# Patient Record
Sex: Male | Born: 1935
Health system: Southern US, Community
[De-identification: ages and names within clinical notes are randomized; demographics above are authoritative.]

## PROBLEM LIST (undated history)

## (undated) ENCOUNTER — Emergency Department (HOSPITAL_COMMUNITY): Admission: EM | Payer: Medicare Other | Source: Home / Self Care

## (undated) DIAGNOSIS — G478 Other sleep disorders: Secondary | ICD-10-CM

## (undated) DIAGNOSIS — J309 Allergic rhinitis, unspecified: Secondary | ICD-10-CM

## (undated) DIAGNOSIS — T8859XA Other complications of anesthesia, initial encounter: Secondary | ICD-10-CM

## (undated) DIAGNOSIS — E7800 Pure hypercholesterolemia, unspecified: Secondary | ICD-10-CM

## (undated) DIAGNOSIS — F419 Anxiety disorder, unspecified: Secondary | ICD-10-CM

## (undated) DIAGNOSIS — H5010 Unspecified exotropia: Secondary | ICD-10-CM

## (undated) DIAGNOSIS — Z8679 Personal history of other diseases of the circulatory system: Secondary | ICD-10-CM

## (undated) DIAGNOSIS — G8929 Other chronic pain: Secondary | ICD-10-CM

## (undated) DIAGNOSIS — M5431 Sciatica, right side: Secondary | ICD-10-CM

## (undated) DIAGNOSIS — E119 Type 2 diabetes mellitus without complications: Principal | ICD-10-CM

## (undated) DIAGNOSIS — Z96659 Presence of unspecified artificial knee joint: Secondary | ICD-10-CM

## (undated) DIAGNOSIS — N35919 Unspecified urethral stricture, male, unspecified site: Secondary | ICD-10-CM

## (undated) DIAGNOSIS — I10 Essential (primary) hypertension: Secondary | ICD-10-CM

## (undated) DIAGNOSIS — Z8719 Personal history of other diseases of the digestive system: Secondary | ICD-10-CM

## (undated) DIAGNOSIS — N401 Enlarged prostate with lower urinary tract symptoms: Secondary | ICD-10-CM

## (undated) DIAGNOSIS — M1990 Unspecified osteoarthritis, unspecified site: Secondary | ICD-10-CM

## (undated) DIAGNOSIS — E785 Hyperlipidemia, unspecified: Secondary | ICD-10-CM

## (undated) DIAGNOSIS — T4145XA Adverse effect of unspecified anesthetic, initial encounter: Secondary | ICD-10-CM

## (undated) DIAGNOSIS — M1712 Unilateral primary osteoarthritis, left knee: Secondary | ICD-10-CM

## (undated) HISTORY — PX: TONSILLECTOMY: SUR1361

## (undated) HISTORY — DX: Pure hypercholesterolemia, unspecified: E78.00

## (undated) HISTORY — PX: LUMBAR SPINE SURGERY: SHX701

## (undated) HISTORY — PX: TONSILLECTOMY AND ADENOIDECTOMY: SUR1326

## (undated) HISTORY — DX: Type 2 diabetes mellitus without complications: E11.9

## (undated) HISTORY — DX: Unilateral primary osteoarthritis, left knee: M17.12

## (undated) HISTORY — PX: SHOULDER ARTHROSCOPY: SHX128

## (undated) HISTORY — PX: KNEE ARTHROSCOPY: SUR90

## (undated) HISTORY — PX: TOTAL KNEE ARTHROPLASTY: SHX125

## (undated) HISTORY — PX: NISSEN FUNDOPLICATION: SHX2091

## (undated) HISTORY — PX: APPENDECTOMY: SHX54

## (undated) HISTORY — DX: Essential (primary) hypertension: I10

## (undated) HISTORY — PX: LAPAROSCOPIC NISSEN FUNDOPLICATION: SHX1932

## (undated) HISTORY — PX: CATARACT EXTRACTION W/ INTRAOCULAR LENS  IMPLANT, BILATERAL: SHX1307

## (undated) HISTORY — PX: CLOSED REDUCTION GREATER HUMERAL TUBEROSITY FRACTURE: SUR219

## (undated) HISTORY — DX: Presence of unspecified artificial knee joint: Z96.659

## (undated) HISTORY — PX: JOINT REPLACEMENT: SHX530

## (undated) HISTORY — PX: NASAL SEPTOPLASTY W/ TURBINOPLASTY: SHX2070

---

## 2000-05-23 ENCOUNTER — Ambulatory Visit (HOSPITAL_COMMUNITY): Admission: RE | Admit: 2000-05-23 | Discharge: 2000-05-23 | Payer: Self-pay | Admitting: Gastroenterology

## 2000-07-17 ENCOUNTER — Encounter: Admission: RE | Admit: 2000-07-17 | Discharge: 2000-07-17 | Payer: Self-pay | Admitting: Gastroenterology

## 2000-07-17 ENCOUNTER — Encounter: Payer: Self-pay | Admitting: Gastroenterology

## 2000-08-07 ENCOUNTER — Ambulatory Visit (HOSPITAL_COMMUNITY): Admission: RE | Admit: 2000-08-07 | Discharge: 2000-08-07 | Payer: Self-pay | Admitting: Gastroenterology

## 2000-10-04 ENCOUNTER — Ambulatory Visit (HOSPITAL_COMMUNITY): Admission: RE | Admit: 2000-10-04 | Discharge: 2000-10-04 | Payer: Self-pay | Admitting: Surgery

## 2000-10-04 ENCOUNTER — Encounter: Payer: Self-pay | Admitting: Surgery

## 2000-10-31 ENCOUNTER — Encounter: Payer: Self-pay | Admitting: Surgery

## 2000-11-07 ENCOUNTER — Inpatient Hospital Stay (HOSPITAL_COMMUNITY): Admission: RE | Admit: 2000-11-07 | Discharge: 2000-11-09 | Payer: Self-pay | Admitting: Surgery

## 2006-03-12 ENCOUNTER — Encounter: Admission: RE | Admit: 2006-03-12 | Discharge: 2006-03-12 | Payer: Self-pay | Admitting: Orthopedic Surgery

## 2006-03-19 ENCOUNTER — Ambulatory Visit (HOSPITAL_BASED_OUTPATIENT_CLINIC_OR_DEPARTMENT_OTHER): Admission: RE | Admit: 2006-03-19 | Discharge: 2006-03-19 | Payer: Self-pay | Admitting: Orthopedic Surgery

## 2007-05-13 ENCOUNTER — Inpatient Hospital Stay (HOSPITAL_COMMUNITY): Admission: RE | Admit: 2007-05-13 | Discharge: 2007-05-15 | Payer: Self-pay | Admitting: Orthopedic Surgery

## 2007-05-13 HISTORY — PX: TOTAL KNEE ARTHROPLASTY: SHX125

## 2008-03-24 HISTORY — PX: BACK SURGERY: SHX140

## 2008-03-30 ENCOUNTER — Inpatient Hospital Stay (HOSPITAL_COMMUNITY): Admission: EM | Admit: 2008-03-30 | Discharge: 2008-04-13 | Payer: Self-pay | Admitting: Emergency Medicine

## 2008-03-31 ENCOUNTER — Encounter (INDEPENDENT_AMBULATORY_CARE_PROVIDER_SITE_OTHER): Payer: Self-pay | Admitting: Internal Medicine

## 2008-04-07 ENCOUNTER — Ambulatory Visit: Payer: Self-pay | Admitting: Physical Medicine & Rehabilitation

## 2008-05-19 ENCOUNTER — Encounter: Admission: RE | Admit: 2008-05-19 | Discharge: 2008-05-19 | Payer: Self-pay | Admitting: Neurological Surgery

## 2008-06-01 ENCOUNTER — Encounter: Admission: RE | Admit: 2008-06-01 | Discharge: 2008-06-01 | Payer: Self-pay | Admitting: Neurological Surgery

## 2008-11-17 ENCOUNTER — Encounter: Admission: RE | Admit: 2008-11-17 | Discharge: 2008-11-17 | Payer: Self-pay | Admitting: Neurological Surgery

## 2009-07-06 ENCOUNTER — Emergency Department (HOSPITAL_COMMUNITY): Admission: EM | Admit: 2009-07-06 | Discharge: 2009-07-06 | Payer: Self-pay | Admitting: Emergency Medicine

## 2009-07-20 ENCOUNTER — Ambulatory Visit (HOSPITAL_COMMUNITY): Admission: RE | Admit: 2009-07-20 | Discharge: 2009-07-21 | Payer: Self-pay | Admitting: Orthopedic Surgery

## 2009-07-20 HISTORY — PX: OTHER SURGICAL HISTORY: SHX169
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
## 2010-07-17 LAB — BASIC METABOLIC PANEL
BUN: 17 mg/dL (ref 6–23)
BUN: 9 mg/dL (ref 6–23)
CO2: 24 mEq/L (ref 19–32)
CO2: 24 mEq/L (ref 19–32)
Calcium: 8.1 mg/dL — ABNORMAL LOW (ref 8.4–10.5)
Chloride: 106 mEq/L (ref 96–112)
Creatinine, Ser: 0.89 mg/dL (ref 0.4–1.5)
GFR calc non Af Amer: >60 mL/min (ref >60)
Glucose, Bld: 172 mg/dL — ABNORMAL HIGH (ref 70–99)
Glucose, Bld: 191 mg/dL — ABNORMAL HIGH (ref 70–99)
Potassium: 5 mEq/L (ref 3.5–5.1)

## 2010-07-17 LAB — CBC
HCT: 40.5 % (ref 39.0–52.0)
Hemoglobin: 11.9 g/dL — ABNORMAL LOW (ref 13.0–17.0)
MCHC: 33.8 g/dL (ref 30.0–36.0)
MCHC: 34.4 g/dL (ref 30.0–36.0)
MCV: 91.5 fL (ref 78.0–100.0)
Platelets: 181 10*3/uL (ref 150–400)
Platelets: 199 10*3/uL (ref 150–400)
RDW: 13.7 % (ref 11.5–15.5)
RDW: 13.9 % (ref 11.5–15.5)

## 2010-07-17 LAB — GLUCOSE, CAPILLARY: Glucose-Capillary: 164 mg/dL — ABNORMAL HIGH (ref 70–99)
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
## 2010-09-06 NOTE — Op Note (Signed)
Woodard, [name] NO.:  [id number]   MEDICAL RECORD NO.:  [id number]          PATIENT TYPE:  INP   LOCATION:  2899                         FACILITY:  [facility]   PHYSICIAN:  Elana Alm. Thurston Hole, M.D. DATE OF BIRTH:  March 14, 1932   DATE OF PROCEDURE:  05/13/2007  DATE OF DISCHARGE:                               OPERATIVE REPORT   PREOPERATIVE DIAGNOSIS:  Right knee degenerative joint disease.   POSTOPERATIVE DIAGNOSIS:  Right knee degenerative joint disease.   PROCEDURE:  Right total knee replacement using Dupuy cemented total knee  system with #5 cemented femur, #6 cemented tibia with 10 mm polyethylene  RP tibial spacer and 38 mm polyethylene cemented patella.   SURGEON:  Elana Alm. Thurston Hole, M.D.   ASSISTANT:  Julien Girt, P.A.   ANESTHESIA:  General anesthesia.   OPERATIVE TIME:  One hour and 20 minutes.   COMPLICATIONS:  None.   DESCRIPTION OF PROCEDURE:  [name] was brought to the operating room on  May 13, 2007, after a femoral nerve block was placed in the holding  room by anesthesia.  He was placed on the operating room table in the  supine position.  After being placed under general anesthesia, he had a  Foley catheter placed under sterile conditions.  He received Ancef 1  gram IV preoperatively for prophylaxis.  His right leg was then prepped  using sterile DuraPrep and draped using a sterile technique.  The leg  was exsanguinated and a thigh tourniquet elevated to 365 mm.  Initially  through a 15 cm longitudinal incision based over the patella, initial  exposure was made.  The underlying subcutaneous tissues were incised,  along with the skin incision.  A median arthrotomy was performed,  revealing an excessive amount of normal-appearing joint fluid.  The  articular surfaces were inspected.  He had grade 4 changes medially,  laterally and in the patellofemoral joint.  Osteophytes were removed  from the femoral condyles and tibial plateau.   The medial and lateral  meniscal remnants were removed, as well as the anterior cruciate  ligament.  An intra-medullary drill was then drilled up the femoral  canal, for placement of the distal femoral cutting jig, which was placed  in the appropriate amount of rotation and a distal 13 mm cut was made in  the appropriate amount of external rotation.  The distal femur was then  sized.  A #5 was found to be the appropriate size.  A #5 cutting jig was  placed in the appropriate amount of external rotation and then these  cuts were made.  The proximal tibia was then exposed.  The tibial spines  were removed with an oscillating saw.  An intra-medullary drill was  drilled down the tibial canal for placement of the proximal tibial  cutting jig which was placed in the appropriate amount of rotation and a  proximal 6 mm cut was made based off the medial or lower side.  Spacer  blocks were then placed in flexion and extension.  The 10 mm blocks gave  excellent balancing, excellent stability  and excellent correction of  selection in varus deformities.  Preoperative range of motion had been  from -5 to 125 degrees with mild varus deformity, normal patella  tracking.  His knee had been stable preoperatively.  The spacer blocks  showed excellent balancing and excellent correction of his flexion and  varus deformities.  The #6 trial tibial base plate was placed on the cut  tibial surface.  This gave an excellent fit and a keel cut was made.  A  #5 PCL box cutter was then placed on the distal femur and then these  cuts were made.  At this point the #5 femoral trial was placed and with  the #6 tibial base plate trial, and a 10 mm polyethylene tibial spacer,  the knee was reduced and taken through a range of motion from 0-125  degrees, with excellent stability and excellent correction of his  flexion and varus deformities and normal patella tracking.  A  resurfacing 10 mm cut was made for the patella and  then three locking  holes placed for a 38 mm patella.  The patella trial was placed and then  patellofemoral tracking was re-evaluated and found to be normal.  At  this point, it was felt that all the trial components were of excellent  size, fit and stability.  They were then removed and the knee was then  jet lavaged, irrigated with 3 liters of saline.  The proximal tibia was  then exposed and a #6 tibial base plate with cement backing was hammered  into position, with an excellent fit, with excess cement being removed  from around the edges.  A #5 femoral component with cement backing was  hammered into position, also with an excellent fit, with excess cement  being removed from around the edges.  The 10 mm polyethylene RP tibial  spacer was placed on the tibial base plate and the knee reduced and  taken through a range of motion from 0-125 degrees, with excellent  stability and excellent correction of his flexion and varus deformities.  The 38 mm polyethylene cement backed patella was then placed in its  position and held there with a clamp.  After the cement had hardened,  again the patellofemoral tracking was evaluated and found to be normal.  At this point it was felt that all the components were of excellent  size, fit and stability.  The patient had a large calcified tibial  tubercle and a portion of this was removed with the rongeurs and an  osteotome, preserving the patellar tendon attachment.  At this point,  then the tourniquet was released.  Hemostasis was obtained with the  cautery.  The wound was then re-irrigated with saline and then the  arthrotomy was closed with #1 Ethibond suture over two medium Hemovac  drains.  The area of the tibial tubercle calcific exostectomy was closed  with #0 Vicryl with a small amount of bone wax being placed over this  exostosis area.  The subcutaneous tissues were closed with #0 and #2-0  Vicryl.  The subcuticular layer was closed with #4-0  Monocryl.  Sterile  dressings and a long-leg splint applied.   The patient was the awakened, extubated and taken to the recovery room  in stable condition.  The needle and sponge counts were correct x2 at  the end of the case.  Neurovascular status normal postoperatively.      Robert A. Thurston Hole, M.D.  Electronically Signed     RAW/MEDQ  D:  05/13/2007  T:  05/13/2007  Job:  [id number]
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
## 2010-09-09 NOTE — Procedures (Signed)
[place]. [facility]  Patient:    [name], [name]                         MRN: [id number] Proc. Date: 08/07/00 Adm. Date:  [id number] Disc. Date: [id number] Attending:  Orland Mustard CC:         Lennon Alstrom. Felipa Eth, M.D.   Procedure Report  PROCEDURE PERFORMED:  Esophageal manometry.  ENDOSCOPIST:  Llana Aliment. Randa Evens, M.D.  INDICATIONS FOR PROCEDURE:  Persistent problems with esophageal reflux symptoms.  This is done to evaluate the esophageal motility and possible anticipation of an antireflux operation.  DESCRIPTION OF PROCEDURE:  The procedure was performed off of medications.  He has this performed at [facility]. [facility] manometry lab in the usual manner.  The [place] system was used. Results are as follows.  1 - Upper esophageal sphincter is poorly seen.  Pharyngeal spikes are present. 2 - Esophageal body.  Peristalsis is normal.  Mean pressure in 10 swallows is     138 mm.  Mean duration is 4.9 seconds.  The motility appeared normal on     the wet swallows presented for evaluation. 3 - Lower esophageal sphincter.  28 mm average pressure does appear to relax     with swallowing.  Was measured at 73% relaxation.  IMPRESSION:  Essentially normal manometry.  PLAN:   Will proceed with 24-hour pH probe at this time. DD:  08/23/00 TD:  08/23/00 Job: [id number]

## 2010-09-09 NOTE — Discharge Summary (Signed)
[facility]  Patient:    [name], [name]                         MRN: [id number] Adm. Date:  [id number] Disc. Date: [id number] Attending:  Katha Cabal CC:         Lennon Alstrom. Felipa Eth, M.D.  Celestine L. Randa Evens, M.D.   Discharge Summary  ADMISSION DIAGNOSIS:  Gastroesophageal reflux disease.  PROCEDURES:  July 17, laparoscopic Nissen fundoplication over #50 dilator with closure of his diaphragm.  COURSE IN HOSPITAL:  [name] is a 77 year old gentleman with significant nocturnal reflux.  He underwent the aforementioned laparoscopic Nissen and did well.  He was ready for discharge on July 19 at which time he was given ______ and Phenergan to take and was asked to return to the office in three weeks.  FINAL DIAGNOSIS:  Gastroesophageal reflux disease status post laparoscopic Nissen. DD:  11/23/00 TD:  11/23/00 Job: [id number]

## 2010-09-09 NOTE — Procedures (Signed)
[place]. [facility]  Patient:    [name], [name]                         MRN: [id number] Proc. Date: 08/07/00 Adm. Date:  [id number] Disc. Date: [id number] Attending:  Orland Mustard CC:         Lennon Alstrom. Felipa Eth, M.D.   Procedure Report  PROCEDURE PERFORMED:  24-hour pH monitoring.  ENDOSCOPIST:  Llana Aliment. Randa Evens, M.D.  INDICATIONS FOR PROCEDURE:  Patient with chest pain with some reflux symptoms. This procedure is done to determine the degree of reflux.  The patient had manometry done just before this and then had the [facility] 24-hour ambulatory pH probe placed.  Duration of the test was 23 hours and 34 minutes.  The results of the distal channel revealed reflux 4.9% of the time in the upright position and 5% of the time in the recumbent position.  There was one episode greater than five minutes. Total reflux time was 4.9%, only slightly above limits of normal.  Composite  score calculated was 33.9 with normal being less than 22.  IMPRESSION:  Mild esophageal reflux.  The patient had this done after being off the medication two to three days but this still did show reflux.  I think this ought to be the amount of reflux that could be adequately controlled with proton pump inhibitors.  PLAN:  Will double his proton pump inhibitor and see back in the office in the next several weeks. DD:  08/23/00 TD:  08/23/00 Job: [id number]

## 2010-09-09 NOTE — Op Note (Signed)
NAME:  [name], [name]                  ACCOUNT NO.:  [id number]   MEDICAL RECORD NO.:  [id number]          PATIENT TYPE:  AMB   LOCATION:  DSC                          FACILITY:  [facility]   PHYSICIAN:  Robert A. Thurston Hole, M.D. DATE OF BIRTH:  03-10-1932   DATE OF PROCEDURE:  03/20/2006  DATE OF DISCHARGE:                               OPERATIVE REPORT   PREOPERATIVE DIAGNOSIS:  Right knee medial and lateral meniscal tears  with chondromalacia and synovitis.   POSTOPERATIVE DIAGNOSIS:  Right knee medial and lateral meniscal tears  with chondromalacia and synovitis.   PROCEDURE:  1. Right knee EUA followed by arthroscopic partial medial lateral      meniscectomies.  2. Right knee chondroplasty with partial synovectomy.   SURGEON:  Gerrit Friends. Aldona Bar, M.D.   ASSISTANT:  None.   ANESTHESIA:  General.   OPERATIVE TIME:  30 minutes.   COMPLICATIONS:  None.   INDICATIONS FOR PROCEDURE:  [name] is a 77 year old gentleman who has  had 6 months of increasing right knee pain with exam and MRI documenting  meniscal tearing with chondromalacia and synovitis.  He has failed  conservative care and is now to undergo arthroscopy.   DESCRIPTION:  [name] was brought to operating room on March 19, 2006 and placed on the operative table in supine position.  After being  placed under general anesthesia, his right knee was examined.  He had  full range of motion with 1 to 2+ crepitation, knee stable to  ligamentous exam with normal patellar tracking.  The knee was sterilely  injected with 0.2% Marcaine with epinephrine.  The right leg was prepped  using sterile DuraPrep and draped using sterile technique.  Originally  through an anterolateral portal, the arthroscope with a pump attached  was placed and through an anteromedial portal an arthroscopic probe was  placed.  On initial inspection medial compartment he had 50-60% grade 3  chondromalacia which was debrided.  Medial meniscus tear 50-60%  posterior medial horn which was resected back to stable rim.  Intercondylar notch was inspected.  Anterior posterior cruciate  ligaments were normal.  Lateral compartment inspected, articular  cartilage showed 30-40% grade 3 chondromalacia which was debrided.  Lateral meniscus tear, posterior lateral and anterior horn of which 50%  was resected back to stable rim.  Patellofemoral joint showed 50% grade  3 chondromalacia which was debrided.  The patella tracked normally.  Moderate synovitis in medial lateral gutters were debrided otherwise  they are free of pathology.  After this was done, it is felt that all  pathology been satisfactorily addressed.  The instruments were removed.  Portals closed with 3-0 nylon suture and injected 0.25% Marcaine with  epinephrine and 4 mg morphine.  Sterile dressings applied.  The patient  awakened and taken to recovery room in stable condition.   FOLLOW-UP CARE:  [name] will be followed outpatient on Vicodin and  Naprosyn.  See him back in the office in a week for sutures out follow-  up.      Robert A. Thurston Hole, M.D.  Electronically Signed  RAW/MEDQ  D:  03/20/2006  T:  03/20/2006  Job:  [id number]

## 2010-09-09 NOTE — Procedures (Signed)
[facility]  Patient:    [name], [name]                         MRN: [id number] Proc. Date: 05/23/00 Adm. Date:  [id number] Attending:  Orland Mustard CC:         Lennon Alstrom. Felipa Eth, M.D.   Procedure Report  PROCEDURE:  Esophagogastroduodenoscopy.  MEDICATIONS:  Cetacaine spray, fentanyl 50 mcg, Versed 6 mg IV.  INDICATIONS:  Persistent esophageal reflux symptoms refractory to treatment with Nexium with nocturnal awakening and regurgitation.  INFORMED CONSENT:  The procedure had been explained to the patient and consent obtained.  DESCRIPTION OF PROCEDURE:  With the patient in the left lateral decubitus position, the Olympus adult video endoscope was inserted blindly into the esophagus and advanced under direct visualization.  The stomach was entered, pylorus identified and passed.  The duodenum, including the bulb and second portion, seen well and were unremarkable.  The scope was withdrawn back into the stomach, and the port channel, antrum were normal.  The fundus and cardia were seen well on retroflexed view and were normal.  There was a 2-3 cm hiatal hernia with a patulous GE junction.  There was no evidence of Barretts esophagus.  The Z-line was well defined.  The distal esophagus was free of inflammation or ulceration.  The proximal esophagus was seen well and was normal.  The scope was withdrawn, and the patient tolerated the procedure well.  ASSESSMENT:  Hiatal hernia with esophageal reflux.  PLAN:  We will continue Nexium.  We will add Reglan for his prokinetic effects. We will see back in the office.  We will also proceed with a colonoscopy at this time, as planned. DD:  05/23/00 TD:  05/23/00 Job: [id number]

## 2010-09-09 NOTE — Discharge Summary (Signed)
[name], [name]                  ACCOUNT NO.:  [id number]   MEDICAL RECORD NO.:  [id number]          PATIENT TYPE:  INP   LOCATION:  3018                         FACILITY:  [facility]   PHYSICIAN:  Larina Earthly, M.D.        DATE OF BIRTH:  Oct 02, 1931   DATE OF ADMISSION:  03/30/2008  DATE OF DISCHARGE:  04/13/2008                               DISCHARGE SUMMARY   ADDENDUM:  Please see the discharge summary dictated on April 08, 2008.   The patient's discharge originally scheduled for April 08, 2008 was  delayed or deferred secondary to increasing confusion and disorientation  on the day of planned discharge that was quite different from the day  prior to discharge.  Given the patient's recent surgery, head CT was  obtained which was negative for any acute changes.  Urinalysis was  obtained that was questionably positive and possibly contaminated by  epithelial cells.  Gram-stain did reveal Gram-positive cocci and  increased white blood cells.  The patient was empirically started on  Ancef by Neurosurgery and this was later changed over to Septra DS.  Diabetes and hypertension remained stable.  The patient continued to  work with physical and occupational therapy and his altered mental  status slowly improved to where he was orienting very easily but  confused at times.  When evaluated on April 13, 2008, the patient was  adamant that he go home and he was appropriate.  His notes with physical  therapy were reviewed and he was amendable to their discussion about  keeping safety with the use of a walker.  His vital signs were stable.  Diabetes was controlled, but somewhat labile on a sliding-scale insulin.  It is still a question whether his altered mental status was caused by  the longstanding effects and metabolites of pain medications and  anesthesia which he reported happening during previous surgeries versus  a questionable urinary tract infection.  His discharge medications  have  remained stable with the exception of adding Septra DS one p.o. b.i.d.  times a week with close followup on an outpatient basis as well as  discharged with Home Health physical and occupational therapy on  April 13, 2008.      Larina Earthly, M.D.  Electronically Signed     RA/MEDQ  D:  06/09/2008  T:  06/10/2008  Job:  [id number]

## 2010-09-09 NOTE — Discharge Summary (Signed)
NAME:  [name], [name] NO.:  [id number]   MEDICAL RECORD NO.:  [id number]          PATIENT TYPE:  INP   LOCATION:  5018                         FACILITY:  [facility]   PHYSICIAN:  Elana Alm. Thurston Hole, M.D. DATE OF BIRTH:  Dec 10, 1931   DATE OF ADMISSION:  05/13/2007  DATE OF DISCHARGE:  05/15/2007                               DISCHARGE SUMMARY   ADMITTING DIAGNOSES:  1. End-stage DJD right knee.  2. High cholesterol.   DISCHARGE DIAGNOSES:  1. End-stage DJD right knee.  2. High cholesterol.  3. Postop blood loss anemia.  4. Gastroesophageal reflux.   HISTORY OF PRESENT ILLNESS:  The patient is a 77 year old white male  with a history of end-stage DJD of his right knee.  He has failed  arthroscopic debridement and articular cortisone injections and anti-  inflammatories.  Risk, benefits and possible complications of a total  knee have been discussed with him and his wife.  They are without  question.   PROCEDURES IN HOUSE:  On May 13, 2007, the patient underwent a right  total knee replacement by Dr. Thurston Hole, a right femoral nerve block by  anesthesia and a postop auto vac transfusion in the recovery room.  He  tolerated all the procedures well and was placed on a CPM zero to 90  degrees.  Postop day #1, his hemoglobin was 12.4.  He was metabolically  stable with a slightly low sodium and slightly high glucose, but  completely asymptomatic with these he was neurovascularly intact and  progressed well with physical therapy.  Postop day #1, however he got up  to the sink with his wife's assistance, lost his balance, and gently  fell to the floor.  He was lifted back into bed.  Surgical wound was  well-approximated.  He had no new bruising, no new pain.  His blood  pressure was 94/61.  His pulse was 87.  His respirations were 18.  His  O2 sat was 85% on room air.  A stat H&H was done which showed his  hemoglobin to be stable with his hemoglobin being 11.8 and  hematocrit  34.9.  He continued to do well despite his dizziness postop day  #2.  The patient significantly improved complaining of no pain from his fall  on postop day #1, no dizziness.  He is alert but did have some confusion  overnight.  His wife stated that he usually got confused whenever he was  hospitalized in the evening time.  Postop day #2, patient ambulated 120  feet was supervision with his rolling walker with minimal complaints.  His IV was discontinued so he was given a Dulcolax suppository.  He was  discharged to home in stable condition, weightbearing as tolerated, on a  regular diet.   DISCHARGE MEDICATIONS:  1. Hydrocodone 10/325 one tablet q.4 h p.r.n. pain.  2. Robaxin 500 mg one q.6 h p.r.n. muscle spasm.  3. Xanax 0.25 mg one tablet q.8 h p.r.n. anxiety.  4. Coumadin 5 mg one and a half tablets every evening.  5. Lovenox 60 mg one injection  daily until his INR is 2.0 or higher.  6. Simvastatin 20 mg daily.  7. Aspirin 81 mg daily.   FOLLOWUP:  He will follow up with Dr. Thurston Hole on May 27, 2007.  He  will call with increased pain, increased redness, increased swelling or  increased drainage.      Kirstin Shepperson, P.A.      Robert A. Thurston Hole, M.D.  Electronically Signed    KS/MEDQ  D:  06/21/2007  T:  06/22/2007  Job:  [id number]

## 2010-09-09 NOTE — Procedures (Signed)
[facility]  Patient:    [name], [name]                         MRN: [id number] Proc. Date: 05/23/00 Adm. Date:  [id number] Attending:  Orland Mustard CC:         Lennon Alstrom. Felipa Eth, M.D.   Procedure Report  PROCEDURE:  Colonoscopy.  MEDICATIONS:  The patient received a total of fentanyl 50 mcg, Versed 6 mg for both procedures.  SCOPE:  Olympus adult video colonoscope.  INDICATIONS:  Colon cancer screening.  DESCRIPTION OF PROCEDURE:  Following the endoscopy, the patient was turned around, and a digital exam was performed of the rectum.  The Olympus adult video colonoscope was inserted and advanced under direct visualization.  The prep was marginal.  There were some areas of some very sticky stool.  We were able to advance to the hepatic flexure and after multiple position changes, were able to get around the hepatic flexure down into the ascending colon. The patient had a very large atonic colon, a very large atonic abdomen. Despite multiple maneuvers, we were never able to completely advance down into the cecum.  The ascending colon was seen in the distance.  No gross lesions were seen.  The scope was withdrawn.  The patient tolerated the procedure well and was maintained on low-flow oxygen for both procedures with no obvious problems.  ASSESSMENT:  Normal colonoscopy to the ascending colon.  PLAN:  We will see back in the office in two months and check his stools and see how his esophageal reflux is performing. DD:  05/23/00 TD:  05/23/00 Job: [id number]

## 2010-09-09 NOTE — Op Note (Signed)
[facility]  Patient:    [name], [name]                         MRN: [id number] Proc. Date: 11/07/00 Adm. Date:  [id number] Attending:  Katha Cabal CC:         Lennon Alstrom. Felipa Eth, M.D.  Kayden L. Randa Evens, M.D.   Operative Report  PREOPERATIVE DIAGNOSIS:  Longstanding gastroesophageal reflux.  POSTOPERATIVE DIAGNOSIS:  Longstanding gastroesophageal reflux.  OPERATION PERFORMED:  Laparoscopic Nissen fundoplication (three) and closure of the hiatus (two).  SURGEON:  Thornton Park. Daphine Deutscher, M.D.  ASSISTANT:  Earna Coder, M.D.  ANESTHESIA:  INDICATIONS FOR PROCEDURE:  The patient is a 77 year old man who has had problems with reflux for many years.  He had a preop work-up showing normal motility.  His heartburn is controlled with Nexium.  DESCRIPTION OF PROCEDURE:  He was taken to room 1 and given general anesthesia.  The abdomen was prepped with Betadine and draped sterilely.  I made a longitudinal incision above the umbilicus and went through a pursestring suture and inserted a Hasson cannula without difficulty. Following insufflation I went up to the xiphoid, placed a 5 mm trocar through which I placed a liver retractor.  I then placed a 10-11 in the left upper quadrant and two 10-11s in the right upper quadrant.  I first incised the gastrohepatic window and went up and identified the right crus and dissected the right crus completely and then carried this anteriorly and dissected the esophagus.  I mobilized the esophagus on the right side.  Next, I went over on the patients left side and mobilized the greater curvature and carried this up and dissected the left crus completely.  The stomach was stuck to the spleen tenaciously at the top part of the spleen. This was taken down with the harmonic scalpel.  No bleeding was seen.  I then dissected the retroesophageal space and created a nice window.  When I had completed this and mobilized  the esophagus, I did have a little hiatus below and I went ahead and closed that with two simple sutures approximating the crus.  A 50 dilator was then passed by the anesthesiologist and with that in place, I then created a wrap.  I grasped the cardiofundic region bringing it around and suturing it to the top part of the stomach anteriorly.  Three such sutures were placed.  The first one was then tied extracorporeal.  I then anchored it.  I then went above this and took a bite again using stomach, esophagus, stomach and tied that down intracorporally.  Then below this I took yet another bite with good purchases of stomach, esophagogastric junction and stomach.  These were all tied down and the top two had clips placed on them. The completed wrap had good color and is depicted in the chart.  The operation took about an hour and 45 minutes.  The patient seemed to tolerate the procedure well.  All port sites were injected with Marcaine.  The umbilical port was tied down under direct vision and repaired with a second suture of 0 Vicryl.  The abdomen was then deflated and the skin was approximated with 4-0 Vicryl, benzoin and Steri-Strips.  The patient tolerated the procedure well and was taken to the recovery room in satisfactory condition. DD:  11/07/00 TD:  11/07/00 Job: [id number]

## 2011-01-12 LAB — CBC
HCT: 36 — ABNORMAL LOW
Hemoglobin: 14.8
Hemoglobin: 11.6 — ABNORMAL LOW
Hemoglobin: 12.4 — ABNORMAL LOW
MCHC: 33.3
MCHC: 34.4
RBC: 5
RBC: 3.83 — ABNORMAL LOW
RBC: 4.06 — ABNORMAL LOW
RDW: 12.9

## 2011-01-12 LAB — COMPREHENSIVE METABOLIC PANEL
ALT: 24
AST: 22
Alkaline Phosphatase: 97
CO2: 27
Calcium: 9.2
GFR calc Af Amer: >60
Glucose, Bld: 127 — ABNORMAL HIGH
Potassium: 4.7
Sodium: 138
Total Protein: 6.7

## 2011-01-12 LAB — URINALYSIS, ROUTINE W REFLEX MICROSCOPIC
Glucose, UA: NEGATIVE
Ketones, ur: NEGATIVE
Nitrite: NEGATIVE
Specific Gravity, Urine: 1.012
pH: 6.5

## 2011-01-12 LAB — BASIC METABOLIC PANEL
CO2: 23
CO2: 29
Calcium: 7.8 — ABNORMAL LOW
Calcium: 8.1 — ABNORMAL LOW
GFR calc Af Amer: >60
GFR calc Af Amer: >60
GFR calc non Af Amer: >60
Potassium: 4.2
Potassium: 4.6
Sodium: 132 — ABNORMAL LOW
Sodium: 136

## 2011-01-12 LAB — HEMOGLOBIN AND HEMATOCRIT, BLOOD: Hemoglobin: 11.8 — ABNORMAL LOW

## 2011-01-12 LAB — PROTIME-INR
INR: 1.1
INR: 1.3

## 2011-01-12 LAB — DIFFERENTIAL
Basophils Relative: 1
Eosinophils Absolute: 0.2
Eosinophils Relative: 2
Lymphs Abs: 2.2
Monocytes Absolute: 0.5
Monocytes Relative: 7

## 2011-01-12 LAB — URINE CULTURE
Colony Count: NO GROWTH
Culture: NO GROWTH

## 2011-01-12 LAB — APTT: aPTT: 30

## 2011-01-12 LAB — TYPE AND SCREEN: Antibody Screen: NEGATIVE

## 2011-01-27 LAB — CULTURE, BLOOD (ROUTINE X 2)
Culture: NO GROWTH
Culture: NO GROWTH

## 2011-01-27 LAB — CBC
HCT: 34.8 % — ABNORMAL LOW (ref 39.0–52.0)
HCT: 35.7 % — ABNORMAL LOW (ref 39.0–52.0)
HCT: 38.6 % — ABNORMAL LOW (ref 39.0–52.0)
HCT: 40 % (ref 39.0–52.0)
Hemoglobin: 11.8 g/dL — ABNORMAL LOW (ref 13.0–17.0)
Hemoglobin: 12.4 g/dL — ABNORMAL LOW (ref 13.0–17.0)
MCHC: 32.7 g/dL (ref 30.0–36.0)
MCHC: 33.6 g/dL (ref 30.0–36.0)
MCHC: 34.7 g/dL (ref 30.0–36.0)
MCHC: 34.8 g/dL (ref 30.0–36.0)
MCV: 88.4 fL (ref 78.0–100.0)
MCV: 88.6 fL (ref 78.0–100.0)
MCV: 88.7 fL (ref 78.0–100.0)
MCV: 88.7 fL (ref 78.0–100.0)
MCV: 89.1 fL (ref 78.0–100.0)
MCV: 89.8 fL (ref 78.0–100.0)
Platelets: 139 10*3/uL — ABNORMAL LOW (ref 150–400)
Platelets: 158 10*3/uL (ref 150–400)
Platelets: 179 10*3/uL (ref 150–400)
Platelets: 215 10*3/uL (ref 150–400)
Platelets: 350 10*3/uL (ref 150–400)
RBC: 3.88 MIL/uL — ABNORMAL LOW (ref 4.22–5.81)
RBC: 3.91 MIL/uL — ABNORMAL LOW (ref 4.22–5.81)
RBC: 4.03 MIL/uL — ABNORMAL LOW (ref 4.22–5.81)
RBC: 4.22 MIL/uL (ref 4.22–5.81)
RDW: 13.1 % (ref 11.5–15.5)
RDW: 13.1 % (ref 11.5–15.5)
RDW: 13.2 % (ref 11.5–15.5)
WBC: 10.3 10*3/uL (ref 4.0–10.5)
WBC: 10.4 10*3/uL (ref 4.0–10.5)
WBC: 11 10*3/uL — ABNORMAL HIGH (ref 4.0–10.5)
WBC: 11.7 10*3/uL — ABNORMAL HIGH (ref 4.0–10.5)

## 2011-01-27 LAB — COMPREHENSIVE METABOLIC PANEL
ALT: 27 U/L (ref 0–53)
ALT: 27 U/L (ref 0–53)
AST: 18 U/L (ref 0–37)
AST: 20 U/L (ref 0–37)
AST: 20 U/L (ref 0–37)
AST: 25 U/L (ref 0–37)
Albumin: 2.9 g/dL — ABNORMAL LOW (ref 3.5–5.2)
Albumin: 3 g/dL — ABNORMAL LOW (ref 3.5–5.2)
Albumin: 3 g/dL — ABNORMAL LOW (ref 3.5–5.2)
Albumin: 3.2 g/dL — ABNORMAL LOW (ref 3.5–5.2)
Albumin: 3.9 g/dL (ref 3.5–5.2)
Alkaline Phosphatase: 58 U/L (ref 39–117)
Alkaline Phosphatase: 62 U/L (ref 39–117)
Alkaline Phosphatase: 88 U/L (ref 39–117)
BUN: 13 mg/dL (ref 6–23)
BUN: 14 mg/dL (ref 6–23)
BUN: 19 mg/dL (ref 6–23)
BUN: 20 mg/dL (ref 6–23)
CO2: 23 mEq/L (ref 19–32)
CO2: 26 mEq/L (ref 19–32)
Calcium: 8.5 mg/dL (ref 8.4–10.5)
Calcium: 8.5 mg/dL (ref 8.4–10.5)
Calcium: 8.6 mg/dL (ref 8.4–10.5)
Calcium: 8.7 mg/dL (ref 8.4–10.5)
Chloride: 101 mEq/L (ref 96–112)
Chloride: 103 mEq/L (ref 96–112)
Creatinine, Ser: 0.86 mg/dL (ref 0.4–1.5)
Creatinine, Ser: 0.89 mg/dL (ref 0.4–1.5)
Creatinine, Ser: 0.91 mg/dL (ref 0.4–1.5)
Creatinine, Ser: 1.02 mg/dL (ref 0.4–1.5)
GFR calc Af Amer: >60 mL/min (ref >60)
GFR calc Af Amer: >60 mL/min (ref >60)
GFR calc Af Amer: >60 mL/min (ref >60)
GFR calc non Af Amer: >60 mL/min (ref >60)
GFR calc non Af Amer: >60 mL/min (ref >60)
GFR calc non Af Amer: >60 mL/min (ref >60)
Glucose, Bld: 142 mg/dL — ABNORMAL HIGH (ref 70–99)
Potassium: 3.8 mEq/L (ref 3.5–5.1)
Potassium: 4.2 mEq/L (ref 3.5–5.1)
Potassium: 4.5 mEq/L (ref 3.5–5.1)
Sodium: 133 mEq/L — ABNORMAL LOW (ref 135–145)
Sodium: 138 mEq/L (ref 135–145)
Sodium: 141 mEq/L (ref 135–145)
Total Bilirubin: 0.4 mg/dL (ref 0.3–1.2)
Total Bilirubin: 0.7 mg/dL (ref 0.3–1.2)
Total Protein: 5.9 g/dL — ABNORMAL LOW (ref 6.0–8.3)
Total Protein: 6.4 g/dL (ref 6.0–8.3)
Total Protein: 6.6 g/dL (ref 6.0–8.3)

## 2011-01-27 LAB — GLUCOSE, CAPILLARY
Glucose-Capillary: 111 mg/dL — ABNORMAL HIGH (ref 70–99)
Glucose-Capillary: 125 mg/dL — ABNORMAL HIGH (ref 70–99)
Glucose-Capillary: 129 mg/dL — ABNORMAL HIGH (ref 70–99)
Glucose-Capillary: 133 mg/dL — ABNORMAL HIGH (ref 70–99)
Glucose-Capillary: 133 mg/dL — ABNORMAL HIGH (ref 70–99)
Glucose-Capillary: 137 mg/dL — ABNORMAL HIGH (ref 70–99)
Glucose-Capillary: 139 mg/dL — ABNORMAL HIGH (ref 70–99)
Glucose-Capillary: 140 mg/dL — ABNORMAL HIGH (ref 70–99)
Glucose-Capillary: 140 mg/dL — ABNORMAL HIGH (ref 70–99)
Glucose-Capillary: 142 mg/dL — ABNORMAL HIGH (ref 70–99)
Glucose-Capillary: 143 mg/dL — ABNORMAL HIGH (ref 70–99)
Glucose-Capillary: 147 mg/dL — ABNORMAL HIGH (ref 70–99)
Glucose-Capillary: 148 mg/dL — ABNORMAL HIGH (ref 70–99)
Glucose-Capillary: 148 mg/dL — ABNORMAL HIGH (ref 70–99)
Glucose-Capillary: 148 mg/dL — ABNORMAL HIGH (ref 70–99)
Glucose-Capillary: 149 mg/dL — ABNORMAL HIGH (ref 70–99)
Glucose-Capillary: 151 mg/dL — ABNORMAL HIGH (ref 70–99)
Glucose-Capillary: 151 mg/dL — ABNORMAL HIGH (ref 70–99)
Glucose-Capillary: 151 mg/dL — ABNORMAL HIGH (ref 70–99)
Glucose-Capillary: 152 mg/dL — ABNORMAL HIGH (ref 70–99)
Glucose-Capillary: 153 mg/dL — ABNORMAL HIGH (ref 70–99)
Glucose-Capillary: 155 mg/dL — ABNORMAL HIGH (ref 70–99)
Glucose-Capillary: 160 mg/dL — ABNORMAL HIGH (ref 70–99)
Glucose-Capillary: 162 mg/dL — ABNORMAL HIGH (ref 70–99)
Glucose-Capillary: 162 mg/dL — ABNORMAL HIGH (ref 70–99)
Glucose-Capillary: 172 mg/dL — ABNORMAL HIGH (ref 70–99)
Glucose-Capillary: 176 mg/dL — ABNORMAL HIGH (ref 70–99)
Glucose-Capillary: 179 mg/dL — ABNORMAL HIGH (ref 70–99)
Glucose-Capillary: 181 mg/dL — ABNORMAL HIGH (ref 70–99)
Glucose-Capillary: 181 mg/dL — ABNORMAL HIGH (ref 70–99)
Glucose-Capillary: 181 mg/dL — ABNORMAL HIGH (ref 70–99)
Glucose-Capillary: 201 mg/dL — ABNORMAL HIGH (ref 70–99)
Glucose-Capillary: 208 mg/dL — ABNORMAL HIGH (ref 70–99)
Glucose-Capillary: 224 mg/dL — ABNORMAL HIGH (ref 70–99)
Glucose-Capillary: 315 mg/dL — ABNORMAL HIGH (ref 70–99)
Glucose-Capillary: 82 mg/dL (ref 70–99)

## 2011-01-27 LAB — URINALYSIS, ROUTINE W REFLEX MICROSCOPIC
Bilirubin Urine: NEGATIVE
Glucose, UA: 100 mg/dL — AB
Glucose, UA: NEGATIVE mg/dL
Hgb urine dipstick: NEGATIVE
Hgb urine dipstick: NEGATIVE
Ketones, ur: 15 mg/dL — AB
Ketones, ur: >80 mg/dL — AB
Nitrite: NEGATIVE
Nitrite: NEGATIVE
Nitrite: NEGATIVE
Protein, ur: NEGATIVE mg/dL
Protein, ur: NEGATIVE mg/dL
Specific Gravity, Urine: 1.009 (ref 1.005–1.030)
Specific Gravity, Urine: 1.027 (ref 1.005–1.030)
Urobilinogen, UA: 0.2 mg/dL (ref 0.0–1.0)
Urobilinogen, UA: 0.2 mg/dL (ref 0.0–1.0)
Urobilinogen, UA: 0.2 mg/dL (ref 0.0–1.0)
Urobilinogen, UA: 1 mg/dL (ref 0.0–1.0)
pH: 6 (ref 5.0–8.0)

## 2011-01-27 LAB — BASIC METABOLIC PANEL
BUN: 13 mg/dL (ref 6–23)
BUN: 13 mg/dL (ref 6–23)
CO2: 24 mEq/L (ref 19–32)
Calcium: 8.3 mg/dL — ABNORMAL LOW (ref 8.4–10.5)
Calcium: 8.8 mg/dL (ref 8.4–10.5)
Chloride: 103 mEq/L (ref 96–112)
Chloride: 104 mEq/L (ref 96–112)
Chloride: 95 mEq/L — ABNORMAL LOW (ref 96–112)
Creatinine, Ser: 0.86 mg/dL (ref 0.4–1.5)
Creatinine, Ser: 0.88 mg/dL (ref 0.4–1.5)
Creatinine, Ser: 0.88 mg/dL (ref 0.4–1.5)
Creatinine, Ser: 0.98 mg/dL (ref 0.4–1.5)
GFR calc Af Amer: >60 mL/min (ref >60)
GFR calc Af Amer: >60 mL/min (ref >60)
GFR calc Af Amer: >60 mL/min (ref >60)
GFR calc non Af Amer: >60 mL/min (ref >60)
GFR calc non Af Amer: >60 mL/min (ref >60)
Glucose, Bld: 148 mg/dL — ABNORMAL HIGH (ref 70–99)
Potassium: 3.8 mEq/L (ref 3.5–5.1)
Potassium: 3.8 mEq/L (ref 3.5–5.1)
Potassium: 3.9 mEq/L (ref 3.5–5.1)
Sodium: 136 mEq/L (ref 135–145)

## 2011-01-27 LAB — URINE MICROSCOPIC-ADD ON

## 2011-01-27 LAB — DIFFERENTIAL
Basophils Relative: 1 % (ref 0–1)
Lymphs Abs: 1.4 10*3/uL (ref 0.7–4.0)
Monocytes Absolute: 0.5 10*3/uL (ref 0.1–1.0)
Monocytes Relative: 6 % (ref 3–12)
Neutro Abs: 5.9 10*3/uL (ref 1.7–7.7)

## 2011-01-27 LAB — GRAM STAIN

## 2011-01-27 LAB — ETHANOL: Alcohol, Ethyl (B): <5 mg/dL (ref 0–10)

## 2011-01-27 LAB — CK TOTAL AND CKMB (NOT AT ARMC): Relative Index: 2.1 (ref 0.0–2.5)

## 2011-01-27 LAB — URINE CULTURE

## 2011-01-27 LAB — RPR: RPR Ser Ql: NONREACTIVE

## 2011-01-27 LAB — TSH: TSH: 2.019 u[IU]/mL (ref 0.350–4.500)

## 2011-05-02 DIAGNOSIS — H2510 Age-related nuclear cataract, unspecified eye: Secondary | ICD-10-CM | POA: Diagnosis not present

## 2011-05-02 DIAGNOSIS — H43399 Other vitreous opacities, unspecified eye: Secondary | ICD-10-CM | POA: Diagnosis not present

## 2011-05-02 DIAGNOSIS — H43819 Vitreous degeneration, unspecified eye: Secondary | ICD-10-CM | POA: Diagnosis not present

## 2011-05-11 DIAGNOSIS — Z85828 Personal history of other malignant neoplasm of skin: Secondary | ICD-10-CM | POA: Diagnosis not present

## 2011-05-11 DIAGNOSIS — L570 Actinic keratosis: Secondary | ICD-10-CM | POA: Diagnosis not present

## 2011-05-11 DIAGNOSIS — L821 Other seborrheic keratosis: Secondary | ICD-10-CM | POA: Diagnosis not present

## 2011-06-09 DIAGNOSIS — I10 Essential (primary) hypertension: Secondary | ICD-10-CM | POA: Diagnosis not present

## 2011-06-09 DIAGNOSIS — E7800 Pure hypercholesterolemia, unspecified: Secondary | ICD-10-CM | POA: Diagnosis not present

## 2011-06-09 DIAGNOSIS — E119 Type 2 diabetes mellitus without complications: Secondary | ICD-10-CM | POA: Diagnosis not present

## 2011-10-18 DIAGNOSIS — I10 Essential (primary) hypertension: Secondary | ICD-10-CM | POA: Diagnosis not present

## 2011-10-18 DIAGNOSIS — Z125 Encounter for screening for malignant neoplasm of prostate: Secondary | ICD-10-CM | POA: Diagnosis not present

## 2011-10-18 DIAGNOSIS — E119 Type 2 diabetes mellitus without complications: Secondary | ICD-10-CM | POA: Diagnosis not present

## 2011-10-18 DIAGNOSIS — E7800 Pure hypercholesterolemia, unspecified: Secondary | ICD-10-CM | POA: Diagnosis not present

## 2011-10-24 DIAGNOSIS — Z1212 Encounter for screening for malignant neoplasm of rectum: Secondary | ICD-10-CM | POA: Diagnosis not present

## 2011-10-25 DIAGNOSIS — I10 Essential (primary) hypertension: Secondary | ICD-10-CM | POA: Diagnosis not present

## 2011-10-25 DIAGNOSIS — Z125 Encounter for screening for malignant neoplasm of prostate: Secondary | ICD-10-CM | POA: Diagnosis not present

## 2011-10-25 DIAGNOSIS — E119 Type 2 diabetes mellitus without complications: Secondary | ICD-10-CM | POA: Diagnosis not present

## 2011-10-25 DIAGNOSIS — E7800 Pure hypercholesterolemia, unspecified: Secondary | ICD-10-CM | POA: Diagnosis not present

## 2011-10-25 DIAGNOSIS — Z0000 Encounter for general adult medical examination without abnormal findings: Secondary | ICD-10-CM | POA: Diagnosis not present

## 2011-11-14 DIAGNOSIS — L821 Other seborrheic keratosis: Secondary | ICD-10-CM | POA: Diagnosis not present

## 2011-11-14 DIAGNOSIS — Z85828 Personal history of other malignant neoplasm of skin: Secondary | ICD-10-CM | POA: Diagnosis not present

## 2011-11-14 DIAGNOSIS — L570 Actinic keratosis: Secondary | ICD-10-CM | POA: Diagnosis not present

## 2012-01-17 DIAGNOSIS — IMO0002 Reserved for concepts with insufficient information to code with codable children: Secondary | ICD-10-CM | POA: Diagnosis not present

## 2012-01-17 DIAGNOSIS — M1710 Unilateral primary osteoarthritis, unspecified knee: Secondary | ICD-10-CM | POA: Diagnosis not present

## 2012-02-28 DIAGNOSIS — Z23 Encounter for immunization: Secondary | ICD-10-CM | POA: Diagnosis not present

## 2012-02-28 DIAGNOSIS — M1990 Unspecified osteoarthritis, unspecified site: Secondary | ICD-10-CM | POA: Diagnosis not present

## 2012-02-28 DIAGNOSIS — E119 Type 2 diabetes mellitus without complications: Secondary | ICD-10-CM | POA: Diagnosis not present

## 2012-02-28 DIAGNOSIS — J309 Allergic rhinitis, unspecified: Secondary | ICD-10-CM | POA: Diagnosis not present

## 2012-03-28 DIAGNOSIS — Z23 Encounter for immunization: Secondary | ICD-10-CM | POA: Diagnosis not present

## 2012-05-15 DIAGNOSIS — L570 Actinic keratosis: Secondary | ICD-10-CM | POA: Diagnosis not present

## 2012-05-15 DIAGNOSIS — L821 Other seborrheic keratosis: Secondary | ICD-10-CM | POA: Diagnosis not present

## 2012-07-11 DIAGNOSIS — E119 Type 2 diabetes mellitus without complications: Secondary | ICD-10-CM | POA: Diagnosis not present

## 2012-07-11 DIAGNOSIS — J309 Allergic rhinitis, unspecified: Secondary | ICD-10-CM | POA: Diagnosis not present

## 2012-07-11 DIAGNOSIS — I10 Essential (primary) hypertension: Secondary | ICD-10-CM | POA: Diagnosis not present

## 2012-07-11 DIAGNOSIS — M25569 Pain in unspecified knee: Secondary | ICD-10-CM | POA: Diagnosis not present

## 2012-07-15 DIAGNOSIS — M1710 Unilateral primary osteoarthritis, unspecified knee: Secondary | ICD-10-CM | POA: Diagnosis not present

## 2012-07-22 ENCOUNTER — Encounter (HOSPITAL_COMMUNITY): Payer: Self-pay | Admitting: Pharmacy Technician

## 2012-07-24 ENCOUNTER — Other Ambulatory Visit: Payer: Self-pay | Admitting: Physician Assistant

## 2012-07-24 ENCOUNTER — Encounter: Payer: Self-pay | Admitting: Physician Assistant

## 2012-07-24 DIAGNOSIS — E7800 Pure hypercholesterolemia, unspecified: Secondary | ICD-10-CM | POA: Insufficient documentation

## 2012-07-24 DIAGNOSIS — M1712 Unilateral primary osteoarthritis, left knee: Secondary | ICD-10-CM

## 2012-07-24 DIAGNOSIS — I10 Essential (primary) hypertension: Secondary | ICD-10-CM | POA: Insufficient documentation

## 2012-07-24 DIAGNOSIS — M1710 Unilateral primary osteoarthritis, unspecified knee: Secondary | ICD-10-CM | POA: Diagnosis not present

## 2012-07-24 DIAGNOSIS — Z96651 Presence of right artificial knee joint: Secondary | ICD-10-CM

## 2012-07-24 DIAGNOSIS — E119 Type 2 diabetes mellitus without complications: Secondary | ICD-10-CM | POA: Insufficient documentation

## 2012-07-24 NOTE — H&P (Signed)
TOTAL KNEE ADMISSION H&P  Patient is being admitted for left total knee arthroplasty.  Subjective:  Chief Complaint:left knee pain.  HPI: [name], 79 y.o. male, has a history of pain and functional disability in the left knee due to arthritis and has failed non-surgical conservative treatments for greater than 12 weeks to includeNSAID's and/or analgesics, corticosteriod injections, flexibility and strengthening excercises, supervised PT with diminished ADL's post treatment, use of assistive devices and activity modification.  Onset of symptoms was gradual, starting 5 years ago with gradually worsening course since that time. The patient noted no past surgery on the left knee(s).  Patient currently rates pain in the left knee(s) at 10 out of 10 with activity. Patient has night pain, worsening of pain with activity and weight bearing, pain that interferes with activities of daily living, crepitus and joint swelling.  Patient has evidence of subchondral sclerosis, periarticular osteophytes and joint space narrowing by imaging studies. There is no active infection.  Patient Active Problem List   Diagnosis Date Noted  . Diabetes mellitus   . Hypertension   . Hypercholesteremia   . Left knee DJD   . S/P total knee arthroplasty 05/13/2007   Past Medical History  Diagnosis Date  . Diabetes mellitus   . Hypertension   . Hypercholesteremia   . S/P total knee arthroplasty 05/13/2007  . Left knee DJD     Past Surgical History  Procedure Laterality Date  . Total knee arthroplasty Right 05/13/2007  . Back surgery  03/2008  . Left shoulder orif and rotator cuff repair  07/20/2009     (Not in a hospital admission) No Known Allergies   Current Outpatient Prescriptions on File Prior to Visit  Medication Sig Dispense Refill  . aspirin EC 81 MG tablet Take 81 mg by mouth daily.      . hydrochlorothiazide (MICROZIDE) 12.5 MG capsule Take 12.5 mg by mouth daily.      . metFORMIN (GLUCOPHAGE) 500  MG tablet Take 1,000 mg by mouth daily with breakfast.      . ramipril (ALTACE) 1.25 MG tablet Take 1.25 mg by mouth daily.      . simvastatin (ZOCOR) 20 MG tablet Take 20 mg by mouth every evening.      Marland Kitchen ibuprofen (ADVIL,MOTRIN) 200 MG tablet Take 200 mg by mouth every 6 (six) hours as needed for pain, fever or headache.       No current facility-administered medications on file prior to visit.    History  Substance Use Topics  . Smoking status: Not on file  . Smokeless tobacco: Not on file  . Alcohol Use: Not on file    Family History  Problem Relation Age of Onset  . Heart attack Father   . Stroke Maternal Grandfather   . Cancer    . Stroke Mother      Review of Systems  Constitutional: Negative.   HENT: Negative.   Eyes: Negative.   Respiratory: Negative.   Gastrointestinal: Negative.   Genitourinary: Negative.   Musculoskeletal: Positive for joint pain.       Left knee  Skin: Negative.   Neurological: Negative.   Endo/Heme/Allergies: Negative.     Objective:  Physical Exam  Constitutional: He is oriented to person, place, and time. He appears well-developed and well-nourished.  HENT:  Head: Normocephalic and atraumatic.  Eyes: Conjunctivae and EOM are normal. Pupils are equal, round, and reactive to light.  Neck: Neck supple.  Cardiovascular: Normal rate and regular rhythm.  Exam reveals no gallop and no friction rub.   No murmur heard. Respiratory: Effort normal and breath sounds normal.  GI: Soft.  Hyperactive bowel sounds  Genitourinary:  Not pertinent to current symptomatology therefore not examined.  Musculoskeletal:  Examination of his left knee reveals pain medially and laterally.  1+ crepitation.  1+ synovitis.  Range of motion from -5 to 125 degrees. Knee is stable with normal patella tracking. Examination of the right knee reveals well healed total knee incision without swelling or pain.  Full range of motion.  Knee is stable with normal patella  tracking.  Vascular exam: Pulses are 2+ and symmetric.    Neurological: He is alert and oriented to person, place, and time.  Skin: Skin is warm and dry.  Psychiatric: He has a normal mood and affect. His behavior is normal. Thought content normal.    Vital signs in last 24 hours: Last recorded: 04/02 0900   BP: 121/63 Pulse: 66  Temp: 97.6 F (36.4 C)    Height: 6' (1.829 m) SpO2: 92  Weight: 93.441 kg (206 lb)     Labs:   Estimated body mass index is 27.93 kg/(m^2) as calculated from the following:   Height as of this encounter: 6' (1.829 m).   Weight as of this encounter: 93.441 kg (206 lb).   Imaging Review Plain radiographs demonstrate severe degenerative joint disease of the left knee(s). The overall alignment ismild varus. The bone quality appears to be good for age and reported activity level.  Assessment/Plan:  End stage arthritis, left knee  Patient Active Problem List  Diagnosis  . Diabetes mellitus  . Hypertension  . Hypercholesteremia  . S/P total knee arthroplasty  . Left knee DJD    The patient history, physical examination, clinical judgment of the provider and imaging studies are consistent with end stage degenerative joint disease of the left knee(s) and total knee arthroplasty is deemed medically necessary. The treatment options including medical management, injection therapy arthroscopy and arthroplasty were discussed at length. The risks and benefits of total knee arthroplasty were presented and reviewed. The risks due to aseptic loosening, infection, stiffness, patella tracking problems, thromboembolic complications and other imponderables were discussed. The patient acknowledged the explanation, agreed to proceed with the plan and consent was signed. Patient is being admitted for inpatient treatment for surgery, pain control, PT, OT, prophylactic antibiotics, VTE prophylaxis, progressive ambulation and ADL's and discharge planning. The patient is  planning to be discharged home with home health services

## 2012-07-29 ENCOUNTER — Encounter (HOSPITAL_COMMUNITY)
Admission: RE | Admit: 2012-07-29 | Discharge: 2012-07-29 | Disposition: A | Discharge status: Home / Self Care | Payer: Medicare Other | Source: Ambulatory Visit | Attending: Physician Assistant | Admitting: Physician Assistant

## 2012-07-29 ENCOUNTER — Encounter (HOSPITAL_COMMUNITY)
Admission: RE | Admit: 2012-07-29 | Discharge: 2012-07-29 | Disposition: A | Discharge status: Home / Self Care | Payer: Medicare Other | Source: Ambulatory Visit | Attending: Orthopedic Surgery | Admitting: Orthopedic Surgery

## 2012-07-29 ENCOUNTER — Encounter (HOSPITAL_COMMUNITY): Payer: Self-pay

## 2012-07-29 DIAGNOSIS — M1710 Unilateral primary osteoarthritis, unspecified knee: Secondary | ICD-10-CM | POA: Diagnosis not present

## 2012-07-29 DIAGNOSIS — Z823 Family history of stroke: Secondary | ICD-10-CM | POA: Diagnosis not present

## 2012-07-29 DIAGNOSIS — E119 Type 2 diabetes mellitus without complications: Secondary | ICD-10-CM | POA: Diagnosis not present

## 2012-07-29 DIAGNOSIS — Z01812 Encounter for preprocedural laboratory examination: Secondary | ICD-10-CM | POA: Diagnosis not present

## 2012-07-29 DIAGNOSIS — E7800 Pure hypercholesterolemia, unspecified: Secondary | ICD-10-CM | POA: Diagnosis not present

## 2012-07-29 DIAGNOSIS — Z96659 Presence of unspecified artificial knee joint: Secondary | ICD-10-CM | POA: Diagnosis not present

## 2012-07-29 DIAGNOSIS — F411 Generalized anxiety disorder: Secondary | ICD-10-CM | POA: Diagnosis present

## 2012-07-29 DIAGNOSIS — I10 Essential (primary) hypertension: Secondary | ICD-10-CM | POA: Diagnosis not present

## 2012-07-29 DIAGNOSIS — J438 Other emphysema: Secondary | ICD-10-CM | POA: Diagnosis not present

## 2012-07-29 DIAGNOSIS — Z79899 Other long term (current) drug therapy: Secondary | ICD-10-CM | POA: Diagnosis not present

## 2012-07-29 DIAGNOSIS — M25569 Pain in unspecified knee: Secondary | ICD-10-CM | POA: Diagnosis not present

## 2012-07-29 DIAGNOSIS — Z8249 Family history of ischemic heart disease and other diseases of the circulatory system: Secondary | ICD-10-CM | POA: Diagnosis not present

## 2012-07-29 DIAGNOSIS — Z7982 Long term (current) use of aspirin: Secondary | ICD-10-CM | POA: Diagnosis not present

## 2012-07-29 DIAGNOSIS — G8918 Other acute postprocedural pain: Secondary | ICD-10-CM | POA: Diagnosis not present

## 2012-07-29 HISTORY — DX: Adverse effect of unspecified anesthetic, initial encounter: T41.45XA

## 2012-07-29 HISTORY — DX: Personal history of other diseases of the digestive system: Z87.19

## 2012-07-29 HISTORY — DX: Anxiety disorder, unspecified: F41.9

## 2012-07-29 HISTORY — DX: Other complications of anesthesia, initial encounter: T88.59XA

## 2012-07-29 LAB — URINALYSIS, ROUTINE W REFLEX MICROSCOPIC
Glucose, UA: NEGATIVE mg/dL
Hgb urine dipstick: NEGATIVE
Ketones, ur: NEGATIVE mg/dL
Leukocytes, UA: NEGATIVE
pH: 5.5 (ref 5.0–8.0)

## 2012-07-29 LAB — APTT: aPTT: 30 seconds (ref 24–37)

## 2012-07-29 LAB — CBC WITH DIFFERENTIAL/PLATELET
Basophils Absolute: 0.1 10*3/uL (ref 0.0–0.1)
HCT: 42.3 % (ref 39.0–52.0)
Hemoglobin: 14.7 g/dL (ref 13.0–17.0)
Lymphocytes Relative: 22 % (ref 12–46)
Lymphs Abs: 1.5 10*3/uL (ref 0.7–4.0)
MCV: 86.7 fL (ref 78.0–100.0)
Monocytes Absolute: 0.4 10*3/uL (ref 0.1–1.0)
Monocytes Relative: 6 % (ref 3–12)
Neutro Abs: 4.6 10*3/uL (ref 1.7–7.7)
RBC: 4.88 MIL/uL (ref 4.22–5.81)
WBC: 6.8 10*3/uL (ref 4.0–10.5)

## 2012-07-29 LAB — COMPREHENSIVE METABOLIC PANEL
AST: 21 U/L (ref 0–37)
CO2: 25 mEq/L (ref 19–32)
Chloride: 104 mEq/L (ref 96–112)
Creatinine, Ser: 0.95 mg/dL (ref 0.50–1.35)
GFR calc Af Amer: >90 mL/min (ref >90)
GFR calc non Af Amer: 78 mL/min — ABNORMAL LOW (ref >90)
Glucose, Bld: 127 mg/dL — ABNORMAL HIGH (ref 70–99)
Total Bilirubin: 0.2 mg/dL — ABNORMAL LOW (ref 0.3–1.2)

## 2012-07-29 LAB — PROTIME-INR: INR: 0.89 (ref 0.00–1.49)

## 2012-07-29 MED ORDER — POVIDONE-IODINE 7.5 % EX SOLN: Freq: Once | CUTANEOUS | Status: DC

## 2012-07-29 MED ORDER — TRANEXAMIC ACID 100 MG/ML IV SOLN: 1000.0000 mg | INTRAVENOUS | Status: DC

## 2012-07-29 MED ORDER — CHLORHEXIDINE GLUCONATE 4 % EX LIQD: 60.0000 mL | Freq: Once | CUTANEOUS | Status: DC

## 2012-07-29 NOTE — Progress Notes (Signed)
Dr Felipa Eth called for ekg

## 2012-07-29 NOTE — Pre-Procedure Instructions (Signed)
[name]  07/29/2012   Your procedure is scheduled on:  08/05/12  Report to [facility] at 830 AM.  Call this number if you have problems the morning of surgery: [phone]   Remember:   Do not eat food or drink liquids after midnight.   Take these medicines the morning of surgery with A SIP OF WATER: none   Do not wear jewelry, make-up or nail polish.  Do not wear lotions, powders, or perfumes. You may wear deodorant.  Do not shave 48 hours prior to surgery. Men may shave face and neck.  Do not bring valuables to the hospital.  Contacts, dentures or bridgework may not be worn into surgery.  Leave suitcase in the car. After surgery it may be brought to your room.  For patients admitted to the hospital, checkout time is 11:00 AM the day of  discharge.   Patients discharged the day of surgery will not be allowed to drive  home.  Name and phone number of your driver: family  Special Instructions: Shower using CHG 2 nights before surgery and the night before surgery.  If you shower the day of surgery use CHG.  Use special wash - you have one bottle of CHG for all showers.  You should use approximately 1/3 of the bottle for each shower.   Please read over the following fact sheets that you were given: Pain Booklet, Coughing and Deep Breathing, Blood Transfusion Information, MRSA Information and Surgical Site Infection Prevention

## 2012-08-04 MED ORDER — LACTATED RINGERS IV SOLN
INTRAVENOUS | Status: DC
  Administered 2012-08-05: 09:00:00 via INTRAVENOUS

## 2012-08-04 MED ORDER — CEFAZOLIN SODIUM-DEXTROSE 2-3 GM-% IV SOLR
2.0000 g | INTRAVENOUS | Status: AC
  Administered 2012-08-05: 2 g via INTRAVENOUS
  Filled 2012-08-04: qty 50

## 2012-08-05 ENCOUNTER — Inpatient Hospital Stay (HOSPITAL_COMMUNITY): Payer: Medicare Other | Admitting: Certified Registered Nurse Anesthetist

## 2012-08-05 ENCOUNTER — Encounter (HOSPITAL_COMMUNITY): Payer: Self-pay | Admitting: Certified Registered Nurse Anesthetist

## 2012-08-05 ENCOUNTER — Encounter (HOSPITAL_COMMUNITY)
Admission: RE | Disposition: A | Discharge status: Home Health | Payer: Self-pay | Source: Ambulatory Visit | Attending: Orthopedic Surgery

## 2012-08-05 ENCOUNTER — Inpatient Hospital Stay (HOSPITAL_COMMUNITY)
Admission: RE | Admit: 2012-08-05 | Discharge: 2012-08-06 | DRG: 470 | Disposition: A | Discharge status: Home Health | Payer: Medicare Other | Source: Ambulatory Visit | Attending: Orthopedic Surgery | Admitting: Orthopedic Surgery

## 2012-08-05 DIAGNOSIS — Z01812 Encounter for preprocedural laboratory examination: Secondary | ICD-10-CM

## 2012-08-05 DIAGNOSIS — I10 Essential (primary) hypertension: Secondary | ICD-10-CM | POA: Diagnosis not present

## 2012-08-05 DIAGNOSIS — E119 Type 2 diabetes mellitus without complications: Secondary | ICD-10-CM | POA: Diagnosis present

## 2012-08-05 DIAGNOSIS — Z8249 Family history of ischemic heart disease and other diseases of the circulatory system: Secondary | ICD-10-CM

## 2012-08-05 DIAGNOSIS — Z96659 Presence of unspecified artificial knee joint: Secondary | ICD-10-CM

## 2012-08-05 DIAGNOSIS — M1712 Unilateral primary osteoarthritis, left knee: Secondary | ICD-10-CM | POA: Diagnosis present

## 2012-08-05 DIAGNOSIS — E7800 Pure hypercholesterolemia, unspecified: Secondary | ICD-10-CM | POA: Diagnosis not present

## 2012-08-05 DIAGNOSIS — Z79899 Other long term (current) drug therapy: Secondary | ICD-10-CM

## 2012-08-05 DIAGNOSIS — G8918 Other acute postprocedural pain: Secondary | ICD-10-CM | POA: Diagnosis not present

## 2012-08-05 DIAGNOSIS — M25569 Pain in unspecified knee: Secondary | ICD-10-CM | POA: Diagnosis not present

## 2012-08-05 DIAGNOSIS — Z7982 Long term (current) use of aspirin: Secondary | ICD-10-CM

## 2012-08-05 DIAGNOSIS — M1710 Unilateral primary osteoarthritis, unspecified knee: Principal | ICD-10-CM | POA: Diagnosis present

## 2012-08-05 DIAGNOSIS — Z823 Family history of stroke: Secondary | ICD-10-CM

## 2012-08-05 DIAGNOSIS — F411 Generalized anxiety disorder: Secondary | ICD-10-CM | POA: Diagnosis present

## 2012-08-05 HISTORY — PX: TOTAL KNEE ARTHROPLASTY: SHX125

## 2012-08-05 LAB — CBC
HCT: 41.9 % (ref 39.0–52.0)
Hemoglobin: 14.4 g/dL (ref 13.0–17.0)
RBC: 4.8 MIL/uL (ref 4.22–5.81)
WBC: 9.5 10*3/uL (ref 4.0–10.5)

## 2012-08-05 LAB — GLUCOSE, CAPILLARY
Glucose-Capillary: 140 mg/dL — ABNORMAL HIGH (ref 70–99)
Glucose-Capillary: 182 mg/dL — ABNORMAL HIGH (ref 70–99)
Glucose-Capillary: 190 mg/dL — ABNORMAL HIGH (ref 70–99)

## 2012-08-05 LAB — CREATININE, SERUM
Creatinine, Ser: 0.91 mg/dL (ref 0.50–1.35)
GFR calc Af Amer: >90 mL/min (ref >90)
GFR calc non Af Amer: 79 mL/min — ABNORMAL LOW (ref >90)

## 2012-08-05 SURGERY — ARTHROPLASTY, KNEE, TOTAL
Anesthesia: General | Site: Knee | Laterality: Left | Wound class: Clean

## 2012-08-05 MED ORDER — ONDANSETRON HCL 4 MG/2ML IJ SOLN: 4.0000 mg | Freq: Four times a day (QID) | INTRAMUSCULAR | Status: DC | PRN

## 2012-08-05 MED ORDER — OXYCODONE HCL 5 MG PO TABS: 5.0000 mg | ORAL_TABLET | Freq: Once | ORAL | Status: DC | PRN

## 2012-08-05 MED ORDER — RAMIPRIL 1.25 MG PO TABS
1.2500 mg | ORAL_TABLET | Freq: Every day | ORAL | Status: DC
Start: 2012-08-05 — End: 2012-08-05

## 2012-08-05 MED ORDER — BUPIVACAINE-EPINEPHRINE PF 0.5-1:200000 % IJ SOLN
INTRAMUSCULAR | Status: DC | PRN
  Administered 2012-08-05: 150 mg

## 2012-08-05 MED ORDER — FENTANYL CITRATE 0.05 MG/ML IJ SOLN
INTRAMUSCULAR | Status: DC | PRN
  Administered 2012-08-05 (×4): 50 ug via INTRAVENOUS

## 2012-08-05 MED ORDER — METOCLOPRAMIDE HCL 5 MG/ML IJ SOLN: 5.0000 mg | Freq: Three times a day (TID) | INTRAMUSCULAR | Status: DC | PRN

## 2012-08-05 MED ORDER — PROPOFOL 10 MG/ML IV BOLUS
INTRAVENOUS | Status: DC | PRN
  Administered 2012-08-05: 200 mg via INTRAVENOUS

## 2012-08-05 MED ORDER — ENOXAPARIN SODIUM 30 MG/0.3ML ~~LOC~~ SOLN
30.0000 mg | Freq: Two times a day (BID) | SUBCUTANEOUS | Status: DC
  Administered 2012-08-06: 30 mg via SUBCUTANEOUS
  Filled 2012-08-05 (×3): qty 0.3

## 2012-08-05 MED ORDER — BISACODYL 5 MG PO TBEC
10.0000 mg | DELAYED_RELEASE_TABLET | Freq: Every day | ORAL | Status: DC
  Administered 2012-08-05: 10 mg via ORAL
  Filled 2012-08-05: qty 2

## 2012-08-05 MED ORDER — DOCUSATE SODIUM 100 MG PO CAPS
100.0000 mg | ORAL_CAPSULE | Freq: Two times a day (BID) | ORAL | Status: DC
  Administered 2012-08-05 – 2012-08-06 (×2): 100 mg via ORAL
  Filled 2012-08-05 (×3): qty 1

## 2012-08-05 MED ORDER — MIDAZOLAM HCL 2 MG/2ML IJ SOLN
INTRAMUSCULAR | Status: AC
  Filled 2012-08-05: qty 2

## 2012-08-05 MED ORDER — ACETAMINOPHEN 325 MG PO TABS: 650.0000 mg | ORAL_TABLET | Freq: Four times a day (QID) | ORAL | Status: DC | PRN

## 2012-08-05 MED ORDER — MENTHOL 3 MG MT LOZG: 1.0000 | LOZENGE | OROMUCOSAL | Status: DC | PRN

## 2012-08-05 MED ORDER — INSULIN ASPART 100 UNIT/ML ~~LOC~~ SOLN
0.0000 [IU] | Freq: Every day | SUBCUTANEOUS | Status: DC
  Administered 2012-08-05: 2 [IU] via SUBCUTANEOUS

## 2012-08-05 MED ORDER — HYDROMORPHONE HCL PF 1 MG/ML IJ SOLN: 0.5000 mg | INTRAMUSCULAR | Status: DC | PRN

## 2012-08-05 MED ORDER — ALUM & MAG HYDROXIDE-SIMETH 200-200-20 MG/5ML PO SUSP: 30.0000 mL | ORAL | Status: DC | PRN

## 2012-08-05 MED ORDER — TRANEXAMIC ACID 100 MG/ML IV SOLN
1000.0000 mg | INTRAVENOUS | Status: AC
  Administered 2012-08-05: 1000 mg via INTRAVENOUS
  Filled 2012-08-05: qty 10

## 2012-08-05 MED ORDER — ONDANSETRON HCL 4 MG/2ML IJ SOLN
INTRAMUSCULAR | Status: DC | PRN
  Administered 2012-08-05: 4 mg via INTRAVENOUS

## 2012-08-05 MED ORDER — ACETAMINOPHEN 650 MG RE SUPP: 650.0000 mg | Freq: Four times a day (QID) | RECTAL | Status: DC | PRN

## 2012-08-05 MED ORDER — DEXAMETHASONE SODIUM PHOSPHATE 4 MG/ML IJ SOLN
INTRAMUSCULAR | Status: DC | PRN
  Administered 2012-08-05: 10 mg

## 2012-08-05 MED ORDER — ZOLPIDEM TARTRATE 5 MG PO TABS
5.0000 mg | ORAL_TABLET | Freq: Every evening | ORAL | Status: DC | PRN
  Administered 2012-08-06: 5 mg via ORAL
  Filled 2012-08-05: qty 1

## 2012-08-05 MED ORDER — CEFUROXIME SODIUM 1.5 G IJ SOLR
INTRAMUSCULAR | Status: AC
  Filled 2012-08-05: qty 1.5

## 2012-08-05 MED ORDER — LIDOCAINE HCL (CARDIAC) 20 MG/ML IV SOLN
INTRAVENOUS | Status: DC | PRN
  Administered 2012-08-05: 60 mg via INTRAVENOUS

## 2012-08-05 MED ORDER — RAMIPRIL 1.25 MG PO CAPS
1.2500 mg | ORAL_CAPSULE | Freq: Every day | ORAL | Status: DC
  Administered 2012-08-06: 1.25 mg via ORAL
  Filled 2012-08-05: qty 1

## 2012-08-05 MED ORDER — DEXAMETHASONE SODIUM PHOSPHATE 10 MG/ML IJ SOLN
INTRAMUSCULAR | Status: DC | PRN
  Administered 2012-08-05: 10 mg via INTRAVENOUS

## 2012-08-05 MED ORDER — FENTANYL CITRATE 0.05 MG/ML IJ SOLN
INTRAMUSCULAR | Status: AC
  Administered 2012-08-05: 100 ug via INTRAVENOUS
  Filled 2012-08-05: qty 2

## 2012-08-05 MED ORDER — ACETAMINOPHEN 10 MG/ML IV SOLN
1000.0000 mg | Freq: Four times a day (QID) | INTRAVENOUS | Status: DC
  Administered 2012-08-05 – 2012-08-06 (×3): 1000 mg via INTRAVENOUS
  Filled 2012-08-05 (×4): qty 100

## 2012-08-05 MED ORDER — SODIUM CHLORIDE 0.9 % IR SOLN
Status: DC | PRN
  Administered 2012-08-05: 1000 mL
  Administered 2012-08-05: 3000 mL

## 2012-08-05 MED ORDER — OXYCODONE HCL 5 MG/5ML PO SOLN: 5.0000 mg | Freq: Once | ORAL | Status: DC | PRN

## 2012-08-05 MED ORDER — BUPIVACAINE-EPINEPHRINE 0.25% -1:200000 IJ SOLN
INTRAMUSCULAR | Status: DC | PRN
  Administered 2012-08-05: 30 mL

## 2012-08-05 MED ORDER — METOCLOPRAMIDE HCL 5 MG/ML IJ SOLN
INTRAMUSCULAR | Status: DC | PRN
  Administered 2012-08-05: 10 mg via INTRAVENOUS

## 2012-08-05 MED ORDER — OXYCODONE HCL 5 MG PO TABS: 5.0000 mg | ORAL_TABLET | ORAL | Status: DC | PRN

## 2012-08-05 MED ORDER — NEOSTIGMINE METHYLSULFATE 1 MG/ML IJ SOLN
INTRAMUSCULAR | Status: DC | PRN
  Administered 2012-08-05: 4 mg via INTRAVENOUS

## 2012-08-05 MED ORDER — BUPIVACAINE-EPINEPHRINE PF 0.25-1:200000 % IJ SOLN
INTRAMUSCULAR | Status: AC
  Filled 2012-08-05: qty 30

## 2012-08-05 MED ORDER — CEFUROXIME SODIUM 1.5 G IJ SOLR
INTRAMUSCULAR | Status: DC | PRN
  Administered 2012-08-05: 1.5 g

## 2012-08-05 MED ORDER — ONDANSETRON HCL 4 MG PO TABS: 4.0000 mg | ORAL_TABLET | Freq: Four times a day (QID) | ORAL | Status: DC | PRN

## 2012-08-05 MED ORDER — DEXAMETHASONE SODIUM PHOSPHATE 10 MG/ML IJ SOLN
10.0000 mg | Freq: Three times a day (TID) | INTRAMUSCULAR | Status: AC
  Filled 2012-08-05 (×3): qty 1

## 2012-08-05 MED ORDER — SIMVASTATIN 20 MG PO TABS
20.0000 mg | ORAL_TABLET | Freq: Every day | ORAL | Status: DC
  Administered 2012-08-05: 20 mg via ORAL
  Filled 2012-08-05 (×2): qty 1

## 2012-08-05 MED ORDER — POTASSIUM CHLORIDE IN NACL 20-0.9 MEQ/L-% IV SOLN
INTRAVENOUS | Status: DC
  Administered 2012-08-05: 15:00:00 via INTRAVENOUS
  Filled 2012-08-05 (×4): qty 1000

## 2012-08-05 MED ORDER — GLYCOPYRROLATE 0.2 MG/ML IJ SOLN
INTRAMUSCULAR | Status: DC | PRN
  Administered 2012-08-05: .6 mg via INTRAVENOUS

## 2012-08-05 MED ORDER — DIPHENHYDRAMINE HCL 12.5 MG/5ML PO ELIX: 12.5000 mg | ORAL_SOLUTION | ORAL | Status: DC | PRN

## 2012-08-05 MED ORDER — LACTATED RINGERS IV SOLN
INTRAVENOUS | Status: DC | PRN
  Administered 2012-08-05 (×2): via INTRAVENOUS

## 2012-08-05 MED ORDER — DEXAMETHASONE 6 MG PO TABS
10.0000 mg | ORAL_TABLET | Freq: Three times a day (TID) | ORAL | Status: AC
  Administered 2012-08-05 – 2012-08-06 (×3): 10 mg via ORAL
  Filled 2012-08-05 (×3): qty 1

## 2012-08-05 MED ORDER — PROMETHAZINE HCL 25 MG/ML IJ SOLN: 6.2500 mg | INTRAMUSCULAR | Status: DC | PRN

## 2012-08-05 MED ORDER — CELECOXIB 200 MG PO CAPS
200.0000 mg | ORAL_CAPSULE | Freq: Two times a day (BID) | ORAL | Status: DC
  Administered 2012-08-05 – 2012-08-06 (×2): 200 mg via ORAL
  Filled 2012-08-05 (×3): qty 1

## 2012-08-05 MED ORDER — METOCLOPRAMIDE HCL 5 MG PO TABS
5.0000 mg | ORAL_TABLET | Freq: Three times a day (TID) | ORAL | Status: DC | PRN
  Filled 2012-08-05: qty 2

## 2012-08-05 MED ORDER — FENTANYL CITRATE 0.05 MG/ML IJ SOLN: 100.0000 ug | Freq: Once | INTRAMUSCULAR | Status: AC

## 2012-08-05 MED ORDER — ROCURONIUM BROMIDE 100 MG/10ML IV SOLN
INTRAVENOUS | Status: DC | PRN
  Administered 2012-08-05: 50 mg via INTRAVENOUS

## 2012-08-05 MED ORDER — PHENOL 1.4 % MT LIQD: 1.0000 | OROMUCOSAL | Status: DC | PRN

## 2012-08-05 MED ORDER — INSULIN ASPART 100 UNIT/ML ~~LOC~~ SOLN
0.0000 [IU] | Freq: Three times a day (TID) | SUBCUTANEOUS | Status: DC
  Administered 2012-08-05: 3 [IU] via SUBCUTANEOUS
  Administered 2012-08-06: 5 [IU] via SUBCUTANEOUS

## 2012-08-05 MED ORDER — CEFAZOLIN SODIUM-DEXTROSE 2-3 GM-% IV SOLR
2.0000 g | Freq: Four times a day (QID) | INTRAVENOUS | Status: AC
  Administered 2012-08-05 (×2): 2 g via INTRAVENOUS
  Filled 2012-08-05 (×2): qty 50

## 2012-08-05 SURGICAL SUPPLY — 73 items
BANDAGE ESMARK 6X9 LF (GAUZE/BANDAGES/DRESSINGS) ×1 IMPLANT
BLADE SAGITTAL 25.0X1.19X90 (BLADE) ×2 IMPLANT
BLADE SAW SGTL 11.0X1.19X90.0M (BLADE) IMPLANT
BLADE SAW SGTL 13.0X1.19X90.0M (BLADE) ×2 IMPLANT
BLADE SURG 10 STRL SS (BLADE) ×4 IMPLANT
BNDG CMPR 9X6 STRL LF SNTH (GAUZE/BANDAGES/DRESSINGS) ×1
BNDG CMPR MED 15X6 ELC VLCR LF (GAUZE/BANDAGES/DRESSINGS) ×1
BNDG ELASTIC 6X15 VLCR STRL LF (GAUZE/BANDAGES/DRESSINGS) ×2 IMPLANT
BNDG ESMARK 6X9 LF (GAUZE/BANDAGES/DRESSINGS) ×2
BOWL SMART MIX CTS (DISPOSABLE) ×2 IMPLANT
CEMENT HV SMART SET (Cement) ×4 IMPLANT
CLOTH BEACON ORANGE TIMEOUT ST (SAFETY) ×2 IMPLANT
COVER SURGICAL LIGHT HANDLE (MISCELLANEOUS) ×2 IMPLANT
CUFF TOURNIQUET SINGLE 34IN LL (TOURNIQUET CUFF) ×2 IMPLANT
CUFF TOURNIQUET SINGLE 44IN (TOURNIQUET CUFF) IMPLANT
DRAPE EXTREMITY T 121X128X90 (DRAPE) ×2 IMPLANT
DRAPE INCISE IOBAN 66X45 STRL (DRAPES) ×2 IMPLANT
DRAPE PROXIMA HALF (DRAPES) ×2 IMPLANT
DRAPE U-SHAPE 47X51 STRL (DRAPES) ×2 IMPLANT
DRSG ADAPTIC 3X8 NADH LF (GAUZE/BANDAGES/DRESSINGS) ×2 IMPLANT
DRSG PAD ABDOMINAL 8X10 ST (GAUZE/BANDAGES/DRESSINGS) ×4 IMPLANT
DURAPREP 26ML APPLICATOR (WOUND CARE) ×2 IMPLANT
ELECT CAUTERY BLADE 6.4 (BLADE) ×2 IMPLANT
ELECT REM PT RETURN 9FT ADLT (ELECTROSURGICAL) ×2
ELECTRODE REM PT RTRN 9FT ADLT (ELECTROSURGICAL) ×1 IMPLANT
EVACUATOR 1/8 PVC DRAIN (DRAIN) ×2 IMPLANT
FACESHIELD LNG OPTICON STERILE (SAFETY) ×2 IMPLANT
GLOVE BIO SURGEON STRL SZ7 (GLOVE) ×2 IMPLANT
GLOVE BIO SURGEON STRL SZ7.5 (GLOVE) ×2 IMPLANT
GLOVE BIOGEL PI IND STRL 7.0 (GLOVE) ×1 IMPLANT
GLOVE BIOGEL PI IND STRL 7.5 (GLOVE) ×1 IMPLANT
GLOVE BIOGEL PI INDICATOR 7.0 (GLOVE) ×1
GLOVE BIOGEL PI INDICATOR 7.5 (GLOVE) ×1
GLOVE BIOGEL PI ORTHO PRO SZ7 (GLOVE) ×1
GLOVE ECLIPSE 7.0 STRL STRAW (GLOVE) ×1 IMPLANT
GLOVE ECLIPSE 7.5 STRL STRAW (GLOVE) ×1 IMPLANT
GLOVE PI ORTHO PRO STRL SZ7 (GLOVE) ×1 IMPLANT
GLOVE SS BIOGEL STRL SZ 7.5 (GLOVE) ×1 IMPLANT
GLOVE SUPERSENSE BIOGEL SZ 7.5 (GLOVE) ×1
GLOVE SURG SS PI 7.0 STRL IVOR (GLOVE) ×2 IMPLANT
GOWN PREVENTION PLUS XLARGE (GOWN DISPOSABLE) ×4 IMPLANT
GOWN STRL NON-REIN LRG LVL3 (GOWN DISPOSABLE) IMPLANT
GOWN STRL REIN XL XLG (GOWN DISPOSABLE) ×4 IMPLANT
HANDPIECE INTERPULSE COAX TIP (DISPOSABLE) ×2
HOOD PEEL AWAY FACE SHEILD DIS (HOOD) ×4 IMPLANT
IMMOBILIZER KNEE 22 UNIV (SOFTGOODS) ×1 IMPLANT
INSERT CUSHION PRONEVIEW LG (MISCELLANEOUS) ×2 IMPLANT
KIT BASIN OR (CUSTOM PROCEDURE TRAY) ×2 IMPLANT
KIT ROOM TURNOVER OR (KITS) ×2 IMPLANT
MANIFOLD NEPTUNE II (INSTRUMENTS) ×2 IMPLANT
NS IRRIG 1000ML POUR BTL (IV SOLUTION) ×2 IMPLANT
PACK TOTAL JOINT (CUSTOM PROCEDURE TRAY) ×2 IMPLANT
PAD ARMBOARD 7.5X6 YLW CONV (MISCELLANEOUS) ×4 IMPLANT
PAD CAST 4YDX4 CTTN HI CHSV (CAST SUPPLIES) ×1 IMPLANT
PADDING CAST COTTON 4X4 STRL (CAST SUPPLIES) ×2
PADDING CAST COTTON 6X4 STRL (CAST SUPPLIES) ×2 IMPLANT
POSITIONER HEAD PRONE TRACH (MISCELLANEOUS) ×2 IMPLANT
RUBBERBAND STERILE (MISCELLANEOUS) ×2 IMPLANT
SET HNDPC FAN SPRY TIP SCT (DISPOSABLE) ×1 IMPLANT
SPONGE GAUZE 4X4 12PLY (GAUZE/BANDAGES/DRESSINGS) ×2 IMPLANT
STRIP CLOSURE SKIN 1/2X4 (GAUZE/BANDAGES/DRESSINGS) ×2 IMPLANT
SUCTION FRAZIER TIP 10 FR DISP (SUCTIONS) ×2 IMPLANT
SUT ETHIBOND NAB CT1 #1 30IN (SUTURE) ×4 IMPLANT
SUT MNCRL AB 3-0 PS2 18 (SUTURE) ×2 IMPLANT
SUT VIC AB 0 CT1 27 (SUTURE) ×4
SUT VIC AB 0 CT1 27XBRD ANBCTR (SUTURE) ×2 IMPLANT
SUT VIC AB 2-0 CT1 27 (SUTURE) ×4
SUT VIC AB 2-0 CT1 TAPERPNT 27 (SUTURE) ×2 IMPLANT
SYR 30ML SLIP (SYRINGE) ×2 IMPLANT
TOWEL OR 17X24 6PK STRL BLUE (TOWEL DISPOSABLE) ×2 IMPLANT
TOWEL OR 17X26 10 PK STRL BLUE (TOWEL DISPOSABLE) ×2 IMPLANT
TRAY FOLEY CATH 14FR (SET/KITS/TRAYS/PACK) ×2 IMPLANT
WATER STERILE IRR 1000ML POUR (IV SOLUTION) ×4 IMPLANT

## 2012-08-05 NOTE — Anesthesia Postprocedure Evaluation (Signed)
Anesthesia Post Note  Patient: [name]  Procedure(s) Performed: Procedure(s) (LRB): TOTAL KNEE ARTHROPLASTY (Left)  Anesthesia type: General  Patient location: PACU  Post pain: Pain level controlled  Post assessment: Patient's Cardiovascular Status Stable  Last Vitals:  Filed Vitals:   08/05/12 1343  BP: 174/87  Pulse: 78  Temp:   Resp: 18    Post vital signs: Reviewed and stable  Level of consciousness: alert  Complications: No apparent anesthesia complications

## 2012-08-05 NOTE — Op Note (Signed)
MRN:     [id number] DOB/AGE:    June 27, 1931 / 79 y.o.       OPERATIVE REPORT    DATE OF PROCEDURE:  08/05/2012       PREOPERATIVE DIAGNOSIS:   DJD LEFT KNEE      There is no weight on file to calculate BMI.                                                        POSTOPERATIVE DIAGNOSIS:   DJD LEFT KNEE                                                                      PROCEDURE:  Procedure(s): TOTAL KNEE ARTHROPLASTY Using Depuy Sigma RP implants #5 Femur, #6Tibia, 10mm sigma RP bearing, 38 Patella     SURGEON: Gretchen Weinfeld A    ASSISTANT:  Kirstin Shepperson PA-C   (Present and scrubbed throughout the case, critical for assistance with exposure, retraction, instrumentation, and closure.)         ANESTHESIA: GET with Femoral Nerve Block  DRAINS: foley, 2 medium hemovac in knee   TOURNIQUET TIME:   COMPLICATIONS:  None     SPECIMENS: None   INDICATIONS FOR PROCEDURE: The patient has  DJD LEFT KNEE, varus deformities, XR shows bone on bone arthritis. Patient has failed all conservative measures including anti-inflammatory medicines, narcotics, attempts at  exercise and weight loss, cortisone injections and viscosupplementation.  Risks and benefits of surgery have been discussed, questions answered.   DESCRIPTION OF PROCEDURE: The patient identified by armband, received  right femoral nerve block and IV antibiotics, in the holding area at [facility]. Patient taken to the operating room, appropriate anesthetic  monitors were attached General endotracheal anesthesia induced with  the patient in supine position, Foley catheter was inserted. Tourniquet  applied high to the operative thigh. Lateral post and foot positioner  applied to the table, the lower extremity was then prepped and draped  in usual sterile fashion from the ankle to the tourniquet. Time-out procedure was performed. The limb was wrapped with an Esmarch bandage and the tourniquet inflated to 365 mmHg. We  began the operation by making the anterior midline incision starting at handbreadth above the patella going over the patella 1 cm medial to and  4 cm distal to the tibial tubercle. Small bleeders in the skin and the  subcutaneous tissue identified and cauterized. Transverse retinaculum was incised and reflected medially and a medial parapatellar arthrotomy was accomplished. the patella was everted and theprepatellar fat pad resected. The superficial medial collateral  ligament was then elevated from anterior to posterior along the proximal  flare of the tibia and anterior half of the menisci resected. The knee was hyperflexed exposing bone on bone arthritis. Peripheral and notch osteophytes as well as the cruciate ligaments were then resected. We continued to  work our way around posteriorly along the proximal tibia, and externally  rotated the tibia subluxing it out from underneath the femur. A McHale  retractor was placed through the notch and a lateral Personal assistant  placed, and we then drilled through the proximal tibia in line with the  axis of the tibia followed by an intramedullary guide rod and 2-degree  posterior slope cutting guide. The tibial cutting guide was pinned into place  allowing resection of 4 mm of bone medially and about 6 mm of bone  laterally because of her varus deformity. Satisfied with the tibial resection, we then  entered the distal femur 2 mm anterior to the PCL origin with the  intramedullary guide rod and applied the distal femoral cutting guide  set at 11mm, with 5 degrees of valgus. This was pinned along the  epicondylar axis. At this point, the distal femoral cut was accomplished without difficulty. We then sized for a #5 femoral component and pinned the guide in 3 degrees of external rotation.The chamfer cutting guide was pinned into place. The anterior, posterior, and chamfer cuts were accomplished without difficulty followed by  the Sigma RP box cutting  guide and the box cut. We also removed posterior osteophytes from the posterior femoral condyles. At this  time, the knee was brought into full extension. We checked our  extension and flexion gaps and found them symmetric at 10mm.  The patella thickness measured at 27 mm. We set the cutting guide at 17 and removed the posterior 9.5-10 mm  of the patella sized for 38 button and drilled the lollipop. The knee  was then once again hyperflexed exposing the proximal tibia. We sized for a #6 tibial base plate, applied the smokestack and the conical reamer followed by the the Delta fin keel punch. We then hammered into place the Sigma RP trial femoral component, inserted a 10-mm trial bearing, trial patellar button, and took the knee through range of motion from 0-130 degrees. No thumb pressure was required for patellar  tracking. At this point, all trial components were removed, a double batch of DePuy HV cement with 1500 mg of Zinacef was mixed and applied to all bony metallic mating surfaces except for the posterior condyles of the femur itself. In order, we  hammered into place the tibial tray and removed excess cement, the femoral component and removed excess cement, a 10-mm Sigma RP bearing  was inserted, and the knee brought to full extension with compression.  The patellar button was clamped into place, and excess cement  removed. While the cement cured the wound was irrigated out with normal saline solution pulse lavage, and medium Hemovac drains were placed.. Ligament stability and patellar tracking were checked and found to be excellent. The tourniquet was then released and hemostasis was obtained with cautery. The parapatellar arthrotomy was closed with  #1 ethibond suture. The subcutaneous tissue with 0 and 2-0 undyed  Vicryl suture, and 4-0 Monocryl.. A dressing of Xeroform,  4 x 4, dressing sponges, Webril, and Ace wrap applied. Needle and sponge count were correct times 2.The patient awakened,  extubated, and taken to recovery room without difficulty. Vascular status was normal, pulses 2+ and symmetric.   Rithy Mandley A 08/05/2012, 12:11 PM

## 2012-08-05 NOTE — H&P (View-Only) (Signed)
TOTAL KNEE ADMISSION H&P  Patient is being admitted for left total knee arthroplasty.  Subjective:  Chief Complaint:left knee pain.  HPI: [name], 80 y.o. male, has a history of pain and functional disability in the left knee due to arthritis and has failed non-surgical conservative treatments for greater than 12 weeks to includeNSAID's and/or analgesics, corticosteriod injections, flexibility and strengthening excercises, supervised PT with diminished ADL's post treatment, use of assistive devices and activity modification.  Onset of symptoms was gradual, starting 5 years ago with gradually worsening course since that time. The patient noted no past surgery on the left knee(s).  Patient currently rates pain in the left knee(s) at 10 out of 10 with activity. Patient has night pain, worsening of pain with activity and weight bearing, pain that interferes with activities of daily living, crepitus and joint swelling.  Patient has evidence of subchondral sclerosis, periarticular osteophytes and joint space narrowing by imaging studies. There is no active infection.  Patient Active Problem List   Diagnosis Date Noted  . Diabetes mellitus   . Hypertension   . Hypercholesteremia   . Left knee DJD   . S/P total knee arthroplasty 05/13/2007   Past Medical History  Diagnosis Date  . Diabetes mellitus   . Hypertension   . Hypercholesteremia   . S/P total knee arthroplasty 05/13/2007  . Left knee DJD     Past Surgical History  Procedure Laterality Date  . Total knee arthroplasty Right 05/13/2007  . Back surgery  03/2008  . Left shoulder orif and rotator cuff repair  07/20/2009     (Not in a hospital admission) No Known Allergies   Current Outpatient Prescriptions on File Prior to Visit  Medication Sig Dispense Refill  . aspirin EC 81 MG tablet Take 81 mg by mouth daily.      . hydrochlorothiazide (MICROZIDE) 12.5 MG capsule Take 12.5 mg by mouth daily.      . metFORMIN (GLUCOPHAGE) 500  MG tablet Take 1,000 mg by mouth daily with breakfast.      . ramipril (ALTACE) 1.25 MG tablet Take 1.25 mg by mouth daily.      . simvastatin (ZOCOR) 20 MG tablet Take 20 mg by mouth every evening.      . ibuprofen (ADVIL,MOTRIN) 200 MG tablet Take 200 mg by mouth every 6 (six) hours as needed for pain, fever or headache.       No current facility-administered medications on file prior to visit.    History  Substance Use Topics  . Smoking status: Not on file  . Smokeless tobacco: Not on file  . Alcohol Use: Not on file    Family History  Problem Relation Age of Onset  . Heart attack Father   . Stroke Maternal Grandfather   . Cancer    . Stroke Mother      Review of Systems  Constitutional: Negative.   HENT: Negative.   Eyes: Negative.   Respiratory: Negative.   Gastrointestinal: Negative.   Genitourinary: Negative.   Musculoskeletal: Positive for joint pain.       Left knee  Skin: Negative.   Neurological: Negative.   Endo/Heme/Allergies: Negative.     Objective:  Physical Exam  Constitutional: He is oriented to person, place, and time. He appears well-developed and well-nourished.  HENT:  Head: Normocephalic and atraumatic.  Eyes: Conjunctivae and EOM are normal. Pupils are equal, round, and reactive to light.  Neck: Neck supple.  Cardiovascular: Normal rate and regular rhythm.    Exam reveals no gallop and no friction rub.   No murmur heard. Respiratory: Effort normal and breath sounds normal.  GI: Soft.  Hyperactive bowel sounds  Genitourinary:  Not pertinent to current symptomatology therefore not examined.  Musculoskeletal:  Examination of his left knee reveals pain medially and laterally.  1+ crepitation.  1+ synovitis.  Range of motion from -5 to 125 degrees. Knee is stable with normal patella tracking. Examination of the right knee reveals well healed total knee incision without swelling or pain.  Full range of motion.  Knee is stable with normal patella  tracking.  Vascular exam: Pulses are 2+ and symmetric.    Neurological: He is alert and oriented to person, place, and time.  Skin: Skin is warm and dry.  Psychiatric: He has a normal mood and affect. His behavior is normal. Thought content normal.    Vital signs in last 24 hours: Last recorded: 04/02 0900   BP: 121/63 Pulse: 66  Temp: 97.6 F (36.4 C)    Height: 6' (1.829 m) SpO2: 92  Weight: 93.441 kg (206 lb)     Labs:   Estimated body mass index is 27.93 kg/(m^2) as calculated from the following:   Height as of this encounter: 6' (1.829 m).   Weight as of this encounter: 93.441 kg (206 lb).   Imaging Review Plain radiographs demonstrate severe degenerative joint disease of the left knee(s). The overall alignment ismild varus. The bone quality appears to be good for age and reported activity level.  Assessment/Plan:  End stage arthritis, left knee  Patient Active Problem List  Diagnosis  . Diabetes mellitus  . Hypertension  . Hypercholesteremia  . S/P total knee arthroplasty  . Left knee DJD    The patient history, physical examination, clinical judgment of the provider and imaging studies are consistent with end stage degenerative joint disease of the left knee(s) and total knee arthroplasty is deemed medically necessary. The treatment options including medical management, injection therapy arthroscopy and arthroplasty were discussed at length. The risks and benefits of total knee arthroplasty were presented and reviewed. The risks due to aseptic loosening, infection, stiffness, patella tracking problems, thromboembolic complications and other imponderables were discussed. The patient acknowledged the explanation, agreed to proceed with the plan and consent was signed. Patient is being admitted for inpatient treatment for surgery, pain control, PT, OT, prophylactic antibiotics, VTE prophylaxis, progressive ambulation and ADL's and discharge planning. The patient is  planning to be discharged home with home health services   

## 2012-08-05 NOTE — Transfer of Care (Signed)
Immediate Anesthesia Transfer of Care Note  Patient: [name]  Procedure(s) Performed: Procedure(s): TOTAL KNEE ARTHROPLASTY (Left)  Patient Location: PACU  Anesthesia Type:GA combined with regional for post-op pain  Level of Consciousness: awake and patient cooperative  Airway & Oxygen Therapy: Patient Spontanous Breathing and Patient connected to face mask oxygen  Post-op Assessment: Report given to PACU RN, Post -op Vital signs reviewed and stable and Patient moving all extremities X 4  Post vital signs: Reviewed and stable  Complications: No apparent anesthesia complications

## 2012-08-05 NOTE — Progress Notes (Signed)
Orthopedic Tech Progress Note Patient Details:  [name] 12-14-1931 [id number]  CPM Left Knee CPM Left Knee: Off Left Knee Flexion (Degrees): 60 Left Knee Extension (Degrees): 0   Lesle Chris 08/05/2012, 5:02 PM

## 2012-08-05 NOTE — Progress Notes (Signed)
UR COMPLETED  

## 2012-08-05 NOTE — Interval H&P Note (Signed)
History and Physical Interval Note:  08/05/2012 10:25 AM  [name]  has presented today for surgery, with the diagnosis of DJD LEFT KNEE  The various methods of treatment have been discussed with the patient and family. After consideration of risks, benefits and other options for treatment, the patient has consented to  Procedure(s): TOTAL KNEE ARTHROPLASTY (Left) as a surgical intervention .  The patient's history has been reviewed, patient examined, no change in status, stable for surgery.  I have reviewed the patient's chart and labs.  Questions were answered to the patient's satisfaction.     Salvatore Marvel A

## 2012-08-05 NOTE — Anesthesia Preprocedure Evaluation (Addendum)
Anesthesia Evaluation  Patient identified by MRN, date of birth, ID band Patient awake    Reviewed: Allergy & Precautions, H&P , NPO status , Patient's Chart, lab work & pertinent test results, reviewed documented beta blocker date and time   Airway Mallampati: II TM Distance: >3 FB Neck ROM: Full    Dental  (+) Dental Advisory Given   Pulmonary former smoker,  breath sounds clear to auscultation        Cardiovascular hypertension, Pt. on medications Rhythm:regular     Neuro/Psych Anxiety negative neurological ROS  negative psych ROS   GI/Hepatic Neg liver ROS, hiatal hernia,   Endo/Other  diabetes, Oral Hypoglycemic Agents  Renal/GU negative Renal ROS  negative genitourinary   Musculoskeletal   Abdominal   Peds  Hematology negative hematology ROS (+)   Anesthesia Other Findings History of confusion after lumbar surgery  See surgeon's H&P  Reproductive/Obstetrics negative OB ROS                         Anesthesia Physical Anesthesia Plan  ASA: III  Anesthesia Plan: General   Post-op Pain Management:    Induction: Intravenous  Airway Management Planned: Oral ETT and LMA  Additional Equipment:   Intra-op Plan:   Post-operative Plan: Extubation in OR  Informed Consent: I have reviewed the patients History and Physical, chart, labs and discussed the procedure including the risks, benefits and alternatives for the proposed anesthesia with the patient or authorized representative who has indicated his/her understanding and acceptance.   Dental advisory given  Plan Discussed with: CRNA, Anesthesiologist and Surgeon  Anesthesia Plan Comments:       Anesthesia Quick Evaluation

## 2012-08-05 NOTE — Anesthesia Procedure Notes (Addendum)
Anesthesia Regional Block:  Femoral nerve block  Pre-Anesthetic Checklist: ,, timeout performed, Correct Patient, Correct Site, Correct Laterality, Correct Procedure, Correct Position, site marked, Risks and benefits discussed,  Surgical consent,  Pre-op evaluation,  At surgeon's request and post-op pain management  Laterality: Left  Prep: chloraprep       Needles:  Injection technique: Single-shot  Needle Type: Echogenic Stimulator Needle     Needle Length: 5cm 5 cm Needle Gauge: 22 and 22 G    Additional Needles:  Procedures: ultrasound guided (picture in chart) and nerve stimulator Femoral nerve block  Nerve Stimulator or Paresthesia:  Response: quadraceps contraction, 0.45 mA,   Additional Responses:   Narrative:  Start time: 08/05/2012 9:42 AM End time: 08/05/2012 9:52 AM Injection made incrementally with aspirations every 5 mL.  Performed by: Personally  Anesthesiologist: Halford Decamp, MD  Additional Notes: Functioning IV was confirmed and monitors were applied.  A 50mm 22ga Arrow echogenic stimulator needle was used. Sterile prep and drape,hand hygiene and sterile gloves were used. Ultrasound guidance: relevant anatomy identified, needle position confirmed, local anesthetic spread visualized around nerve(s)., vascular puncture avoided.  Image printed for medical record. Negative aspiration and negative test dose prior to incremental administration of local anesthetic. The patient tolerated the procedure well.    Femoral nerve block Procedure Name: Intubation Date/Time: 08/05/2012 10:47 AM Performed by: Rogelia Boga Pre-anesthesia Checklist: Emergency Drugs available, Patient identified, Patient being monitored, Suction available and Timeout performed Patient Re-evaluated:Patient Re-evaluated prior to inductionOxygen Delivery Method: Circle system utilized Preoxygenation: Pre-oxygenation with 100% oxygen Intubation Type: IV induction Ventilation: Mask  ventilation without difficulty and Oral airway inserted - appropriate to patient size Laryngoscope Size: Mac and 4 Grade View: Grade II Tube type: Oral Tube size: 7.5 mm Number of attempts: 1 Airway Equipment and Method: Stylet Placement Confirmation: ETT inserted through vocal cords under direct vision,  positive ETCO2 and breath sounds checked- equal and bilateral Secured at: 23 cm Tube secured with: Tape Dental Injury: Teeth and Oropharynx as per pre-operative assessment

## 2012-08-05 NOTE — Progress Notes (Signed)
Orthopedic Tech Progress Note Patient Details:  [name] January 26, 1932 [id number] Set up and put on cpm at 6:30 pm. Patient ID: [name], male   DOB: Mar 26, 1932, 79 y.o.   MRN: [id number]   [name] 08/05/2012, 6:31 PM

## 2012-08-05 NOTE — Preoperative (Signed)
Beta Blockers   Reason not to administer Beta Blockers:Not Applicable 

## 2012-08-05 NOTE — Evaluation (Signed)
Physical Therapy Evaluation Patient Details Name: [name] MRN: [id number] DOB: 1931/06/28 Today's Date: 08/05/2012 Time: 4098-1191 PT Time Calculation (min): 23 min  PT Assessment / Plan / Recommendation Clinical Impression  Pt is a highly motivated 79 y.o. male s/p L TKA POD#0. Pt is moving very well Today. Hoping to D/C tomorrow with wife and HHPT. Pt presents with decreased mobility and strength secondary to surgery and pain. Will benefit from skilled PT to maximize functional mobility and return to PLOF. Recommend HHPT upon acute D/C; antcipate D/C home tomorrow. No DME needs at this time.    PT Assessment  Patient needs continued PT services    Follow Up Recommendations  Home health PT;Supervision - Intermittent    Does the patient have the potential to tolerate intense rehabilitation      Barriers to Discharge        Equipment Recommendations  None recommended by PT    Recommendations for Other Services OT consult   Frequency 7X/week    Precautions / Restrictions Precautions Precautions: Knee;Fall Precaution Booklet Issued: Yes (comment) Required Braces or Orthoses: Knee Immobilizer - Left Knee Immobilizer - Left: Discontinue once straight leg raise with < 10 degree lag Restrictions Weight Bearing Restrictions: Yes LLE Weight Bearing: Weight bearing as tolerated   Pertinent Vitals/Pain 2/10 in L knee; pt given ice pack to reduce pain and repositioned with pillows under ankles.       Mobility  Bed Mobility Bed Mobility: Supine to Sit;Sitting - Scoot to Edge of Bed Supine to Sit: 5: Supervision;With rails Sitting - Scoot to Edge of Bed: 6: Modified independent (Device/Increase time) Details for Bed Mobility Assistance: required cues for hand placement; able to andvance L LE indpendently; pt is impulsive  Transfers Transfers: Sit to Stand;Stand to Sit Sit to Stand: 4: Min guard;From bed;From elevated surface;With upper extremity assist Stand to Sit: 4: Min  guard;To chair/3-in-1;With armrests;With upper extremity assist Details for Transfer Assistance: cues for hand placement and RW safety;  has tendency to pull up to standing from walker; correctable with cues Ambulation/Gait Ambulation/Gait Assistance: 4: Min guard Ambulation Distance (Feet): 20 Feet Assistive device: Rolling walker Ambulation/Gait Assistance Details: min guard to steady; required vc's for upright posture and to increase heel strike on L LE; pt demo good technique and safety with RW Gait Pattern: Step-to pattern;Decreased stance time - left;Decreased step length - right;Trunk flexed;Antalgic Gait velocity: decreased Stairs: No Wheelchair Mobility Wheelchair Mobility: No    Exercises Total Joint Exercises Ankle Circles/Pumps: AROM;10 reps;Seated;Both Quad Sets: AROM;Left;10 reps;Seated   PT Diagnosis: Difficulty walking  PT Problem List: Decreased strength;Decreased range of motion;Decreased mobility;Decreased knowledge of use of DME;Pain PT Treatment Interventions: DME instruction;Gait training;Stair training;Functional mobility training;Therapeutic activities;Therapeutic exercise;Balance training;Neuromuscular re-education;Patient/family education   PT Goals Acute Rehab PT Goals PT Goal Formulation: With patient Time For Goal Achievement: 08/09/12 Potential to Achieve Goals: Good Pt will go Supine/Side to Sit: with modified independence PT Goal: Supine/Side to Sit - Progress: Goal set today Pt will go Sit to Supine/Side: with modified independence PT Goal: Sit to Supine/Side - Progress: Goal set today Pt will go Sit to Stand: with modified independence PT Goal: Sit to Stand - Progress: Goal set today Pt will go Stand to Sit: with modified independence PT Goal: Stand to Sit - Progress: Goal set today Pt will Transfer Bed to Chair/Chair to Bed: with modified independence PT Transfer Goal: Bed to Chair/Chair to Bed - Progress: Goal set today Pt will Ambulate: >150  feet;with modified  independence;with rolling walker PT Goal: Ambulate - Progress: Goal set today Pt will Go Up / Down Stairs: 6-9 stairs;with rail(s);with supervision PT Goal: Up/Down Stairs - Progress: Goal set today Pt will Perform Home Exercise Program: Independently PT Goal: Perform Home Exercise Program - Progress: Goal set today  Visit Information  Last PT Received On: 08/05/12 Assistance Needed: +1    Subjective Data  Subjective: I am ready to get out of this bed. Hoping to go home tomrrow.  Patient Stated Goal: to go home tomorrow and be at church on sunday   Prior Functioning  Home Living Lives With: Spouse Available Help at Discharge: Family;Available 24 hours/day Type of Home: House Home Access: Stairs to enter [employer] of Steps: 2 Entrance Stairs-Rails: Right Home Layout: Two level;1/2 bath on main level;Bed/bath upstairs Alternate Level Stairs-Number of Steps: 12 Alternate Level Stairs-Rails: Right Bathroom Shower/Tub: Engineer, manufacturing systems: Handicapped height Bathroom Accessibility: Yes How Accessible: Accessible via walker Home Adaptive Equipment: Walker - rolling;Shower chair with back Additional Comments: has all equipment from having R TKA in 2008 Prior Function Level of Independence: Independent Able to Take Stairs?: Yes Driving: Yes Vocation: Retired Musician: No difficulties Dominant Hand: Right    Cognition  Cognition Overall Cognitive Status: Appears within functional limits for tasks assessed/performed Arousal/Alertness: Awake/alert Orientation Level: Appears intact for tasks assessed Behavior During Session: [facility] for tasks performed    Extremity/Trunk Assessment Right Lower Extremity Assessment RLE ROM/Strength/Tone: Within functional levels RLE Sensation: WFL - Light Touch Left Lower Extremity Assessment LLE ROM/Strength/Tone: Within functional levels (DF and PF WFL; knee limited by pain and  surgery) LLE Sensation: WFL - Light Touch Trunk Assessment Trunk Assessment: Normal   Balance Balance Balance Assessed: No  End of Session CPM Left Knee CPM Left Knee: [address]  GP     Donnamarie Poag [place], [place] [phone] 08/05/2012, 2:50 PM

## 2012-08-06 ENCOUNTER — Encounter (HOSPITAL_COMMUNITY): Payer: Self-pay | Admitting: Orthopedic Surgery

## 2012-08-06 LAB — BASIC METABOLIC PANEL
BUN: 25 mg/dL — ABNORMAL HIGH (ref 6–23)
Calcium: 8.9 mg/dL (ref 8.4–10.5)
Chloride: 105 mEq/L (ref 96–112)
Creatinine, Ser: 1.04 mg/dL (ref 0.50–1.35)
GFR calc Af Amer: 77 mL/min — ABNORMAL LOW (ref >90)

## 2012-08-06 LAB — CBC
HCT: 37.5 % — ABNORMAL LOW (ref 39.0–52.0)
MCH: 29.4 pg (ref 26.0–34.0)
MCHC: 34.9 g/dL (ref 30.0–36.0)
MCV: 84.3 fL (ref 78.0–100.0)
Platelets: 166 10*3/uL (ref 150–400)
RDW: 13.4 % (ref 11.5–15.5)
WBC: 15 10*3/uL — ABNORMAL HIGH (ref 4.0–10.5)

## 2012-08-06 MED ORDER — CELECOXIB 200 MG PO CAPS: 200.0000 mg | ORAL_CAPSULE | Freq: Every day | ORAL | Status: DC

## 2012-08-06 MED ORDER — OXYCODONE HCL 5 MG PO TABS: ORAL_TABLET | ORAL | Status: DC

## 2012-08-06 MED ORDER — BISACODYL 5 MG PO TBEC: DELAYED_RELEASE_TABLET | ORAL | Status: DC

## 2012-08-06 MED ORDER — ASPIRIN 325 MG PO TBEC: DELAYED_RELEASE_TABLET | ORAL | Status: DC

## 2012-08-06 MED ORDER — DSS 100 MG PO CAPS: ORAL_CAPSULE | ORAL | Status: DC

## 2012-08-06 NOTE — Progress Notes (Signed)
Physical Therapy Treatment Patient Details Name: [name] MRN: [id number] DOB: [birth year] Today's Date: 08/06/2012 Time: 9562-1308 PT Time Calculation (min): 32 min  PT Assessment / Plan / Recommendation Comments on Treatment Session  Pt has made great progress with therapy. Anticipate D/C home Today. AROM in L Knee 2 to  90 degrees in sitting. Pt is clear from PT stand point to D/C home with HHPT.     Follow Up Recommendations  Home health PT;Supervision - Intermittent     Does the patient have the potential to tolerate intense rehabilitation     Barriers to Discharge        Equipment Recommendations  None recommended by PT    Recommendations for Other Services    Frequency 7X/week   Plan Discharge plan remains appropriate;Frequency remains appropriate    Precautions / Restrictions Precautions Precautions: Knee;Fall Required Braces or Orthoses: Knee Immobilizer - Left Knee Immobilizer - Left:  (Could be discontinued per PA) Restrictions Weight Bearing Restrictions: Yes LLE Weight Bearing: Weight bearing as tolerated   Pertinent Vitals/Pain Denies pain today. No complaints.     Mobility  Bed Mobility Bed Mobility: Not assessed Transfers Transfers: Sit to Stand;Stand to Sit Sit to Stand: 6: Modified independent (Device/Increase time);From chair/3-in-1;With armrests Stand to Sit: 6: Modified independent (Device/Increase time);With armrests;To chair/3-in-1 Details for Transfer Assistance: demo good technique with transfers and with safety  Ambulation/Gait Ambulation/Gait Assistance: 5: Supervision Ambulation Distance (Feet): 200 Feet Assistive device: Rolling walker Ambulation/Gait Assistance Details: cues to increase step length on L LE and equal weightshift; correctable with cues; vc's for upright posture and proper use of RW; pt tends to pick up RW to amb Gait Pattern: Step-to pattern;Step-through pattern;Decreased step length - left;Trunk flexed (progressed to  step through ) Gait velocity: decreased Stairs: Yes Stairs Assistance: 4: Min guard (to steady and vc's for gt sequencing) Stair Management Technique: One rail Right;Step to pattern;Forwards Number of Stairs: 12 Wheelchair Mobility Wheelchair Mobility: No    Exercises Total Joint Exercises Ankle Circles/Pumps: AROM;10 reps;Seated;Both Quad Sets: AROM;Left;10 reps;Seated Heel Slides: AAROM;Left;10 reps;Seated Straight Leg Raises: AAROM;10 reps;Left Long Arc Quad: AROM;10 reps;Seated Knee Flexion: AROM;10 reps;Left Goniometric ROM: AROM L knee 2 to 90 degrees in sitting   PT Diagnosis:    PT Problem List:   PT Treatment Interventions:     PT Goals Acute Rehab PT Goals PT Goal Formulation: With patient Time For Goal Achievement: 08/09/12 Potential to Achieve Goals: Good Pt will go Sit to Stand: Independently PT Goal: Sit to Stand - Progress: Updated due to goal met Pt will go Stand to Sit: Independently PT Goal: Stand to Sit - Progress: Updated due to goals met PT Transfer Goal: Bed to Chair/Chair to Bed - Progress: Progressing toward goal PT Goal: Ambulate - Progress: Progressing toward goal PT Goal: Up/Down Stairs - Progress: Progressing toward goal PT Goal: Perform Home Exercise Program - Progress: Progressing toward goal  Visit Information  Last PT Received On: 08/06/12 Assistance Needed: +1    Subjective Data  Subjective: I am going home as soon as you release me Patient Stated Goal: home today   Cognition  Cognition Overall Cognitive Status: Appears within functional limits for tasks assessed/performed Arousal/Alertness: Awake/alert Orientation Level: Appears intact for tasks assessed Behavior During Session: [facility] for tasks performed    Balance  Balance Balance Assessed: No  End of Session PT - End of Session Equipment Utilized During Treatment: Gait belt Activity Tolerance: Patient tolerated treatment well Patient left: in chair;with call  bell/phone within  reach Nurse Communication: Mobility status   GP     Donell Sievert, [place] [phone] 08/06/2012, 9:49 AM

## 2012-08-06 NOTE — Care Management Note (Signed)
CARE MANAGEMENT NOTE 08/06/2012  Patient:  [name], [name]   Account Number:  [id number]  Date Initiated:  08/06/2012  Documentation initiated by:  Vance Peper  Subjective/Objective Assessment:   79 yr old male s/p left total knee arthroplasty.     Action/Plan:   Patient preoperatively setup with [facility], no changes. Patient has rolling walker, 3in1 and CPm at home. Has family support at discharge.   Anticipated DC Date:  08/06/2012   Anticipated DC Plan:  HOME W HOME HEALTH SERVICES      DC Planning Services  CM consult      [facility] Choice  HOME HEALTH   Choice offered to / List presented to:          [facility] arranged  HH-2 PT      [facility] agency  [facility]   Status of service:  Completed, signed off Medicare Important Message given?   (If response is "NO", the following Medicare IM given date fields will be blank) Date Medicare IM given:   Date Additional Medicare IM given:    Discharge Disposition:  HOME W HOME HEALTH SERVICES  Per UR Regulation:    If discussed at Long Length of Stay Meetings, dates discussed:    Comments:

## 2012-08-06 NOTE — Discharge Summary (Signed)
Patient ID: [name] MRN: [id number] DOB/AGE: [birth year] 79 y.o.  Admit date: 08/05/2012 Discharge date: 08/06/2012  Admission Diagnoses:  Principal Problem:   Left knee DJD Active Problems:   Diabetes mellitus   Hypertension   Hypercholesteremia   S/P total knee arthroplasty   Discharge Diagnoses:  Same  Past Medical History  Diagnosis Date  . Diabetes mellitus   . Hypercholesteremia   . S/P total knee arthroplasty 05/13/2007  . Left knee DJD   . Complication of anesthesia     confusion  . Anxiety   . H/O hiatal hernia   . Hypertension     dr Felipa Eth     pcp    Surgeries: Procedure(s): TOTAL KNEE ARTHROPLASTY on 08/05/2012   Consultants:    Discharged Condition: Improved  Hospital Course: [name] is an 79 y.o. male who was admitted 08/05/2012 for operative treatment ofLeft knee DJD. Patient has severe unremitting pain that affects sleep, daily activities, and work/hobbies. After pre-op clearance the patient was taken to the operating room on 08/05/2012 and underwent  Procedure(s): TOTAL KNEE ARTHROPLASTY.    Patient was given perioperative antibiotics: Anti-infectives   Start     Dose/Rate Route Frequency Ordered Stop   08/05/12 1700  ceFAZolin (ANCEF) IVPB 2 g/50 mL premix     2 g 100 mL/hr over 30 Minutes Intravenous Every 6 hours 08/05/12 1251 08/05/12 2256   08/05/12 1116  cefUROXime (ZINACEF) injection  Status:  Discontinued       As needed 08/05/12 1118 08/05/12 1235   08/05/12 0600  ceFAZolin (ANCEF) IVPB 2 g/50 mL premix     2 g 100 mL/hr over 30 Minutes Intravenous On call to O.R. 08/04/12 1307 08/05/12 1040       Patient was given sequential compression devices, early ambulation, and chemoprophylaxis to prevent DVT.  Patient benefited maximally from hospital stay and there were no complications.    Recent vital signs: Patient Vitals for the past 24 hrs:  BP Temp Temp src Pulse Resp SpO2  08/06/12 0919 135/55 mmHg - - - - -  08/06/12 0400 -  - - - 18 99 %  08/06/12 0000 - - - - 16 -  08/05/12 2223 144/74 mmHg 98.2 F (36.8 C) - 90 18 95 %  08/05/12 2000 - - - - 20 98 %  08/05/12 1718 148/72 mmHg 98.1 F (36.7 C) - 81 18 96 %  08/05/12 1534 - - - - 18 -  08/05/12 1405 152/62 mmHg 97.2 F (36.2 C) Temporal 82 18 99 %  08/05/12 1345 - 97.2 F (36.2 C) - - - -  08/05/12 1343 174/87 mmHg - - 78 18 98 %  08/05/12 1328 136/63 mmHg - - 72 18 99 %  08/05/12 1313 169/82 mmHg - - 73 10 100 %  08/05/12 1258 173/82 mmHg - - 74 10 99 %  08/05/12 1245 - 97.1 F (36.2 C) - - - -  08/05/12 1243 158/81 mmHg - - 81 17 94 %  08/05/12 0947 157/77 mmHg - - 56 11 99 %     Recent laboratory studies:  Recent Labs  08/05/12 1330 08/06/12 0620  WBC 9.5 15.0*  HGB 14.4 13.1  HCT 41.9 37.5*  PLT 134* 166  NA  --  138  K  --  4.4  CL  --  105  CO2  --  20  BUN  --  25*  CREATININE 0.91 1.04  GLUCOSE  --  237*  CALCIUM  --  8.9     Discharge Medications:     Medication List    STOP taking these medications       ibuprofen 200 MG tablet  Commonly known as:  ADVIL,MOTRIN      TAKE these medications       aspirin 325 MG EC tablet  1 tablet 2 times a day for a month to prevent blood clots     bisacodyl 5 MG EC tablet  Commonly known as:  DULCOLAX  Take 2 tablets every night with dinner until bowel movement.  LAXITIVE.  Restart if two days since last bowel movement     celecoxib 200 MG capsule  Commonly known as:  CELEBREX  Take 1 capsule (200 mg total) by mouth daily.     DSS 100 MG Caps  1 tab 2 times a day while on narcotics.  STOOL SOFTENER     hydrochlorothiazide 12.5 MG capsule  Commonly known as:  MICROZIDE  Take 12.5 mg by mouth daily.     metFORMIN 500 MG tablet  Commonly known as:  GLUCOPHAGE  Take 1,000 mg by mouth daily with breakfast.     oxyCODONE 5 MG immediate release tablet  Commonly known as:  Oxy IR/ROXICODONE  1-2 tablets every 4-6 hrs as needed for pain     ramipril 1.25 MG tablet   Commonly known as:  ALTACE  Take 1.25 mg by mouth daily.     simvastatin 20 MG tablet  Commonly known as:  ZOCOR  Take 20 mg by mouth every evening.        Diagnostic Studies: Dg Chest 2 View  07/29/2012  *RADIOLOGY REPORT*  Clinical Data: Total knee arthroplasty.  History of diabetes, hiatal hernia, hypertension.  History of smoking.  CHEST - 2 VIEW  Comparison: 07/20/2009  Findings: The heart is mildly enlarged.  There are emphysematous changes in the apices.  Crowded lung markings are identified at the bases.  There are no focal consolidations or pleural effusions.  No pulmonary edema.  Degenerative changes are seen in the spine.  IMPRESSION:  1.  Cardiomegaly without pulmonary edema. 2.  Emphysema.   Original Report Authenticated By: Norva Pavlov, M.D.     Disposition:       Discharge Orders   Future Orders Complete By Expires     CPM  As directed     Comments:      Continuous passive motion machine (CPM):      Use the CPM from 0 to 90 for 6 hours per day.       You may break it up into 2 or 3 sessions per day.      Use CPM for 2 weeks or until you are told to stop.    Call MD / Call 911  As directed     Comments:      If you experience chest pain or shortness of breath, CALL 911 and be transported to the hospital emergency room.  If you develope a fever above 101 F, pus (white drainage) or increased drainage or redness at the wound, or calf pain, call your surgeon's office.    Change dressing  As directed     Comments:      Change the dressing daily with sterile 4 x 4 inch gauze dressing and apply TED hose.  You may clean the incision with alcohol prior to redressing.    Constipation Prevention  As directed     Comments:  Drink plenty of fluids.  Prune juice may be helpful.  You may use a stool softener, such as Colace (over the counter) 100 mg twice a day.  Use MiraLax (over the counter) for constipation as needed.    Diet - low sodium heart healthy  As directed      Discharge instructions  As directed     Comments:      Total Knee Replacement Care After Refer to this sheet in the next few weeks. These discharge instructions provide you with general information on caring for yourself after you leave the hospital. Your caregiver may also give you specific instructions. Your treatment has been planned according to the most current medical practices available, but unavoidable complications sometimes occur. If you have any problems or questions after discharge, please call your caregiver. Regaining a near full range of motion of your knee within the first 3 to 6 weeks after surgery is critical. HOME CARE INSTRUCTIONS  You may resume a normal diet and activities as directed.  Perform exercises as directed.  Place yellow foam block, yellow side up under heel at all times except when in CPM or when walking.  DO NOT modify, tear, cut, or change in any way. You will receive physical therapy daily  Take showers instead of baths until informed otherwise.  Change bandages (dressings)daily Do not take over-the-counter or prescription medicines for pain, discomfort, or fever. Eat a well-balanced diet.  Avoid lifting or driving until you are instructed otherwise.  Make an appointment to see your caregiver for stitches (suture) or staple removal as directed.  If you have been sent home with a continuous passive motion machine (CPM machine), 0-90 degrees 6 hrs a day   2 hrs a shift SEEK MEDICAL CARE IF: You have swelling of your calf or leg.  You develop shortness of breath or chest pain.  You have redness, swelling, or increasing pain in the wound.  There is pus or any unusual drainage coming from the surgical site.  You notice a bad smell coming from the surgical site or dressing.  The surgical site breaks open after sutures or staples have been removed.  There is persistent bleeding from the suture or staple line.  You are getting worse or are not improving.  You  have any other questions or concerns.  SEEK IMMEDIATE MEDICAL CARE IF:  You have a fever.  You develop a rash.  You have difficulty breathing.  You develop any reaction or side effects to medicines given.  Your knee motion is decreasing rather than improving.  MAKE SURE YOU:  Understand these instructions.  Will watch your condition.  Will get help right away if you are not doing well or get worse.    Do not put a pillow under the knee. Place it under the heel.  As directed     Comments:      Place gray foam block, curved side up under heel at all times except when in CPM or when walking.  DO NOT modify, tear, cut, or change in any way the gray foam block.    Increase activity slowly as tolerated  As directed     TED hose  As directed     Comments:      Use stockings (TED hose) for 2 weeks on both leg(s).  You may remove them at night for sleeping.       Follow-up Information   Follow up with Nilda Simmer, MD On 08/19/2012. (appt time  2pm)    Contact information:   [address] [place] [address] [phone]        Signed: Pascal Lux 08/06/2012, 9:40 AM

## 2012-08-06 NOTE — Progress Notes (Signed)
Discharge Note: Pt is alert and oriented, VS are stable, denies CP.   IV discontinued.  Discharge instructions reviewed with patient, pt verbalizes understanding.  Wheelchair transportation provided, all belongings with patient. Home health with PT set up, home equipment at bedside.

## 2012-08-08 DIAGNOSIS — M6281 Muscle weakness (generalized): Secondary | ICD-10-CM | POA: Diagnosis not present

## 2012-08-08 DIAGNOSIS — Z471 Aftercare following joint replacement surgery: Secondary | ICD-10-CM | POA: Diagnosis not present

## 2012-08-08 DIAGNOSIS — R262 Difficulty in walking, not elsewhere classified: Secondary | ICD-10-CM | POA: Diagnosis not present

## 2012-08-09 DIAGNOSIS — M6281 Muscle weakness (generalized): Secondary | ICD-10-CM | POA: Diagnosis not present

## 2012-08-09 DIAGNOSIS — Z471 Aftercare following joint replacement surgery: Secondary | ICD-10-CM | POA: Diagnosis not present

## 2012-08-09 DIAGNOSIS — R262 Difficulty in walking, not elsewhere classified: Secondary | ICD-10-CM | POA: Diagnosis not present

## 2012-08-12 DIAGNOSIS — M6281 Muscle weakness (generalized): Secondary | ICD-10-CM | POA: Diagnosis not present

## 2012-08-12 DIAGNOSIS — Z471 Aftercare following joint replacement surgery: Secondary | ICD-10-CM | POA: Diagnosis not present

## 2012-08-12 DIAGNOSIS — R262 Difficulty in walking, not elsewhere classified: Secondary | ICD-10-CM | POA: Diagnosis not present

## 2012-08-13 DIAGNOSIS — R262 Difficulty in walking, not elsewhere classified: Secondary | ICD-10-CM | POA: Diagnosis not present

## 2012-08-13 DIAGNOSIS — M6281 Muscle weakness (generalized): Secondary | ICD-10-CM | POA: Diagnosis not present

## 2012-08-13 DIAGNOSIS — Z471 Aftercare following joint replacement surgery: Secondary | ICD-10-CM | POA: Diagnosis not present

## 2012-08-14 DIAGNOSIS — Z471 Aftercare following joint replacement surgery: Secondary | ICD-10-CM | POA: Diagnosis not present

## 2012-08-14 DIAGNOSIS — M6281 Muscle weakness (generalized): Secondary | ICD-10-CM | POA: Diagnosis not present

## 2012-08-14 DIAGNOSIS — R262 Difficulty in walking, not elsewhere classified: Secondary | ICD-10-CM | POA: Diagnosis not present

## 2012-08-15 DIAGNOSIS — Z471 Aftercare following joint replacement surgery: Secondary | ICD-10-CM | POA: Diagnosis not present

## 2012-08-15 DIAGNOSIS — R262 Difficulty in walking, not elsewhere classified: Secondary | ICD-10-CM | POA: Diagnosis not present

## 2012-08-15 DIAGNOSIS — M6281 Muscle weakness (generalized): Secondary | ICD-10-CM | POA: Diagnosis not present

## 2012-08-17 DIAGNOSIS — M6281 Muscle weakness (generalized): Secondary | ICD-10-CM | POA: Diagnosis not present

## 2012-08-17 DIAGNOSIS — Z471 Aftercare following joint replacement surgery: Secondary | ICD-10-CM | POA: Diagnosis not present

## 2012-08-17 DIAGNOSIS — R262 Difficulty in walking, not elsewhere classified: Secondary | ICD-10-CM | POA: Diagnosis not present

## 2012-08-19 DIAGNOSIS — Z96659 Presence of unspecified artificial knee joint: Secondary | ICD-10-CM | POA: Diagnosis not present

## 2012-08-19 DIAGNOSIS — Z471 Aftercare following joint replacement surgery: Secondary | ICD-10-CM | POA: Diagnosis not present

## 2012-08-26 DIAGNOSIS — R269 Unspecified abnormalities of gait and mobility: Secondary | ICD-10-CM | POA: Diagnosis not present

## 2012-08-26 DIAGNOSIS — Z96659 Presence of unspecified artificial knee joint: Secondary | ICD-10-CM | POA: Diagnosis not present

## 2012-08-26 DIAGNOSIS — M25669 Stiffness of unspecified knee, not elsewhere classified: Secondary | ICD-10-CM | POA: Diagnosis not present

## 2012-08-28 DIAGNOSIS — R269 Unspecified abnormalities of gait and mobility: Secondary | ICD-10-CM | POA: Diagnosis not present

## 2012-08-28 DIAGNOSIS — M25669 Stiffness of unspecified knee, not elsewhere classified: Secondary | ICD-10-CM | POA: Diagnosis not present

## 2012-08-28 DIAGNOSIS — Z96659 Presence of unspecified artificial knee joint: Secondary | ICD-10-CM | POA: Diagnosis not present

## 2012-08-30 DIAGNOSIS — R269 Unspecified abnormalities of gait and mobility: Secondary | ICD-10-CM | POA: Diagnosis not present

## 2012-08-30 DIAGNOSIS — Z96659 Presence of unspecified artificial knee joint: Secondary | ICD-10-CM | POA: Diagnosis not present

## 2012-08-30 DIAGNOSIS — M25669 Stiffness of unspecified knee, not elsewhere classified: Secondary | ICD-10-CM | POA: Diagnosis not present

## 2012-09-02 DIAGNOSIS — Z96659 Presence of unspecified artificial knee joint: Secondary | ICD-10-CM | POA: Diagnosis not present

## 2012-09-02 DIAGNOSIS — M1710 Unilateral primary osteoarthritis, unspecified knee: Secondary | ICD-10-CM | POA: Diagnosis not present

## 2012-09-02 DIAGNOSIS — Z471 Aftercare following joint replacement surgery: Secondary | ICD-10-CM | POA: Diagnosis not present

## 2012-09-04 DIAGNOSIS — M1710 Unilateral primary osteoarthritis, unspecified knee: Secondary | ICD-10-CM | POA: Diagnosis not present

## 2012-09-04 DIAGNOSIS — Z471 Aftercare following joint replacement surgery: Secondary | ICD-10-CM | POA: Diagnosis not present

## 2012-09-04 DIAGNOSIS — Z96659 Presence of unspecified artificial knee joint: Secondary | ICD-10-CM | POA: Diagnosis not present

## 2012-09-09 DIAGNOSIS — M25669 Stiffness of unspecified knee, not elsewhere classified: Secondary | ICD-10-CM | POA: Diagnosis not present

## 2012-09-09 DIAGNOSIS — Z96659 Presence of unspecified artificial knee joint: Secondary | ICD-10-CM | POA: Diagnosis not present

## 2012-09-09 DIAGNOSIS — R269 Unspecified abnormalities of gait and mobility: Secondary | ICD-10-CM | POA: Diagnosis not present

## 2012-09-11 DIAGNOSIS — R269 Unspecified abnormalities of gait and mobility: Secondary | ICD-10-CM | POA: Diagnosis not present

## 2012-09-11 DIAGNOSIS — M25669 Stiffness of unspecified knee, not elsewhere classified: Secondary | ICD-10-CM | POA: Diagnosis not present

## 2012-09-11 DIAGNOSIS — Z96659 Presence of unspecified artificial knee joint: Secondary | ICD-10-CM | POA: Diagnosis not present

## 2012-09-13 DIAGNOSIS — R269 Unspecified abnormalities of gait and mobility: Secondary | ICD-10-CM | POA: Diagnosis not present

## 2012-09-13 DIAGNOSIS — Z96659 Presence of unspecified artificial knee joint: Secondary | ICD-10-CM | POA: Diagnosis not present

## 2012-09-13 DIAGNOSIS — M25669 Stiffness of unspecified knee, not elsewhere classified: Secondary | ICD-10-CM | POA: Diagnosis not present

## 2012-09-20 DIAGNOSIS — M25669 Stiffness of unspecified knee, not elsewhere classified: Secondary | ICD-10-CM | POA: Diagnosis not present

## 2012-09-20 DIAGNOSIS — Z96659 Presence of unspecified artificial knee joint: Secondary | ICD-10-CM | POA: Diagnosis not present

## 2012-09-20 DIAGNOSIS — Z471 Aftercare following joint replacement surgery: Secondary | ICD-10-CM | POA: Diagnosis not present

## 2012-09-25 DIAGNOSIS — R269 Unspecified abnormalities of gait and mobility: Secondary | ICD-10-CM | POA: Diagnosis not present

## 2012-09-25 DIAGNOSIS — Z96659 Presence of unspecified artificial knee joint: Secondary | ICD-10-CM | POA: Diagnosis not present

## 2012-09-25 DIAGNOSIS — M25669 Stiffness of unspecified knee, not elsewhere classified: Secondary | ICD-10-CM | POA: Diagnosis not present

## 2012-11-13 DIAGNOSIS — L820 Inflamed seborrheic keratosis: Secondary | ICD-10-CM | POA: Diagnosis not present

## 2012-11-13 DIAGNOSIS — E1169 Type 2 diabetes mellitus with other specified complication: Secondary | ICD-10-CM | POA: Diagnosis not present

## 2012-11-13 DIAGNOSIS — L570 Actinic keratosis: Secondary | ICD-10-CM | POA: Diagnosis not present

## 2012-11-13 DIAGNOSIS — Z125 Encounter for screening for malignant neoplasm of prostate: Secondary | ICD-10-CM | POA: Diagnosis not present

## 2012-11-13 DIAGNOSIS — I10 Essential (primary) hypertension: Secondary | ICD-10-CM | POA: Diagnosis not present

## 2012-11-13 DIAGNOSIS — L821 Other seborrheic keratosis: Secondary | ICD-10-CM | POA: Diagnosis not present

## 2012-11-13 DIAGNOSIS — Z85828 Personal history of other malignant neoplasm of skin: Secondary | ICD-10-CM | POA: Diagnosis not present

## 2012-11-13 DIAGNOSIS — E7800 Pure hypercholesterolemia, unspecified: Secondary | ICD-10-CM | POA: Diagnosis not present

## 2012-11-19 DIAGNOSIS — Z1212 Encounter for screening for malignant neoplasm of rectum: Secondary | ICD-10-CM | POA: Diagnosis not present

## 2012-11-20 DIAGNOSIS — J309 Allergic rhinitis, unspecified: Secondary | ICD-10-CM | POA: Diagnosis not present

## 2012-11-20 DIAGNOSIS — Z125 Encounter for screening for malignant neoplasm of prostate: Secondary | ICD-10-CM | POA: Diagnosis not present

## 2012-11-20 DIAGNOSIS — E1169 Type 2 diabetes mellitus with other specified complication: Secondary | ICD-10-CM | POA: Diagnosis not present

## 2012-11-20 DIAGNOSIS — K5900 Constipation, unspecified: Secondary | ICD-10-CM | POA: Diagnosis not present

## 2012-11-20 DIAGNOSIS — N400 Enlarged prostate without lower urinary tract symptoms: Secondary | ICD-10-CM | POA: Diagnosis not present

## 2012-11-20 DIAGNOSIS — I10 Essential (primary) hypertension: Secondary | ICD-10-CM | POA: Diagnosis not present

## 2012-11-20 DIAGNOSIS — M1990 Unspecified osteoarthritis, unspecified site: Secondary | ICD-10-CM | POA: Diagnosis not present

## 2012-11-20 DIAGNOSIS — Z1331 Encounter for screening for depression: Secondary | ICD-10-CM | POA: Diagnosis not present

## 2012-11-20 DIAGNOSIS — Z0000 Encounter for general adult medical examination without abnormal findings: Secondary | ICD-10-CM | POA: Diagnosis not present

## 2012-11-20 DIAGNOSIS — R972 Elevated prostate specific antigen [PSA]: Secondary | ICD-10-CM | POA: Diagnosis not present

## 2012-11-27 DIAGNOSIS — R109 Unspecified abdominal pain: Secondary | ICD-10-CM | POA: Diagnosis not present

## 2012-11-27 DIAGNOSIS — R32 Unspecified urinary incontinence: Secondary | ICD-10-CM | POA: Diagnosis not present

## 2012-11-27 DIAGNOSIS — K5900 Constipation, unspecified: Secondary | ICD-10-CM | POA: Diagnosis not present

## 2012-11-27 DIAGNOSIS — N281 Cyst of kidney, acquired: Secondary | ICD-10-CM | POA: Diagnosis not present

## 2012-11-27 DIAGNOSIS — Z6828 Body mass index (BMI) 28.0-28.9, adult: Secondary | ICD-10-CM | POA: Diagnosis not present

## 2012-11-27 DIAGNOSIS — J189 Pneumonia, unspecified organism: Secondary | ICD-10-CM | POA: Diagnosis not present

## 2012-12-03 DIAGNOSIS — E119 Type 2 diabetes mellitus without complications: Secondary | ICD-10-CM | POA: Insufficient documentation

## 2012-12-03 DIAGNOSIS — J189 Pneumonia, unspecified organism: Secondary | ICD-10-CM | POA: Insufficient documentation

## 2012-12-03 DIAGNOSIS — R040 Epistaxis: Secondary | ICD-10-CM | POA: Diagnosis not present

## 2012-12-03 DIAGNOSIS — R51 Headache: Secondary | ICD-10-CM | POA: Insufficient documentation

## 2012-12-03 DIAGNOSIS — I10 Essential (primary) hypertension: Secondary | ICD-10-CM | POA: Insufficient documentation

## 2012-12-04 ENCOUNTER — Emergency Department (HOSPITAL_COMMUNITY)
Admission: EM | Admit: 2012-12-04 | Discharge: 2012-12-04 | Discharge status: Against Medical Advice | Payer: Medicare Other | Attending: Emergency Medicine | Admitting: Emergency Medicine

## 2012-12-04 ENCOUNTER — Encounter (HOSPITAL_COMMUNITY): Payer: Self-pay | Admitting: *Deleted

## 2012-12-04 DIAGNOSIS — K5900 Constipation, unspecified: Secondary | ICD-10-CM | POA: Diagnosis not present

## 2012-12-04 DIAGNOSIS — Z6827 Body mass index (BMI) 27.0-27.9, adult: Secondary | ICD-10-CM | POA: Diagnosis not present

## 2012-12-04 DIAGNOSIS — R040 Epistaxis: Secondary | ICD-10-CM | POA: Diagnosis not present

## 2012-12-04 DIAGNOSIS — J189 Pneumonia, unspecified organism: Secondary | ICD-10-CM | POA: Diagnosis not present

## 2012-12-04 NOTE — ED Notes (Signed)
The pt is c/o a nosebleed from his rt nostril.  He woke up just a little while ago from his sleep with the nosebleed just pta.  He has his nose pinched off and there does not appear to be any bleeding at present.  He has a history of the same thing.  He has a headache also and is currently being treated for pneumonia

## 2012-12-04 NOTE — ED Notes (Signed)
NURSE FIRST: patient ambulatory to nurse first desk. States "I've waited as long as I'm going to wait. My nose has stopped bleeding and I'm going home." informed patient that he is welcomed to come back at anytime should he change his mind. Patient AAOx4, resp e/u, ambulatory with steady gait and NAD noted. Patient left with wife.

## 2012-12-26 DIAGNOSIS — J189 Pneumonia, unspecified organism: Secondary | ICD-10-CM | POA: Diagnosis not present

## 2012-12-26 DIAGNOSIS — Z6828 Body mass index (BMI) 28.0-28.9, adult: Secondary | ICD-10-CM | POA: Diagnosis not present

## 2012-12-26 DIAGNOSIS — I10 Essential (primary) hypertension: Secondary | ICD-10-CM | POA: Diagnosis not present

## 2012-12-26 DIAGNOSIS — K5900 Constipation, unspecified: Secondary | ICD-10-CM | POA: Diagnosis not present

## 2012-12-30 DIAGNOSIS — Z471 Aftercare following joint replacement surgery: Secondary | ICD-10-CM | POA: Diagnosis not present

## 2012-12-30 DIAGNOSIS — Z96659 Presence of unspecified artificial knee joint: Secondary | ICD-10-CM | POA: Diagnosis not present

## 2013-03-25 DIAGNOSIS — R972 Elevated prostate specific antigen [PSA]: Secondary | ICD-10-CM | POA: Diagnosis not present

## 2013-03-25 DIAGNOSIS — E1169 Type 2 diabetes mellitus with other specified complication: Secondary | ICD-10-CM | POA: Diagnosis not present

## 2013-04-01 DIAGNOSIS — K5900 Constipation, unspecified: Secondary | ICD-10-CM | POA: Diagnosis not present

## 2013-04-01 DIAGNOSIS — Z6829 Body mass index (BMI) 29.0-29.9, adult: Secondary | ICD-10-CM | POA: Diagnosis not present

## 2013-04-01 DIAGNOSIS — I10 Essential (primary) hypertension: Secondary | ICD-10-CM | POA: Diagnosis not present

## 2013-04-01 DIAGNOSIS — E1169 Type 2 diabetes mellitus with other specified complication: Secondary | ICD-10-CM | POA: Diagnosis not present

## 2013-04-01 DIAGNOSIS — Z23 Encounter for immunization: Secondary | ICD-10-CM | POA: Diagnosis not present

## 2013-04-01 DIAGNOSIS — R972 Elevated prostate specific antigen [PSA]: Secondary | ICD-10-CM | POA: Diagnosis not present

## 2013-04-01 DIAGNOSIS — J189 Pneumonia, unspecified organism: Secondary | ICD-10-CM | POA: Diagnosis not present

## 2013-04-29 DIAGNOSIS — I10 Essential (primary) hypertension: Secondary | ICD-10-CM | POA: Diagnosis not present

## 2013-04-29 DIAGNOSIS — E119 Type 2 diabetes mellitus without complications: Secondary | ICD-10-CM | POA: Diagnosis not present

## 2013-04-29 DIAGNOSIS — M1990 Unspecified osteoarthritis, unspecified site: Secondary | ICD-10-CM | POA: Diagnosis not present

## 2013-05-19 DIAGNOSIS — D0430 Carcinoma in situ of skin of unspecified part of face: Secondary | ICD-10-CM | POA: Diagnosis not present

## 2013-05-19 DIAGNOSIS — L821 Other seborrheic keratosis: Secondary | ICD-10-CM | POA: Diagnosis not present

## 2013-05-19 DIAGNOSIS — D0439 Carcinoma in situ of skin of other parts of face: Secondary | ICD-10-CM | POA: Diagnosis not present

## 2013-05-19 DIAGNOSIS — L570 Actinic keratosis: Secondary | ICD-10-CM | POA: Diagnosis not present

## 2013-05-19 DIAGNOSIS — Z85828 Personal history of other malignant neoplasm of skin: Secondary | ICD-10-CM | POA: Diagnosis not present

## 2013-05-19 DIAGNOSIS — D485 Neoplasm of uncertain behavior of skin: Secondary | ICD-10-CM | POA: Diagnosis not present

## 2013-05-19 DIAGNOSIS — L820 Inflamed seborrheic keratosis: Secondary | ICD-10-CM | POA: Diagnosis not present

## 2013-06-11 DIAGNOSIS — H5315 Visual distortions of shape and size: Secondary | ICD-10-CM | POA: Diagnosis not present

## 2013-06-11 DIAGNOSIS — H538 Other visual disturbances: Secondary | ICD-10-CM | POA: Diagnosis not present

## 2013-06-11 DIAGNOSIS — H53129 Transient visual loss, unspecified eye: Secondary | ICD-10-CM | POA: Diagnosis not present

## 2013-06-11 DIAGNOSIS — H5310 Unspecified subjective visual disturbances: Secondary | ICD-10-CM | POA: Diagnosis not present

## 2013-06-11 DIAGNOSIS — H2510 Age-related nuclear cataract, unspecified eye: Secondary | ICD-10-CM | POA: Diagnosis not present

## 2013-06-11 DIAGNOSIS — H5034 Intermittent alternating exotropia: Secondary | ICD-10-CM | POA: Diagnosis not present

## 2013-06-11 DIAGNOSIS — H5330 Unspecified disorder of binocular vision: Secondary | ICD-10-CM | POA: Diagnosis not present

## 2013-07-21 DIAGNOSIS — K5900 Constipation, unspecified: Secondary | ICD-10-CM | POA: Diagnosis not present

## 2013-07-21 DIAGNOSIS — E1169 Type 2 diabetes mellitus with other specified complication: Secondary | ICD-10-CM | POA: Diagnosis not present

## 2013-07-21 DIAGNOSIS — E7800 Pure hypercholesterolemia, unspecified: Secondary | ICD-10-CM | POA: Diagnosis not present

## 2013-07-21 DIAGNOSIS — J309 Allergic rhinitis, unspecified: Secondary | ICD-10-CM | POA: Diagnosis not present

## 2013-07-21 DIAGNOSIS — Z6830 Body mass index (BMI) 30.0-30.9, adult: Secondary | ICD-10-CM | POA: Diagnosis not present

## 2013-07-21 DIAGNOSIS — I10 Essential (primary) hypertension: Secondary | ICD-10-CM | POA: Diagnosis not present

## 2013-11-17 DIAGNOSIS — L821 Other seborrheic keratosis: Secondary | ICD-10-CM | POA: Diagnosis not present

## 2013-11-17 DIAGNOSIS — L570 Actinic keratosis: Secondary | ICD-10-CM | POA: Diagnosis not present

## 2013-11-17 DIAGNOSIS — D485 Neoplasm of uncertain behavior of skin: Secondary | ICD-10-CM | POA: Diagnosis not present

## 2013-11-17 DIAGNOSIS — Z85828 Personal history of other malignant neoplasm of skin: Secondary | ICD-10-CM | POA: Diagnosis not present

## 2013-11-25 DIAGNOSIS — E1169 Type 2 diabetes mellitus with other specified complication: Secondary | ICD-10-CM | POA: Diagnosis not present

## 2013-11-25 DIAGNOSIS — E7800 Pure hypercholesterolemia, unspecified: Secondary | ICD-10-CM | POA: Diagnosis not present

## 2013-11-25 DIAGNOSIS — Z125 Encounter for screening for malignant neoplasm of prostate: Secondary | ICD-10-CM | POA: Diagnosis not present

## 2013-12-01 DIAGNOSIS — K5900 Constipation, unspecified: Secondary | ICD-10-CM | POA: Diagnosis not present

## 2013-12-01 DIAGNOSIS — M1990 Unspecified osteoarthritis, unspecified site: Secondary | ICD-10-CM | POA: Diagnosis not present

## 2013-12-01 DIAGNOSIS — I10 Essential (primary) hypertension: Secondary | ICD-10-CM | POA: Diagnosis not present

## 2013-12-01 DIAGNOSIS — E7800 Pure hypercholesterolemia, unspecified: Secondary | ICD-10-CM | POA: Diagnosis not present

## 2013-12-01 DIAGNOSIS — E119 Type 2 diabetes mellitus without complications: Secondary | ICD-10-CM | POA: Diagnosis not present

## 2013-12-01 DIAGNOSIS — Z1331 Encounter for screening for depression: Secondary | ICD-10-CM | POA: Diagnosis not present

## 2013-12-01 DIAGNOSIS — Z23 Encounter for immunization: Secondary | ICD-10-CM | POA: Diagnosis not present

## 2013-12-01 DIAGNOSIS — M25559 Pain in unspecified hip: Secondary | ICD-10-CM | POA: Diagnosis not present

## 2013-12-01 DIAGNOSIS — Z0000 Encounter for general adult medical examination without abnormal findings: Secondary | ICD-10-CM | POA: Diagnosis not present

## 2013-12-01 DIAGNOSIS — N400 Enlarged prostate without lower urinary tract symptoms: Secondary | ICD-10-CM | POA: Diagnosis not present

## 2013-12-03 DIAGNOSIS — Z1212 Encounter for screening for malignant neoplasm of rectum: Secondary | ICD-10-CM | POA: Diagnosis not present

## 2013-12-03 DIAGNOSIS — M1610 Unilateral primary osteoarthritis, unspecified hip: Secondary | ICD-10-CM | POA: Diagnosis not present

## 2013-12-12 DIAGNOSIS — M1610 Unilateral primary osteoarthritis, unspecified hip: Secondary | ICD-10-CM | POA: Diagnosis not present

## 2014-01-05 DIAGNOSIS — M1610 Unilateral primary osteoarthritis, unspecified hip: Secondary | ICD-10-CM | POA: Diagnosis not present

## 2014-02-03 DIAGNOSIS — Z23 Encounter for immunization: Secondary | ICD-10-CM | POA: Diagnosis not present

## 2014-02-05 DIAGNOSIS — Z6830 Body mass index (BMI) 30.0-30.9, adult: Secondary | ICD-10-CM | POA: Diagnosis not present

## 2014-02-05 DIAGNOSIS — K5900 Constipation, unspecified: Secondary | ICD-10-CM | POA: Diagnosis not present

## 2014-02-05 DIAGNOSIS — E119 Type 2 diabetes mellitus without complications: Secondary | ICD-10-CM | POA: Diagnosis not present

## 2014-02-05 DIAGNOSIS — R1032 Left lower quadrant pain: Secondary | ICD-10-CM | POA: Diagnosis not present

## 2014-02-05 DIAGNOSIS — I10 Essential (primary) hypertension: Secondary | ICD-10-CM | POA: Diagnosis not present

## 2014-03-30 DIAGNOSIS — R05 Cough: Secondary | ICD-10-CM | POA: Diagnosis not present

## 2014-03-30 DIAGNOSIS — J069 Acute upper respiratory infection, unspecified: Secondary | ICD-10-CM | POA: Diagnosis not present

## 2014-03-30 DIAGNOSIS — Z6830 Body mass index (BMI) 30.0-30.9, adult: Secondary | ICD-10-CM | POA: Diagnosis not present

## 2014-04-02 DIAGNOSIS — R05 Cough: Secondary | ICD-10-CM | POA: Diagnosis not present

## 2014-04-02 DIAGNOSIS — J309 Allergic rhinitis, unspecified: Secondary | ICD-10-CM | POA: Diagnosis not present

## 2014-04-02 DIAGNOSIS — Z6830 Body mass index (BMI) 30.0-30.9, adult: Secondary | ICD-10-CM | POA: Diagnosis not present

## 2014-04-02 DIAGNOSIS — Z1389 Encounter for screening for other disorder: Secondary | ICD-10-CM | POA: Diagnosis not present

## 2014-04-02 DIAGNOSIS — E119 Type 2 diabetes mellitus without complications: Secondary | ICD-10-CM | POA: Diagnosis not present

## 2014-04-02 DIAGNOSIS — I10 Essential (primary) hypertension: Secondary | ICD-10-CM | POA: Diagnosis not present

## 2014-05-20 DIAGNOSIS — L820 Inflamed seborrheic keratosis: Secondary | ICD-10-CM | POA: Diagnosis not present

## 2014-05-20 DIAGNOSIS — L821 Other seborrheic keratosis: Secondary | ICD-10-CM | POA: Diagnosis not present

## 2014-05-20 DIAGNOSIS — L570 Actinic keratosis: Secondary | ICD-10-CM | POA: Diagnosis not present

## 2014-05-20 DIAGNOSIS — Z85828 Personal history of other malignant neoplasm of skin: Secondary | ICD-10-CM | POA: Diagnosis not present

## 2014-07-15 DIAGNOSIS — H2513 Age-related nuclear cataract, bilateral: Secondary | ICD-10-CM | POA: Diagnosis not present

## 2014-07-15 DIAGNOSIS — H5010 Unspecified exotropia: Secondary | ICD-10-CM | POA: Diagnosis not present

## 2014-07-15 DIAGNOSIS — E119 Type 2 diabetes mellitus without complications: Secondary | ICD-10-CM | POA: Diagnosis not present

## 2014-07-15 DIAGNOSIS — H02831 Dermatochalasis of right upper eyelid: Secondary | ICD-10-CM | POA: Diagnosis not present

## 2014-07-15 DIAGNOSIS — H02834 Dermatochalasis of left upper eyelid: Secondary | ICD-10-CM | POA: Diagnosis not present

## 2014-08-03 DIAGNOSIS — I10 Essential (primary) hypertension: Secondary | ICD-10-CM | POA: Diagnosis not present

## 2014-08-03 DIAGNOSIS — M479 Spondylosis, unspecified: Secondary | ICD-10-CM | POA: Diagnosis not present

## 2014-08-03 DIAGNOSIS — E780 Pure hypercholesterolemia: Secondary | ICD-10-CM | POA: Diagnosis not present

## 2014-08-03 DIAGNOSIS — J309 Allergic rhinitis, unspecified: Secondary | ICD-10-CM | POA: Diagnosis not present

## 2014-08-03 DIAGNOSIS — E119 Type 2 diabetes mellitus without complications: Secondary | ICD-10-CM | POA: Diagnosis not present

## 2014-08-03 DIAGNOSIS — Z1389 Encounter for screening for other disorder: Secondary | ICD-10-CM | POA: Diagnosis not present

## 2014-08-03 DIAGNOSIS — Z6830 Body mass index (BMI) 30.0-30.9, adult: Secondary | ICD-10-CM | POA: Diagnosis not present

## 2014-08-06 DIAGNOSIS — H4422 Degenerative myopia, left eye: Secondary | ICD-10-CM | POA: Diagnosis not present

## 2014-08-06 DIAGNOSIS — H5034 Intermittent alternating exotropia: Secondary | ICD-10-CM | POA: Diagnosis not present

## 2014-09-07 DIAGNOSIS — H4422 Degenerative myopia, left eye: Secondary | ICD-10-CM | POA: Diagnosis not present

## 2014-09-07 DIAGNOSIS — H5034 Intermittent alternating exotropia: Secondary | ICD-10-CM | POA: Diagnosis not present

## 2014-09-09 ENCOUNTER — Encounter (HOSPITAL_BASED_OUTPATIENT_CLINIC_OR_DEPARTMENT_OTHER)
Admission: RE | Admit: 2014-09-09 | Discharge: 2014-09-09 | Disposition: A | Discharge status: Home / Self Care | Payer: Medicare Other | Source: Ambulatory Visit | Attending: Ophthalmology | Admitting: Ophthalmology

## 2014-09-09 ENCOUNTER — Encounter (HOSPITAL_BASED_OUTPATIENT_CLINIC_OR_DEPARTMENT_OTHER): Payer: Self-pay | Admitting: *Deleted

## 2014-09-09 ENCOUNTER — Other Ambulatory Visit: Payer: Self-pay

## 2014-09-09 DIAGNOSIS — E119 Type 2 diabetes mellitus without complications: Secondary | ICD-10-CM | POA: Diagnosis not present

## 2014-09-09 DIAGNOSIS — Z79899 Other long term (current) drug therapy: Secondary | ICD-10-CM | POA: Diagnosis not present

## 2014-09-09 DIAGNOSIS — R001 Bradycardia, unspecified: Secondary | ICD-10-CM | POA: Diagnosis not present

## 2014-09-09 DIAGNOSIS — M179 Osteoarthritis of knee, unspecified: Secondary | ICD-10-CM | POA: Diagnosis not present

## 2014-09-09 DIAGNOSIS — H5010 Unspecified exotropia: Secondary | ICD-10-CM | POA: Diagnosis not present

## 2014-09-09 DIAGNOSIS — Z96659 Presence of unspecified artificial knee joint: Secondary | ICD-10-CM | POA: Diagnosis not present

## 2014-09-09 DIAGNOSIS — Z87891 Personal history of nicotine dependence: Secondary | ICD-10-CM | POA: Diagnosis not present

## 2014-09-09 DIAGNOSIS — I10 Essential (primary) hypertension: Secondary | ICD-10-CM | POA: Diagnosis not present

## 2014-09-10 ENCOUNTER — Ambulatory Visit: Payer: Self-pay | Admitting: Ophthalmology

## 2014-09-10 NOTE — H&P (Signed)
  Date of examinatin:  09-07-14  Indication for surgery: to straighten the eyes and allow some binocularity  Pertinent past medical history:  Past Medical History  Diagnosis Date  . Diabetes mellitus   . Hypercholesteremia   . S/P total knee arthroplasty 05/13/2007  . Left knee DJD   . Complication of anesthesia     confusion  . Anxiety   . H/O hiatal hernia   . Hypertension     dr Dagmar Hait     pcp  . Exotropia of left eye     Pertinent ocular history:  Hx strabismus surgery for XT "left eye" 1979, records unavailable.  Was straight postop but now drifting out x 5-6 years  Pertinent family history:  Family History  Problem Relation Age of Onset  . Heart attack Father   . Stroke Maternal Grandfather   . Cancer    . Stroke Mother     General:  Healthy appearing patient in no distress.    Eyes:    Acuity cc OD 20/25  OS 20/20  External: Within normal limits     Anterior segment: Within normal limits x healed conj scars OD (not OS) and mod NS OU  Motility:   XT=35, LHT=6.  Small V.  XT'=40.  2+ LIO OA   Fundus: deferred  Refraction:  Manifest approx plano OU  Heart: Regular rate and rhythm without murmur     Lungs: Clear to auscultation     Abdomen: Soft, nontender, normal bowel sounds     Impression:Exotropia, recurrent, hx strabismus surgery OS but I see conj scars only OD   Plan: LLR recess and LMR resect (assuming no previous surgery OS).  If need to reop muscles, consider adjustable and consider surgery on OD  Branda Chaudhary O

## 2014-09-11 ENCOUNTER — Ambulatory Visit (HOSPITAL_BASED_OUTPATIENT_CLINIC_OR_DEPARTMENT_OTHER): Payer: Medicare Other | Admitting: Certified Registered"

## 2014-09-11 ENCOUNTER — Encounter (HOSPITAL_BASED_OUTPATIENT_CLINIC_OR_DEPARTMENT_OTHER): Payer: Self-pay | Admitting: Certified Registered"

## 2014-09-11 ENCOUNTER — Encounter (HOSPITAL_BASED_OUTPATIENT_CLINIC_OR_DEPARTMENT_OTHER)
Admission: RE | Disposition: A | Discharge status: Home / Self Care | Payer: Self-pay | Source: Ambulatory Visit | Attending: Ophthalmology

## 2014-09-11 ENCOUNTER — Ambulatory Visit (HOSPITAL_BASED_OUTPATIENT_CLINIC_OR_DEPARTMENT_OTHER)
Admission: RE | Admit: 2014-09-11 | Discharge: 2014-09-11 | Disposition: A | Discharge status: Home / Self Care | Payer: Medicare Other | Source: Ambulatory Visit | Attending: Ophthalmology | Admitting: Ophthalmology

## 2014-09-11 DIAGNOSIS — E119 Type 2 diabetes mellitus without complications: Secondary | ICD-10-CM | POA: Insufficient documentation

## 2014-09-11 DIAGNOSIS — H5034 Intermittent alternating exotropia: Secondary | ICD-10-CM | POA: Diagnosis not present

## 2014-09-11 DIAGNOSIS — R001 Bradycardia, unspecified: Secondary | ICD-10-CM | POA: Insufficient documentation

## 2014-09-11 DIAGNOSIS — M179 Osteoarthritis of knee, unspecified: Secondary | ICD-10-CM | POA: Diagnosis not present

## 2014-09-11 DIAGNOSIS — Z79899 Other long term (current) drug therapy: Secondary | ICD-10-CM | POA: Insufficient documentation

## 2014-09-11 DIAGNOSIS — Z96659 Presence of unspecified artificial knee joint: Secondary | ICD-10-CM | POA: Insufficient documentation

## 2014-09-11 DIAGNOSIS — Z87891 Personal history of nicotine dependence: Secondary | ICD-10-CM | POA: Insufficient documentation

## 2014-09-11 DIAGNOSIS — H5010 Unspecified exotropia: Secondary | ICD-10-CM | POA: Diagnosis not present

## 2014-09-11 DIAGNOSIS — I10 Essential (primary) hypertension: Secondary | ICD-10-CM | POA: Insufficient documentation

## 2014-09-11 DIAGNOSIS — H4422 Degenerative myopia, left eye: Secondary | ICD-10-CM | POA: Diagnosis not present

## 2014-09-11 HISTORY — DX: Unspecified exotropia: H50.10

## 2014-09-11 HISTORY — PX: STRABISMUS SURGERY: SHX218

## 2014-09-11 LAB — POCT I-STAT, CHEM 8
BUN: 24 mg/dL — AB (ref 6–20)
CALCIUM ION: 1.21 mmol/L (ref 1.13–1.30)
CHLORIDE: 106 mmol/L (ref 101–111)
Creatinine, Ser: 1.1 mg/dL (ref 0.61–1.24)
GLUCOSE: 147 mg/dL — AB (ref 65–99)
HCT: 46 % (ref 39.0–52.0)
Hemoglobin: 15.6 g/dL (ref 13.0–17.0)
POTASSIUM: 5 mmol/L (ref 3.5–5.1)
Sodium: 141 mmol/L (ref 135–145)
TCO2: 22 mmol/L (ref 0–100)

## 2014-09-11 LAB — GLUCOSE, CAPILLARY
GLUCOSE-CAPILLARY: 160 mg/dL — AB (ref 65–99)
Glucose-Capillary: 152 mg/dL — ABNORMAL HIGH (ref 65–99)

## 2014-09-11 SURGERY — REPAIR STRABISMUS
Anesthesia: General | Site: Eye | Laterality: Left

## 2014-09-11 MED ORDER — LIDOCAINE HCL (CARDIAC) 20 MG/ML IV SOLN
INTRAVENOUS | Status: DC | PRN
  Administered 2014-09-11: 60 mg via INTRAVENOUS

## 2014-09-11 MED ORDER — MIDAZOLAM HCL 2 MG/2ML IJ SOLN
INTRAMUSCULAR | Status: AC
  Filled 2014-09-11: qty 2

## 2014-09-11 MED ORDER — SUCCINYLCHOLINE CHLORIDE 20 MG/ML IJ SOLN
INTRAMUSCULAR | Status: AC
  Filled 2014-09-11: qty 1

## 2014-09-11 MED ORDER — PROPOFOL 500 MG/50ML IV EMUL
INTRAVENOUS | Status: AC
  Filled 2014-09-11: qty 50

## 2014-09-11 MED ORDER — KETOROLAC TROMETHAMINE 30 MG/ML IJ SOLN
INTRAMUSCULAR | Status: DC | PRN
  Administered 2014-09-11: 30 mg via INTRAVENOUS

## 2014-09-11 MED ORDER — GLYCOPYRROLATE 0.2 MG/ML IJ SOLN
0.2000 mg | Freq: Once | INTRAMUSCULAR | Status: AC | PRN
  Administered 2014-09-11: 0.2 mg via INTRAVENOUS

## 2014-09-11 MED ORDER — EPHEDRINE SULFATE 50 MG/ML IJ SOLN
INTRAMUSCULAR | Status: DC | PRN
  Administered 2014-09-11: 10 mg via INTRAVENOUS

## 2014-09-11 MED ORDER — ONDANSETRON HCL 4 MG/2ML IJ SOLN
INTRAMUSCULAR | Status: DC | PRN
  Administered 2014-09-11: 4 mg via INTRAVENOUS

## 2014-09-11 MED ORDER — FENTANYL CITRATE (PF) 100 MCG/2ML IJ SOLN: 50.0000 ug | INTRAMUSCULAR | Status: DC | PRN

## 2014-09-11 MED ORDER — TOBRAMYCIN-DEXAMETHASONE 0.3-0.1 % OP OINT
TOPICAL_OINTMENT | OPHTHALMIC | Status: DC | PRN
  Administered 2014-09-11: 1 via OPHTHALMIC

## 2014-09-11 MED ORDER — BSS IO SOLN
INTRAOCULAR | Status: AC
  Filled 2014-09-11: qty 15

## 2014-09-11 MED ORDER — DEXAMETHASONE SODIUM PHOSPHATE 4 MG/ML IJ SOLN
INTRAMUSCULAR | Status: DC | PRN
  Administered 2014-09-11: 4 mg via INTRAVENOUS

## 2014-09-11 MED ORDER — LACTATED RINGERS IV SOLN
INTRAVENOUS | Status: DC
  Administered 2014-09-11 (×2): via INTRAVENOUS

## 2014-09-11 MED ORDER — FENTANYL CITRATE (PF) 100 MCG/2ML IJ SOLN
INTRAMUSCULAR | Status: AC
  Filled 2014-09-11: qty 4

## 2014-09-11 MED ORDER — BSS IO SOLN
INTRAOCULAR | Status: DC | PRN
  Administered 2014-09-11: 8 mL

## 2014-09-11 MED ORDER — FENTANYL CITRATE (PF) 100 MCG/2ML IJ SOLN
INTRAMUSCULAR | Status: DC | PRN
  Administered 2014-09-11: 100 ug via INTRAVENOUS

## 2014-09-11 MED ORDER — TOBRAMYCIN-DEXAMETHASONE 0.3-0.1 % OP OINT: 1.0000 "application " | TOPICAL_OINTMENT | Freq: Two times a day (BID) | OPHTHALMIC | Status: DC

## 2014-09-11 MED ORDER — OXYCODONE-ACETAMINOPHEN 7.5-325 MG PO TABS: 1.0000 | ORAL_TABLET | ORAL | Status: DC | PRN

## 2014-09-11 MED ORDER — MIDAZOLAM HCL 2 MG/2ML IJ SOLN: 1.0000 mg | INTRAMUSCULAR | Status: DC | PRN

## 2014-09-11 MED ORDER — PROPOFOL 10 MG/ML IV BOLUS
INTRAVENOUS | Status: DC | PRN
  Administered 2014-09-11: 200 mg via INTRAVENOUS

## 2014-09-11 SURGICAL SUPPLY — 32 items
APL SRG 3 HI ABS STRL LF PLS (MISCELLANEOUS) ×1
APPLICATOR COTTON TIP 6IN STRL (MISCELLANEOUS) ×12 IMPLANT
APPLICATOR DR MATTHEWS STRL (MISCELLANEOUS) ×3 IMPLANT
BANDAGE EYE OVAL (MISCELLANEOUS) IMPLANT
CAUTERY EYE LOW TEMP 1300F FIN (OPHTHALMIC RELATED) IMPLANT
CLOSURE WOUND 1/4X4 (GAUZE/BANDAGES/DRESSINGS)
COVER BACK TABLE 60X90IN (DRAPES) ×3 IMPLANT
COVER MAYO STAND STRL (DRAPES) ×3 IMPLANT
DRAPE SURG 17X23 STRL (DRAPES) ×6 IMPLANT
DRAPE U-SHAPE 76X120 STRL (DRAPES) ×2 IMPLANT
GLOVE BIO SURGEON STRL SZ 6.5 (GLOVE) ×2 IMPLANT
GLOVE BIO SURGEONS STRL SZ 6.5 (GLOVE) ×1
GLOVE BIOGEL M STRL SZ7.5 (GLOVE) ×6 IMPLANT
GOWN STRL REUS W/ TWL LRG LVL3 (GOWN DISPOSABLE) ×1 IMPLANT
GOWN STRL REUS W/TWL LRG LVL3 (GOWN DISPOSABLE) ×3
GOWN STRL REUS W/TWL XL LVL3 (GOWN DISPOSABLE) ×3 IMPLANT
NS IRRIG 1000ML POUR BTL (IV SOLUTION) ×3 IMPLANT
PACK BASIN DAY SURGERY FS (CUSTOM PROCEDURE TRAY) ×3 IMPLANT
SHEET MEDIUM DRAPE 40X70 STRL (DRAPES) IMPLANT
SLEEVE SCD COMPRESS KNEE MED (MISCELLANEOUS) ×3 IMPLANT
SPEAR EYE SURG WECK-CEL (MISCELLANEOUS) ×6 IMPLANT
STRIP CLOSURE SKIN 1/4X4 (GAUZE/BANDAGES/DRESSINGS) IMPLANT
SUT 6 0 SILK T G140 8DA (SUTURE) ×2 IMPLANT
SUT MERSILENE 6 0 S14 DA (SUTURE) IMPLANT
SUT PLAIN 6 0 TG1408 (SUTURE) ×3 IMPLANT
SUT SILK 4 0 C 3 735G (SUTURE) IMPLANT
SUT VICRYL 6 0 S 28 (SUTURE) ×2 IMPLANT
SUT VICRYL ABS 6-0 S29 18IN (SUTURE) ×3 IMPLANT
SYR TB 1ML LL NO SAFETY (SYRINGE) ×3 IMPLANT
SYRINGE 10CC LL (SYRINGE) ×3 IMPLANT
TOWEL OR 17X24 6PK STRL BLUE (TOWEL DISPOSABLE) ×3 IMPLANT
TRAY DSU PREP LF (CUSTOM PROCEDURE TRAY) ×3 IMPLANT

## 2014-09-11 NOTE — Interval H&P Note (Signed)
History and Physical Interval Note:  09/11/2014 7:18 AM  [name]  has presented today for surgery, with the diagnosis of exotropia  The various methods of treatment have been discussed with the patient and family. After consideration of risks, benefits and other options for treatment, the patient has consented to  Procedure(s): REPAIR STRABISMUS LEFT EYE (Left) as a surgical intervention .  The patient's history has been reviewed, patient examined, no change in status, stable for surgery.  I have reviewed the patient's chart and labs.  Questions were answered to the patient's satisfaction.     Derry Skill

## 2014-09-11 NOTE — Discharge Instructions (Signed)
°  Post Anesthesia Home Care Instructions  Activity: Get plenty of rest for the remainder of the day. A responsible adult should stay with you for 24 hours following the procedure.  For the next 24 hours, DO NOT: -Drive a car -Paediatric nurse -Drink alcoholic beverages -Take any medication unless instructed by your physician -Make any legal decisions or sign important papers.  Meals: Start with liquid foods such as gelatin or soup. Progress to regular foods as tolerated. Avoid greasy, spicy, heavy foods. If nausea and/or vomiting occur, drink only clear liquids until the nausea and/or vomiting subsides. Call your physician if vomiting continues.  Special Instructions/Symptoms: Your throat may feel dry or sore from the anesthesia or the breathing tube placed in your throat during surgery. If this causes discomfort, gargle with warm salt water. The discomfort should disappear within 24 hours.  If you had a scopolamine patch placed behind your ear for the management of post- operative nausea and/or vomiting:  1. The medication in the patch is effective for 72 hours, after which it should be removed.  Wrap patch in a tissue and discard in the trash. Wash hands thoroughly with soap and water. 2. You may remove the patch earlier than 72 hours if you experience unpleasant side effects which may include dry mouth, dizziness or visual disturbances. 3. Avoid touching the patch. Wash your hands with soap and water after contact with the patch.     Dr. Janee Morn Post-op Instructions:  Diet: Clear liquids, advance to soft foods then regular diet as tolerated.  Pain control:   1)  Ibuprofen 600 mg by mouth every 6-8 hours as needed for pain  2)  Percocet 7.5/325 one or two by mouth as needed every 4-6 hours as needed  for pain that is not resolved by ibuprofen  Eye medications:  Tobradex eye ointment 1/2 inch in left eye every morning and evening for 1 week  Activity: No swimming for 1 week.  It  is OK to let water run over the face and eyes while showering or taking a bath, even during the first week.  No other restriction on exercise or activity.  Call Dr. Janee Morn office [phone] with any problems or concerns.

## 2014-09-11 NOTE — Anesthesia Procedure Notes (Signed)
Procedure Name: LMA Insertion Date/Time: 09/11/2014 8:10 AM Performed by: Baxter Flattery Pre-anesthesia Checklist: Patient identified, Emergency Drugs available, Suction available and Patient being monitored Patient Re-evaluated:Patient Re-evaluated prior to inductionOxygen Delivery Method: Circle System Utilized Preoxygenation: Pre-oxygenation with 100% oxygen Intubation Type: IV induction Ventilation: Mask ventilation without difficulty LMA: LMA flexible inserted LMA Size: 5.0 Number of attempts: 1 Airway Equipment and Method: Bite block Placement Confirmation: positive ETCO2 and breath sounds checked- equal and bilateral Tube secured with: Tape Dental Injury: Teeth and Oropharynx as per pre-operative assessment

## 2014-09-11 NOTE — Transfer of Care (Signed)
Immediate Anesthesia Transfer of Care Note  Patient: [name]  Procedure(s) Performed: Procedure(s): REPAIR STRABISMUS LEFT EYE (Left)  Patient Location: PACU  Anesthesia Type:General  Level of Consciousness: awake, alert  and patient cooperative  Airway & Oxygen Therapy: Patient Spontanous Breathing and Patient connected to face mask oxygen  Post-op Assessment: Report given to RN, Post -op Vital signs reviewed and stable and Patient moving all extremities  Post vital signs: Reviewed and stable  Last Vitals:  Filed Vitals:   09/11/14 0701  BP: 134/51  Pulse: 50  Temp: 36.4 C  Resp: 18    Complications: No apparent anesthesia complications

## 2014-09-11 NOTE — Op Note (Signed)
09/11/2014  9:17 AM  PATIENT:  [name]  81 y.o. male  PRE-OPERATIVE DIAGNOSIS:  Exotropia, recurrent  POST-OPERATIVE DIAGNOSIS:  Exotropia, recurrent  PROCEDURE:  1. Lateral rectus muscle recession 7.0 mm left eye   2.  Medial rectus muscle resection 7.0 mm  SURGEON:  Lorne Skeens.Annamaria Boots, M.D.   ANESTHESIA:   general  COMPLICATIONS:None  DESCRIPTION OF PROCEDURE: The patient was taken to the operating room where He was identified by me. General anesthesia was induced without difficulty after placement of appropriate monitors. The patient was prepped and draped in standard sterile fashion. .A lid speculum was placed in the left eye. The conjunctiva was inspected for scarring or other evidence of previous surgery, and none was found  Through an inferotemporal fornix incision through conjunctiva and Tenon's fascia, the left lateral rectus muscle was engaged on a series of muscle hooks and cleared of its fascial attachments. The tendon was secured with a double-armed 6-0 Vicryl suture with a double locking bite at each border of the muscle, 1 mm from the insertion. The muscle was disinserted, and was reattached to sclera at a measured distance of 7.0 millimeters posterior to the original insertion, using direct scleral passes in crossed swords fashion.  The suture ends were tied securely after the position of the muscle had been checked and found to be accurate. Conjunctiva was closed with 2 6-0 plain gut sutures.  Through an inferonasal fornix incision through conjunctiva and Tenon's fascia, the left medial rectus muscle was engaged on a series of muscle hooks. It was carefully cleared of its surrounding fascial attachments to at least 15 mm posterior to the insertion. The muscle was spread between 2 self-retaining hooks. A 2 mm bite was taken of the center of the muscle belly at a measured at a measured distance of 7.0 mm posterior to the insertion, and a knot was tied securely at this location.  The needle at each end of the double-armed suture was passed from the center of the muscle belly to the periphery, parallel to and 7.0 mm posterior to the insertion. A locking bite was placed at each border of the muscle. A resection clamp was placed on the muscle just anterior to the sutures. The muscle was disinserted. Each pole suture was passed posteriorly to anteriorly through the corresponding end of the muscle stump, then anteriorly to posteriorly through the center of the stump, then posteriorly to anteriorly through the center of the muscle belly, just posterior to the previously placed knot. The muscle was drawn up to level of the rigid original insertion, and all slack was removed. The clamp was removed. The suture ends were tied securely. The portion of the muscle anterior to the sutures was carefully excised. Conjunctiva was closed with a single 6-0 plain gut suture.  TobraDex ointment was placed in the left eye. The patient was awakened without difficulty and taken to the recovery room in stable condition, having suffered no intraoperative or immediate postoperative complications.  Lorne Skeens. Kamara Allan M.D.    PATIENT DISPOSITION:  PACU - hemodynamically stable.

## 2014-09-11 NOTE — Anesthesia Preprocedure Evaluation (Signed)
Anesthesia Evaluation  Patient identified by MRN, date of birth, ID band Patient awake    Reviewed: Allergy & Precautions, NPO status , Patient's Chart, lab work & pertinent test results  Airway Mallampati: I  TM Distance: >3 FB Neck ROM: Full    Dental  (+) Teeth Intact, Dental Advisory Given   Pulmonary former smoker,  breath sounds clear to auscultation        Cardiovascular hypertension, Pt. on medications Rhythm:Regular Rate:Normal     Neuro/Psych    GI/Hepatic   Endo/Other  diabetes, Well Controlled, Type 2, Oral Hypoglycemic Agents  Renal/GU      Musculoskeletal   Abdominal   Peds  Hematology   Anesthesia Other Findings   Reproductive/Obstetrics                             Anesthesia Physical Anesthesia Plan  ASA: III  Anesthesia Plan: General   Post-op Pain Management:    Induction: Intravenous  Airway Management Planned: LMA  Additional Equipment:   Intra-op Plan:   Post-operative Plan: Extubation in OR  Informed Consent: I have reviewed the patients History and Physical, chart, labs and discussed the procedure including the risks, benefits and alternatives for the proposed anesthesia with the patient or authorized representative who has indicated his/her understanding and acceptance.   Dental advisory given  Plan Discussed with: CRNA, Anesthesiologist and Surgeon  Anesthesia Plan Comments:         Anesthesia Quick Evaluation

## 2014-09-11 NOTE — H&P (View-Only) (Signed)
  Date of examinatin:  09-07-14  Indication for surgery: to straighten the eyes and allow some binocularity  Pertinent past medical history:  Past Medical History  Diagnosis Date  . Diabetes mellitus   . Hypercholesteremia   . S/P total knee arthroplasty 05/13/2007  . Left knee DJD   . Complication of anesthesia     confusion  . Anxiety   . H/O hiatal hernia   . Hypertension     dr Dagmar Hait     pcp  . Exotropia of left eye     Pertinent ocular history:  Hx strabismus surgery for XT "left eye" 1979, records unavailable.  Was straight postop but now drifting out x 5-6 years  Pertinent family history:  Family History  Problem Relation Age of Onset  . Heart attack Father   . Stroke Maternal Grandfather   . Cancer    . Stroke Mother     General:  Healthy appearing patient in no distress.    Eyes:    Acuity cc OD 20/25  OS 20/20  External: Within normal limits     Anterior segment: Within normal limits x healed conj scars OD (not OS) and mod NS OU  Motility:   XT=35, LHT=6.  Small V.  XT'=40.  2+ LIO OA   Fundus: deferred  Refraction:  Manifest approx plano OU  Heart: Regular rate and rhythm without murmur     Lungs: Clear to auscultation     Abdomen: Soft, nontender, normal bowel sounds     Impression:Exotropia, recurrent, hx strabismus surgery OS but I see conj scars only OD   Plan: LLR recess and LMR resect (assuming no previous surgery OS).  If need to reop muscles, consider adjustable and consider surgery on OD  Rice Walsh O

## 2014-09-11 NOTE — Anesthesia Postprocedure Evaluation (Signed)
  Anesthesia Post-op Note  Patient: [name]  Procedure(s) Performed: Procedure(s): REPAIR STRABISMUS LEFT EYE (Left)  Patient Location: PACU  Anesthesia Type: General   Level of Consciousness: awake, alert  and oriented  Airway and Oxygen Therapy: Patient Spontanous Breathing  Post-op Pain: mild  Post-op Assessment: Post-op Vital signs reviewed  Post-op Vital Signs: Reviewed  Last Vitals:  Filed Vitals:   09/11/14 1031  BP: 135/66  Pulse: 62  Temp: 36.6 C  Resp: 14    Complications: No apparent anesthesia complications

## 2014-09-14 ENCOUNTER — Encounter (HOSPITAL_BASED_OUTPATIENT_CLINIC_OR_DEPARTMENT_OTHER): Payer: Self-pay | Admitting: Ophthalmology

## 2014-12-01 DIAGNOSIS — L821 Other seborrheic keratosis: Secondary | ICD-10-CM | POA: Diagnosis not present

## 2014-12-01 DIAGNOSIS — L820 Inflamed seborrheic keratosis: Secondary | ICD-10-CM | POA: Diagnosis not present

## 2014-12-01 DIAGNOSIS — Z85828 Personal history of other malignant neoplasm of skin: Secondary | ICD-10-CM | POA: Diagnosis not present

## 2014-12-01 DIAGNOSIS — L570 Actinic keratosis: Secondary | ICD-10-CM | POA: Diagnosis not present

## 2014-12-01 DIAGNOSIS — D1801 Hemangioma of skin and subcutaneous tissue: Secondary | ICD-10-CM | POA: Diagnosis not present

## 2014-12-14 DIAGNOSIS — E780 Pure hypercholesterolemia: Secondary | ICD-10-CM | POA: Diagnosis not present

## 2014-12-14 DIAGNOSIS — Z125 Encounter for screening for malignant neoplasm of prostate: Secondary | ICD-10-CM | POA: Diagnosis not present

## 2014-12-14 DIAGNOSIS — E119 Type 2 diabetes mellitus without complications: Secondary | ICD-10-CM | POA: Diagnosis not present

## 2014-12-14 DIAGNOSIS — I10 Essential (primary) hypertension: Secondary | ICD-10-CM | POA: Diagnosis not present

## 2014-12-21 DIAGNOSIS — Z1212 Encounter for screening for malignant neoplasm of rectum: Secondary | ICD-10-CM | POA: Diagnosis not present

## 2014-12-21 DIAGNOSIS — I10 Essential (primary) hypertension: Secondary | ICD-10-CM | POA: Diagnosis not present

## 2014-12-21 DIAGNOSIS — M1990 Unspecified osteoarthritis, unspecified site: Secondary | ICD-10-CM | POA: Diagnosis not present

## 2014-12-21 DIAGNOSIS — K5900 Constipation, unspecified: Secondary | ICD-10-CM | POA: Diagnosis not present

## 2014-12-21 DIAGNOSIS — J309 Allergic rhinitis, unspecified: Secondary | ICD-10-CM | POA: Diagnosis not present

## 2014-12-21 DIAGNOSIS — Z0000 Encounter for general adult medical examination without abnormal findings: Secondary | ICD-10-CM | POA: Diagnosis not present

## 2014-12-21 DIAGNOSIS — N400 Enlarged prostate without lower urinary tract symptoms: Secondary | ICD-10-CM | POA: Diagnosis not present

## 2014-12-21 DIAGNOSIS — Z23 Encounter for immunization: Secondary | ICD-10-CM | POA: Diagnosis not present

## 2014-12-21 DIAGNOSIS — E119 Type 2 diabetes mellitus without complications: Secondary | ICD-10-CM | POA: Diagnosis not present

## 2014-12-21 DIAGNOSIS — Z6830 Body mass index (BMI) 30.0-30.9, adult: Secondary | ICD-10-CM | POA: Diagnosis not present

## 2014-12-21 DIAGNOSIS — R972 Elevated prostate specific antigen [PSA]: Secondary | ICD-10-CM | POA: Diagnosis not present

## 2014-12-21 DIAGNOSIS — H5010 Unspecified exotropia: Secondary | ICD-10-CM | POA: Diagnosis not present

## 2014-12-21 DIAGNOSIS — E780 Pure hypercholesterolemia: Secondary | ICD-10-CM | POA: Diagnosis not present

## 2014-12-29 DIAGNOSIS — H5034 Intermittent alternating exotropia: Secondary | ICD-10-CM | POA: Diagnosis not present

## 2014-12-29 DIAGNOSIS — H4422 Degenerative myopia, left eye: Secondary | ICD-10-CM | POA: Diagnosis not present

## 2015-02-05 DIAGNOSIS — Z6829 Body mass index (BMI) 29.0-29.9, adult: Secondary | ICD-10-CM | POA: Diagnosis not present

## 2015-02-05 DIAGNOSIS — R05 Cough: Secondary | ICD-10-CM | POA: Diagnosis not present

## 2015-02-05 DIAGNOSIS — J069 Acute upper respiratory infection, unspecified: Secondary | ICD-10-CM | POA: Diagnosis not present

## 2015-04-28 DIAGNOSIS — Z6830 Body mass index (BMI) 30.0-30.9, adult: Secondary | ICD-10-CM | POA: Diagnosis not present

## 2015-04-28 DIAGNOSIS — R809 Proteinuria, unspecified: Secondary | ICD-10-CM | POA: Diagnosis not present

## 2015-04-28 DIAGNOSIS — I10 Essential (primary) hypertension: Secondary | ICD-10-CM | POA: Diagnosis not present

## 2015-04-28 DIAGNOSIS — E119 Type 2 diabetes mellitus without complications: Secondary | ICD-10-CM | POA: Diagnosis not present

## 2015-04-28 DIAGNOSIS — N400 Enlarged prostate without lower urinary tract symptoms: Secondary | ICD-10-CM | POA: Diagnosis not present

## 2015-06-02 DIAGNOSIS — H52223 Regular astigmatism, bilateral: Secondary | ICD-10-CM | POA: Diagnosis not present

## 2015-06-02 DIAGNOSIS — H5005 Alternating esotropia: Secondary | ICD-10-CM | POA: Diagnosis not present

## 2015-06-08 DIAGNOSIS — D1801 Hemangioma of skin and subcutaneous tissue: Secondary | ICD-10-CM | POA: Diagnosis not present

## 2015-06-08 DIAGNOSIS — L570 Actinic keratosis: Secondary | ICD-10-CM | POA: Diagnosis not present

## 2015-06-08 DIAGNOSIS — L821 Other seborrheic keratosis: Secondary | ICD-10-CM | POA: Diagnosis not present

## 2015-06-08 DIAGNOSIS — Z85828 Personal history of other malignant neoplasm of skin: Secondary | ICD-10-CM | POA: Diagnosis not present

## 2015-08-11 DIAGNOSIS — N400 Enlarged prostate without lower urinary tract symptoms: Secondary | ICD-10-CM | POA: Diagnosis not present

## 2015-08-11 DIAGNOSIS — I10 Essential (primary) hypertension: Secondary | ICD-10-CM | POA: Diagnosis not present

## 2015-08-11 DIAGNOSIS — E119 Type 2 diabetes mellitus without complications: Secondary | ICD-10-CM | POA: Diagnosis not present

## 2015-08-11 DIAGNOSIS — K5900 Constipation, unspecified: Secondary | ICD-10-CM | POA: Diagnosis not present

## 2015-08-11 DIAGNOSIS — Z6827 Body mass index (BMI) 27.0-27.9, adult: Secondary | ICD-10-CM | POA: Diagnosis not present

## 2015-09-06 DIAGNOSIS — E119 Type 2 diabetes mellitus without complications: Secondary | ICD-10-CM | POA: Diagnosis not present

## 2015-09-06 DIAGNOSIS — H8110 Benign paroxysmal vertigo, unspecified ear: Secondary | ICD-10-CM | POA: Diagnosis not present

## 2015-09-06 DIAGNOSIS — Z6827 Body mass index (BMI) 27.0-27.9, adult: Secondary | ICD-10-CM | POA: Diagnosis not present

## 2015-09-06 DIAGNOSIS — I10 Essential (primary) hypertension: Secondary | ICD-10-CM | POA: Diagnosis not present

## 2015-09-06 DIAGNOSIS — K5900 Constipation, unspecified: Secondary | ICD-10-CM | POA: Diagnosis not present

## 2015-10-06 DIAGNOSIS — H25813 Combined forms of age-related cataract, bilateral: Secondary | ICD-10-CM | POA: Diagnosis not present

## 2015-10-06 DIAGNOSIS — H5000 Unspecified esotropia: Secondary | ICD-10-CM | POA: Diagnosis not present

## 2015-10-06 DIAGNOSIS — E119 Type 2 diabetes mellitus without complications: Secondary | ICD-10-CM | POA: Diagnosis not present

## 2015-11-16 DIAGNOSIS — E119 Type 2 diabetes mellitus without complications: Secondary | ICD-10-CM | POA: Diagnosis not present

## 2015-11-16 DIAGNOSIS — K5900 Constipation, unspecified: Secondary | ICD-10-CM | POA: Diagnosis not present

## 2015-11-16 DIAGNOSIS — R194 Change in bowel habit: Secondary | ICD-10-CM | POA: Diagnosis not present

## 2015-11-16 DIAGNOSIS — Z86010 Personal history of colonic polyps: Secondary | ICD-10-CM | POA: Diagnosis not present

## 2015-11-16 DIAGNOSIS — Z7984 Long term (current) use of oral hypoglycemic drugs: Secondary | ICD-10-CM | POA: Diagnosis not present

## 2015-11-24 DIAGNOSIS — R194 Change in bowel habit: Secondary | ICD-10-CM | POA: Diagnosis not present

## 2015-11-24 DIAGNOSIS — R195 Other fecal abnormalities: Secondary | ICD-10-CM | POA: Diagnosis not present

## 2015-11-24 DIAGNOSIS — D124 Benign neoplasm of descending colon: Secondary | ICD-10-CM | POA: Diagnosis not present

## 2015-11-24 DIAGNOSIS — D126 Benign neoplasm of colon, unspecified: Secondary | ICD-10-CM | POA: Diagnosis not present

## 2015-12-26 ENCOUNTER — Encounter (HOSPITAL_COMMUNITY): Payer: Self-pay | Admitting: Nurse Practitioner

## 2015-12-26 ENCOUNTER — Emergency Department (HOSPITAL_COMMUNITY): Payer: Medicare Other

## 2015-12-26 ENCOUNTER — Emergency Department (HOSPITAL_COMMUNITY)
Admission: EM | Admit: 2015-12-26 | Discharge: 2015-12-26 | Disposition: A | Discharge status: Home / Self Care | Payer: Medicare Other | Attending: Emergency Medicine | Admitting: Emergency Medicine

## 2015-12-26 DIAGNOSIS — Z7984 Long term (current) use of oral hypoglycemic drugs: Secondary | ICD-10-CM | POA: Diagnosis not present

## 2015-12-26 DIAGNOSIS — Z7982 Long term (current) use of aspirin: Secondary | ICD-10-CM | POA: Diagnosis not present

## 2015-12-26 DIAGNOSIS — E119 Type 2 diabetes mellitus without complications: Secondary | ICD-10-CM | POA: Insufficient documentation

## 2015-12-26 DIAGNOSIS — Z96652 Presence of left artificial knee joint: Secondary | ICD-10-CM | POA: Diagnosis not present

## 2015-12-26 DIAGNOSIS — R42 Dizziness and giddiness: Secondary | ICD-10-CM | POA: Diagnosis not present

## 2015-12-26 DIAGNOSIS — I10 Essential (primary) hypertension: Secondary | ICD-10-CM | POA: Diagnosis not present

## 2015-12-26 DIAGNOSIS — Z87891 Personal history of nicotine dependence: Secondary | ICD-10-CM | POA: Insufficient documentation

## 2015-12-26 DIAGNOSIS — R001 Bradycardia, unspecified: Secondary | ICD-10-CM | POA: Insufficient documentation

## 2015-12-26 LAB — CBC
HCT: 42.6 % (ref 39.0–52.0)
Hemoglobin: 13.7 g/dL (ref 13.0–17.0)
MCH: 29.2 pg (ref 26.0–34.0)
MCHC: 32.2 g/dL (ref 30.0–36.0)
MCV: 90.8 fL (ref 78.0–100.0)
Platelets: 166 10*3/uL (ref 150–400)
RBC: 4.69 MIL/uL (ref 4.22–5.81)
RDW: 14.3 % (ref 11.5–15.5)
WBC: 7.7 10*3/uL (ref 4.0–10.5)

## 2015-12-26 LAB — BASIC METABOLIC PANEL
Anion gap: 7 (ref 5–15)
BUN: 17 mg/dL (ref 6–20)
CALCIUM: 9.1 mg/dL (ref 8.9–10.3)
CO2: 24 mmol/L (ref 22–32)
Chloride: 105 mmol/L (ref 101–111)
Creatinine, Ser: 1.01 mg/dL (ref 0.61–1.24)
GFR calc Af Amer: >60 mL/min (ref >60)
GLUCOSE: 130 mg/dL — AB (ref 65–99)
Potassium: 4.1 mmol/L (ref 3.5–5.1)
SODIUM: 136 mmol/L (ref 135–145)

## 2015-12-26 LAB — URINALYSIS, ROUTINE W REFLEX MICROSCOPIC
BILIRUBIN URINE: NEGATIVE
GLUCOSE, UA: NEGATIVE mg/dL
Hgb urine dipstick: NEGATIVE
KETONES UR: NEGATIVE mg/dL
Leukocytes, UA: NEGATIVE
Nitrite: NEGATIVE
PROTEIN: NEGATIVE mg/dL
Specific Gravity, Urine: 1.007 (ref 1.005–1.030)
pH: 6 (ref 5.0–8.0)

## 2015-12-26 LAB — I-STAT TROPONIN, ED: TROPONIN I, POC: 0 ng/mL (ref 0.00–0.08)

## 2015-12-26 MED ORDER — MECLIZINE HCL 25 MG PO TABS: 25.0000 mg | ORAL_TABLET | Freq: Once | ORAL | Status: DC

## 2015-12-26 NOTE — ED Provider Notes (Signed)
[facility] DEPT Provider Note   CSN: [id number] Arrival date & time: 12/26/15  1302     History   Chief Complaint Chief Complaint  Patient presents with  . Dizziness    HPI [name] is a 82 y.o. male.  The history is provided by the patient. No language interpreter was used.   [name] is a 82 y.o. male who presents to the Emergency Department complaining of dizziness.   Sxs started at 1222 this morning.  He woke up feeling dizzy with blurred vision.  Sxs now only occur with standing/walking.  Denies chest pain, sob, nausea, vomiting.  He had a history of similar symptoms one year ago but was not examined at that time. He is concerned that his heart rate is too low.   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 Anxiety   . Complication of anesthesia    confusion  . Diabetes mellitus ([place])   . Exotropia of left eye   . H/O hiatal hernia   . Hypercholesteremia   . Hypertension    dr Dagmar Hait     pcp  . Left knee DJD   . S/P total knee arthroplasty 05/13/2007    Patient Active Problem List   Diagnosis Date Noted  . Diabetes mellitus ([place])   . Hypertension   . Hypercholesteremia   . Left knee DJD   . S/P total knee arthroplasty 05/13/2007    Past Surgical History:  Procedure Laterality Date  . APPENDECTOMY    . BACK SURGERY  03/2008   x4  . JOINT REPLACEMENT  2008, 2014  . left shoulder ORIF and rotator cuff repair  07/20/2009  . NASAL SEPTOPLASTY W/ TURBINOPLASTY    . NISSEN FUNDOPLICATION    . STRABISMUS SURGERY Left 09/11/2014   Procedure: REPAIR STRABISMUS LEFT EYE;  Surgeon: Everitt Amber, MD;  Location: [facility];  Service: Ophthalmology;  Laterality: Left;  . TONSILLECTOMY    . TOTAL KNEE ARTHROPLASTY Right 05/13/2007  . TOTAL KNEE ARTHROPLASTY Left 08/05/2012   Procedure: TOTAL KNEE ARTHROPLASTY;  Surgeon: Lorn Junes, MD;  Location: [facility];  Service: Orthopedics;  Laterality: Left;       Home Medications    Prior to Admission medications   Medication Sig Start Date End Date Taking? Authorizing Provider  aspirin EC 81 MG tablet Take 81 mg by mouth daily.   Yes Historical Provider, MD  cetirizine (ZYRTEC) 10 MG tablet Take 10 mg by mouth daily.   Yes Historical Provider, MD  LINZESS 290 MCG CAPS capsule Take 290 mg by mouth daily. 11/08/15  Yes Historical Provider, MD  metFORMIN (GLUCOPHAGE) 500 MG tablet Take 500 mg by mouth 2 (two) times daily.    Yes Historical Provider, MD  OVER THE COUNTER MEDICATION Place 1 spray into both nostrils daily as needed (for congestion). [employer] Nasal Spray   Yes Historical Provider, MD  ramipril (ALTACE) 5 MG capsule Take 5 mg by mouth daily. 12/06/15  Yes Historical Provider, MD  simvastatin (ZOCOR) 20 MG tablet Take 20 mg by mouth every evening.   Yes Historical Provider, MD  aspirin EC 325 MG EC tablet 1 tablet 2 times a day for a month to prevent blood clots Patient not taking: Reported on 12/26/2015 08/06/12   Kirstin Shepperson, PA-C  bisacodyl (DULCOLAX) 5 MG EC  tablet Take 2 tablets every night with dinner until bowel movement.  LAXITIVE.  Restart if two days since last bowel movement Patient not taking: Reported on 12/26/2015 08/06/12   Kirstin Shepperson, PA-C  oxyCODONE-acetaminophen (PERCOCET) 7.5-325 MG per tablet Take 1 tablet by mouth every 4 (four) hours as needed. Patient not taking: Reported on 12/26/2015 09/11/14   Everitt Amber, MD  tobramycin-dexamethasone [facility]) ophthalmic ointment Place 1 application into the left eye 2 (two) times daily. Patient not taking: Reported on 12/26/2015 09/11/14   Everitt Amber, MD    Family History Family History  Problem Relation Age of Onset  . Heart attack Father   . Stroke Maternal Grandfather   . Cancer    . Stroke Mother     Social History Social History  Substance Use Topics  . Smoking status: Former  Smoker    Quit date: 07/30/1983  . Smokeless tobacco: Never Used  . Alcohol use No     Allergies   Review of patient's allergies indicates no known allergies.   Review of Systems Review of Systems  All other systems reviewed and are negative.    Physical Exam Updated Vital Signs BP 146/71 (BP Location: Right Arm)   Pulse (!) 45   Temp 98.7 F (37.1 C) (Oral)   Resp 14   SpO2 97%   Physical Exam  Constitutional: He is oriented to person, place, and time. He appears well-developed and well-nourished. No distress.  HENT:  Head: Normocephalic and atraumatic.  Mouth/Throat: Oropharynx is clear and moist.  TMs clear bilaterally  Eyes: EOM are normal. Pupils are equal, round, and reactive to light.  No nystagmus  Cardiovascular: Regular rhythm.   No murmur heard. Bradycardiac  Pulmonary/Chest: Effort normal and breath sounds normal. No respiratory distress.  Abdominal: Soft. There is no tenderness. There is no rebound and no guarding.  Musculoskeletal: He exhibits no edema or tenderness.  Neurological: He is alert and oriented to person, place, and time.  5 out of 5 strength in all 4 extremities. Sensation to light touch intact in all 4 extremities. No facial asymmetry. No pronator drift.  Skin: Skin is warm and dry.  Psychiatric: He has a normal mood and affect. His behavior is normal.  Nursing note and vitals reviewed.    ED Treatments / Results  Labs (all labs ordered are listed, but only abnormal results are displayed) Labs Reviewed  BASIC METABOLIC PANEL - Abnormal; Notable for the following:       Result Value   Glucose, Bld 130 (*)    All other components within normal limits  CBC  URINALYSIS, ROUTINE W REFLEX MICROSCOPIC (NOT AT [facility])  I-STAT TROPOININ, ED    EKG  EKG Interpretation  Date/Time:  Sunday December 26 2015 13:08:02 EDT Ventricular Rate:  45 PR Interval:  224 QRS Duration: 102 QT Interval:  472 QTC Calculation: 408 R Axis:   71 Text  Interpretation:  Sinus bradycardia with 1st degree A-V block Otherwise normal ECG Confirmed by Hazle Coca [phone]) on 12/26/2015 2:54:32 PM       Radiology Dg Chest 2 View  Result Date: 12/26/2015 CLINICAL DATA:  Pt has been feeling his HR drop. He says every time it happens he gets dizzy and his vision gets blurred. He said it's been happening since 12:22am this morning. Hx of HTN, DM EXAM: CHEST  2 VIEW COMPARISON:  07/29/2012 FINDINGS: Heart size is upper limits normal. There is minimal left lower lobe atelectasis. There are no focal consolidations  or pleural effusions. No pulmonary edema. Thoracic spondylosis noted. Postoperative changes are noted in the left shoulder. IMPRESSION: No evidence for acute cardiopulmonary abnormality. Electronically Signed   By: Nolon Nations M.D.   On: 12/26/2015 16:03   Ct Head Wo Contrast  Result Date: 12/26/2015 CLINICAL DATA:  Onset of dizziness, blurred vision, hand numbness last night. EXAM: CT HEAD WITHOUT CONTRAST TECHNIQUE: Contiguous axial images were obtained from the base of the skull through the vertex without intravenous contrast. COMPARISON:  None. FINDINGS: Brain: No evidence of acute infarction, hemorrhage, hydrocephalus, extra-axial collection or mass lesion/mass effect. Mild generalized cerebral atrophy noted . Vascular: No hyperdense vessel or unexpected calcification. Skull: No skull fracture or other bone lesions identified. Sinuses/Orbits: No acute finding. Other: None. IMPRESSION: No acute intracranial findings.  Mild cerebral atrophy. Electronically Signed   By: Earle Gell M.D.   On: 12/26/2015 16:16    Procedures Procedures (including critical care time)  Medications Ordered in ED Medications - No data to display   Initial Impression / Assessment and Plan / ED Course  I have reviewed the triage vital signs and the nursing notes.  Pertinent labs & imaging results that were available during my care of the patient were reviewed by me  and considered in my medical decision making (see chart for details).  Clinical Course    Patient here for evaluation of intermittent dizziness that is worse with standing. Patient is bradycardic in the emergency department but does not appear to be symptomatic at all during his bradycardia. On repeat assessment in the ED he is able to ambulate without difficulty and has no recurrent dizziness. During ambulation after he returns to his bad his heart rate is 50s to 60s. He is not currently on any medications that would cause bradycardia. Discussed with patient that is unclear if the bradycardia is causative of his dizziness. Now that symptoms are resolved plan to DC home with cardiology follow-up as well as PCP follow-up. Recommend that he get a heart monitor to wear at home. Discussed close return precautions if he develops recurrent symptoms.  Final Clinical Impressions(s) / ED Diagnoses   Final diagnoses:  Dizziness  Sinus bradycardia    New Prescriptions Discharge Medication List as of 12/26/2015  5:19 PM       Quintella Reichert, MD 12/27/15 [phone]

## 2015-12-26 NOTE — ED Notes (Signed)
MD at bedside updating patient.

## 2015-12-26 NOTE — ED Notes (Signed)
Pt transported to xray 

## 2015-12-26 NOTE — ED Triage Notes (Signed)
Pt c/o waking at midnight last night with blurred vision, numbness in hands, dizziness. He was unable to go back to sleep. He reports symptoms remained constant until 0800 this morning, when they resolved.. He has been checking his blood pressure and it has been elevated but his pulse rate has been down. He is alert and breathing easily.

## 2015-12-28 ENCOUNTER — Telehealth: Payer: Self-pay | Admitting: Cardiovascular Disease

## 2015-12-28 NOTE — Telephone Encounter (Deleted)
error 

## 2015-12-29 ENCOUNTER — Encounter: Payer: Self-pay | Admitting: Cardiovascular Disease

## 2015-12-29 ENCOUNTER — Ambulatory Visit (INDEPENDENT_AMBULATORY_CARE_PROVIDER_SITE_OTHER): Payer: Medicare Other | Admitting: Cardiovascular Disease

## 2015-12-29 VITALS — BP 158/62 | HR 64 | Ht 71.0 in | Wt 210.6 lb

## 2015-12-29 DIAGNOSIS — G4719 Other hypersomnia: Secondary | ICD-10-CM | POA: Diagnosis not present

## 2015-12-29 DIAGNOSIS — R0683 Snoring: Secondary | ICD-10-CM | POA: Diagnosis not present

## 2015-12-29 DIAGNOSIS — I10 Essential (primary) hypertension: Secondary | ICD-10-CM

## 2015-12-29 DIAGNOSIS — R42 Dizziness and giddiness: Secondary | ICD-10-CM | POA: Diagnosis not present

## 2015-12-29 DIAGNOSIS — I498 Other specified cardiac arrhythmias: Secondary | ICD-10-CM

## 2015-12-29 DIAGNOSIS — Z125 Encounter for screening for malignant neoplasm of prostate: Secondary | ICD-10-CM | POA: Diagnosis not present

## 2015-12-29 DIAGNOSIS — E7800 Pure hypercholesterolemia, unspecified: Secondary | ICD-10-CM | POA: Diagnosis not present

## 2015-12-29 DIAGNOSIS — Z6828 Body mass index (BMI) 28.0-28.9, adult: Secondary | ICD-10-CM | POA: Diagnosis not present

## 2015-12-29 DIAGNOSIS — R001 Bradycardia, unspecified: Secondary | ICD-10-CM | POA: Diagnosis not present

## 2015-12-29 DIAGNOSIS — E119 Type 2 diabetes mellitus without complications: Secondary | ICD-10-CM | POA: Diagnosis not present

## 2015-12-29 NOTE — Progress Notes (Signed)
Primary MD: Dr Dagmar Hait  PATIENT PROFILE: [name] is a 82 y.o. male who is referred for cardiology evaluation following a recent emergency room visit for dizziness and complaints of slow heart rate.   HPI:  [name] has a long-standing history of diabetes mellitus as well as hypertension.  Early Sunday morning on 12/26/2015.  He was awakened from sleep at 12:22 AM feeling dizzy and had transient blurred vision.  He denied associated chest pain, shortness of breath, nausea or vomiting.  He felt that his pulse was slow.  Reportedly, his heart rate was in the upper 30s to low 40s.  He stayed in bed until approximate 5 AM at that time his blood pressure recording was elevated.  He ultimately went to the emergency room.  His heart rate apparently was somewhat labile.  He underwent a CT which did not reveal any acute intracranial findings.  There was mild cerebral atrophy.  A chest x-ray revealed no evidence for acute cardiopulmonary abnormality.  Thoracic spondylosis was present.  In the emergency room, his blood pressure was 146/71, pulse was 45.  2 saturation was 97%.  Laboratory revealed a glucose 130.  He had a normal urinalysis.  Troponin was negative.  Hemoglobin 13.7, hematocrit 42.6.  Cardiology consultation was recommended with consideration for a cardiac monitor.  The patient was sent home.  Earlier today he went to [facility] and was seen by Laurier Nancy, nurse practitioner.  Laboratory was obtained.  The patient has a follow-up visit to see Dr. Dagmar Hait next week.  He presents for cardiology evaluation.    Past Medical History:  Diagnosis Date  . Anxiety   . Complication of anesthesia    confusion  . Diabetes mellitus ([place])   . Exotropia of left eye   . H/O hiatal hernia   . Hypercholesteremia   . Hypertension    dr Dagmar Hait     pcp  . Left knee DJD   . S/P total knee arthroplasty 05/13/2007    Past Surgical History:  Procedure Laterality Date  . APPENDECTOMY    .  BACK SURGERY  03/2008   x4  . JOINT REPLACEMENT  2008, 2014  . left shoulder ORIF and rotator cuff repair  07/20/2009  . NASAL SEPTOPLASTY W/ TURBINOPLASTY    . NISSEN FUNDOPLICATION    . STRABISMUS SURGERY Left 09/11/2014   Procedure: REPAIR STRABISMUS LEFT EYE;  Surgeon: Everitt Amber, MD;  Location: [place];  Service: Ophthalmology;  Laterality: Left;  . TONSILLECTOMY    . TOTAL KNEE ARTHROPLASTY Right 05/13/2007  . TOTAL KNEE ARTHROPLASTY Left 08/05/2012   Procedure: TOTAL KNEE ARTHROPLASTY;  Surgeon: Lorn Junes, MD;  Location: [place];  Service: Orthopedics;  Laterality: Left;    No Known Allergies  Current Outpatient Prescriptions  Medication Sig Dispense Refill  . aspirin EC 81 MG tablet Take 81 mg by mouth daily.    . cetirizine (ZYRTEC) 10 MG tablet Take 10 mg by mouth daily.    Marland Kitchen LINZESS 290 MCG CAPS capsule Take 290 mg by mouth daily.    . metFORMIN (GLUCOPHAGE) 500 MG tablet Take 500 mg by mouth 2 (two) times daily.     Marland Kitchen OVER THE COUNTER MEDICATION Place 1 spray into both nostrils daily as needed (for congestion). [employer] Nasal Spray    . ramipril (ALTACE) 5 MG capsule Take 5 mg by mouth daily.    . simvastatin (ZOCOR) 20 MG tablet Take 20 mg  by mouth every evening.     No current facility-administered medications for this visit.     Social History   Social History  . Marital status: Married    Spouse name: N/A  . Number of children: N/A  . Years of education: N/A   Occupational History  . Not on file.   Social History Main Topics  . Smoking status: Former Smoker    Quit date: 07/30/1983  . Smokeless tobacco: Never Used  . Alcohol use No  . Drug use: No  . Sexual activity: Not on file   Other Topics Concern  . Not on file   Social History Narrative  . No narrative on file   Socially he is married to my patient, Scot Shiraishi who is the mother of my patient Addison Bailey (previously [employer]).  He is married for 23 years after being  widowed previously.  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  He remains active.  Family History  Problem Relation Age of Onset  . Heart attack Father   . Stroke Maternal Grandfather   . Cancer    . Stroke Mother   . COPD Sister   . Other Brother     accident  . Heart failure Sister   . COPD Brother    Additional family history is notable that his mother died of "old age "at age 82.  His father died at age 109 secondary to leukemia.  His 2 brothers are deceased, one at age 73 with COPD and the other died as a result of an auto accident.  He is 2 deceased sisters, one died at age 45 from COPD and another at age 26 with heart failure.  ROS General: Negative; No fevers, chills, or night sweats HEENT: Negative; No changes in vision or hearing, sinus congestion, difficulty swallowing Pulmonary: Negative; No cough, wheezing, shortness of breath, hemoptysis Cardiovascular:  See HPI;  GI: Negative; No nausea, vomiting, diarrhea, or abdominal pain GU: Negative; No dysuria, hematuria, or difficulty voiding Musculoskeletal: Negative; no myalgias, joint pain, or weakness Hematologic/Oncologic: Negative; no easy bruising, bleeding Endocrine: Negative; no heat/cold intolerance; no diabetes Neuro: Negative; no changes in balance, headaches Skin: Negative; No rashes or skin lesions Psychiatric: Negative; No behavioral problems, depression Sleep: Positive for poor sleep with frequent awakenings at least 4 times per night, snoring, nonrestorative sleep.  no bruxism, restless legs, hypnogagnic hallucinations Other comprehensive 14 point system review is negative   Physical Exam BP (!) 158/62   Pulse 64   Ht '5\' 11"'  (1.803 m)   Wt 210 lb 9.6 oz (95.5 kg)   BMI 29.37 kg/m    Repeat blood pressure by me was 132/70 supine and was 130/72 standing.  There was no significant orthostatic pulse rise  Wt Readings from Last 3  Encounters:  12/29/15 210 lb 9.6 oz (95.5 kg)  09/11/14 217 lb 2 oz (98.5 kg)  07/29/12 213 lb 4.8 oz (96.8 kg)   General: Alert, oriented, no distress.  Skin: normal turgor, no rashes, warm and dry HEENT: Normocephalic, atraumatic. Pupils equal round and reactive to light; sclera anicteric; extraocular muscles intact; Fundi Without hemorrhages or exudates Nose without nasal septal hypertrophy Mouth/Parynx benign; Mallinpatti scale Neck: No JVD, no carotid bruits; normal carotid upstroke Lungs: clear to ausculatation and percussion; no wheezing or rales Chest wall: without tenderness to palpitation Heart: PMI not displaced, RRR, s1 s2 normal, 1/6 systolic murmur in the  aortic area, no diastolic murmur, no rubs, gallops, thrills, or heaves Abdomen: soft, nontender; no hepatosplenomehaly, BS+; abdominal aorta nontender and not dilated by palpation. Back: no CVA tenderness Pulses 2+ Musculoskeletal: full range of motion, normal strength, no joint deformities Extremities: no clubbing cyanosis or edema, Homan's sign negative  Neurologic: grossly nonfocal; Cranial nerves grossly wnl Psychologic: Normal mood and affect   ECG (independently read by me): Sinus rhythm at 64 bpm.  First-degree AV block with PR interval of 236 ms.  No significant ST-T changes.  LABS:  BMP Latest Ref Rng & Units 12/26/2015 09/11/2014 08/06/2012  Glucose 65 - 99 mg/dL 130(H) 147(H) 237(H)  BUN 6 - 20 mg/dL 17 24(H) 25(H)  Creatinine 0.61 - 1.24 mg/dL 1.01 1.10 1.04  Sodium 135 - 145 mmol/L 136 141 138  Potassium 3.5 - 5.1 mmol/L 4.1 5.0 4.4  Chloride 101 - 111 mmol/L 105 106 105  CO2 22 - 32 mmol/L 24 - 20  Calcium 8.9 - 10.3 mg/dL 9.1 - 8.9     Hepatic Function Latest Ref Rng & Units 07/29/2012 04/12/2008 04/10/2008  Total Protein 6.0 - 8.3 g/dL 7.1 6.4 6.4  Albumin 3.5 - 5.2 g/dL 3.9 3.2(L) 3.1(L)  AST 0 - 37 U/L '21 18 18  ' ALT 0 - 53 U/L '20 19 20  ' Alk Phosphatase 39 - 117 U/L 118(H) 98 82  Total Bilirubin  0.3 - 1.2 mg/dL 0.2(L) 0.7 0.6    CBC Latest Ref Rng & Units 12/26/2015 09/11/2014 08/06/2012  WBC 4.0 - 10.5 K/uL 7.7 - 15.0(H)  Hemoglobin 13.0 - 17.0 g/dL 13.7 15.6 13.1  Hematocrit 39.0 - 52.0 % 42.6 46.0 37.5(L)  Platelets 150 - 400 K/uL 166 - 166   Lab Results  Component Value Date   MCV 90.8 12/26/2015   MCV 84.3 08/06/2012   MCV 87.3 08/05/2012   Lab Results  Component Value Date   TSH 2.019 Test methodology is 3rd generation TSH 04/05/2008   Lab Results  Component Value Date   HGBA1C (H) 05/07/2007    7.2 (NOTE)   The ADA recommends the following therapeutic goals for glycemic   control related to Hgb A1C measurement:   Goal of Therapy:   < 7.0% Hgb A1C   Action Suggested:  > 8.0% Hgb A1C   Ref:  Diabetes Care, 22, Suppl. 1, 1999     BNP No results found for: BNP  ProBNP No results found for: PROBNP   Lipid Panel  No results found for: CHOL, TRIG, HDL, CHOLHDL, VLDL, LDLCALC, LDLDIRECT  RADIOLOGY: Dg Chest 2 View  Result Date: 12/26/2015 CLINICAL DATA:  Pt has been feeling his HR drop. He says every time it happens he gets dizzy and his vision gets blurred. He said it's been happening since 12:22am this morning. Hx of HTN, DM EXAM: CHEST  2 VIEW COMPARISON:  07/29/2012 FINDINGS: Heart size is upper limits normal. There is minimal left lower lobe atelectasis. There are no focal consolidations or pleural effusions. No pulmonary edema. Thoracic spondylosis noted. Postoperative changes are noted in the left shoulder. IMPRESSION: No evidence for acute cardiopulmonary abnormality. Electronically Signed   By: Nolon Nations M.D.   On: 12/26/2015 16:03   Ct Head Wo Contrast  Result Date: 12/26/2015 CLINICAL DATA:  Onset of dizziness, blurred vision, hand numbness last night. EXAM: CT HEAD WITHOUT CONTRAST TECHNIQUE: Contiguous axial images were obtained from the base of the skull through the vertex without intravenous contrast. COMPARISON:  None. FINDINGS: Brain: No evidence  of acute infarction, hemorrhage, hydrocephalus, extra-axial collection or mass lesion/mass effect. Mild generalized cerebral atrophy noted . Vascular: No hyperdense vessel or unexpected calcification. Skull: No skull fracture or other bone lesions identified. Sinuses/Orbits: No acute finding. Other: None. IMPRESSION: No acute intracranial findings.  Mild cerebral atrophy. Electronically Signed   By: Earle Gell M.D.   On: 12/26/2015 16:16     ASSESSMENT AND PLAN: Mr. Zlatan Hornback is an 82 year old Caucasian male who has a history of type 2 diabetes mellitus and hypertension.  He recently was awakened from sleep with dizziness and transient blurred vision and felt that his pulse was low.  I reviewed his emergency room evaluation including laboratory and CT findings.  Presently, his blood pressure is controlled on his current dose of ramipril 5 mg.  There are no signs of orthostatic hypotension.  His rhythm is sinus rhythm with first-degree AV block.  I'm concerned that his symptoms developed at night.  With his history of significant snoring, poor sleep, that is nonrestorative, and frequent awakenings, I have raised the concern for the possibility of sleep apnea, which may play a role in bradycardia arrhythmia nocturnally and potential additional rhythm disturbances.  For this reason, I have recommended a sleep study for further evaluation and will schedule this to be done at the [facility] long sleep lab.  He has a systolic murmur in the aortic area which I suspect most likely is due to aortic sclerosis rather than significant stenosis.  I will schedule him for a 2-D echo Doppler evaluation.  Physical examination did not disclose any apparent carotid bruits.  I will also schedule him for 2 week cardiac event monitor for further evaluation of potential arrhythmia.  He apparently had fasting lab work done today.  I will try to obtain these results.  I will see him in 6 weeks for reevaluation or sooner if problems  develop.   Troy Sine, MD, [facility] 12/29/2015 6:05 PM

## 2015-12-29 NOTE — Patient Instructions (Signed)
Your physician has requested that you have an echocardiogram. Echocardiography is a painless test that uses sound waves to create images of your heart. It provides your doctor with information about the size and shape of your heart and how well your heart's chambers and valves are working. This procedure takes approximately one hour. There are no restrictions for this procedure.  Your physician has recommended that you wear an 2 WEEK event monitor. Event monitors are medical devices that record the heart's electrical activity. Doctors most often [place] these monitors to diagnose arrhythmias. Arrhythmias are problems with the speed or rhythm of the heartbeat. The monitor is a small, portable device. You can wear one while you do your normal daily activities. This is usually used to diagnose what is causing palpitations/syncope (passing out).  Your physician has recommended that you have a sleep study. This test records several body functions during sleep, including: brain activity, eye movement, oxygen and carbon dioxide blood levels, heart rate and rhythm, breathing rate and rhythm, the flow of air through your mouth and nose, snoring, body muscle movements, and chest and belly movement. THIS WILL BE DONE AT [facility].  Your physician recommends that you schedule a follow-up appointment in: 6-7 weeks with Dr Claiborne Billings.

## 2016-01-05 ENCOUNTER — Encounter: Payer: Medicare Other | Admitting: Cardiovascular Disease

## 2016-01-05 ENCOUNTER — Encounter: Payer: Self-pay | Admitting: Cardiovascular Disease

## 2016-01-05 DIAGNOSIS — Z0000 Encounter for general adult medical examination without abnormal findings: Secondary | ICD-10-CM | POA: Diagnosis not present

## 2016-01-05 DIAGNOSIS — Z6829 Body mass index (BMI) 29.0-29.9, adult: Secondary | ICD-10-CM | POA: Diagnosis not present

## 2016-01-05 DIAGNOSIS — Z1389 Encounter for screening for other disorder: Secondary | ICD-10-CM | POA: Diagnosis not present

## 2016-01-05 DIAGNOSIS — M1990 Unspecified osteoarthritis, unspecified site: Secondary | ICD-10-CM | POA: Diagnosis not present

## 2016-01-05 DIAGNOSIS — D126 Benign neoplasm of colon, unspecified: Secondary | ICD-10-CM | POA: Diagnosis not present

## 2016-01-05 DIAGNOSIS — K5900 Constipation, unspecified: Secondary | ICD-10-CM | POA: Diagnosis not present

## 2016-01-05 DIAGNOSIS — M479 Spondylosis, unspecified: Secondary | ICD-10-CM | POA: Diagnosis not present

## 2016-01-05 DIAGNOSIS — R972 Elevated prostate specific antigen [PSA]: Secondary | ICD-10-CM | POA: Diagnosis not present

## 2016-01-05 DIAGNOSIS — Z23 Encounter for immunization: Secondary | ICD-10-CM | POA: Diagnosis not present

## 2016-01-05 DIAGNOSIS — R001 Bradycardia, unspecified: Secondary | ICD-10-CM | POA: Diagnosis not present

## 2016-01-05 DIAGNOSIS — I10 Essential (primary) hypertension: Secondary | ICD-10-CM | POA: Diagnosis not present

## 2016-01-05 DIAGNOSIS — E119 Type 2 diabetes mellitus without complications: Secondary | ICD-10-CM | POA: Diagnosis not present

## 2016-01-05 NOTE — Progress Notes (Signed)
This encounter was created in error - please disregard.

## 2016-01-06 DIAGNOSIS — L820 Inflamed seborrheic keratosis: Secondary | ICD-10-CM | POA: Diagnosis not present

## 2016-01-06 DIAGNOSIS — D1801 Hemangioma of skin and subcutaneous tissue: Secondary | ICD-10-CM | POA: Diagnosis not present

## 2016-01-06 DIAGNOSIS — D692 Other nonthrombocytopenic purpura: Secondary | ICD-10-CM | POA: Diagnosis not present

## 2016-01-06 DIAGNOSIS — L570 Actinic keratosis: Secondary | ICD-10-CM | POA: Diagnosis not present

## 2016-01-06 DIAGNOSIS — C44319 Basal cell carcinoma of skin of other parts of face: Secondary | ICD-10-CM | POA: Diagnosis not present

## 2016-01-06 DIAGNOSIS — Z1212 Encounter for screening for malignant neoplasm of rectum: Secondary | ICD-10-CM | POA: Diagnosis not present

## 2016-01-06 DIAGNOSIS — Z85828 Personal history of other malignant neoplasm of skin: Secondary | ICD-10-CM | POA: Diagnosis not present

## 2016-01-06 DIAGNOSIS — L821 Other seborrheic keratosis: Secondary | ICD-10-CM | POA: Diagnosis not present

## 2016-01-06 DIAGNOSIS — D485 Neoplasm of uncertain behavior of skin: Secondary | ICD-10-CM | POA: Diagnosis not present

## 2016-01-07 NOTE — Telephone Encounter (Signed)
error 

## 2016-01-18 ENCOUNTER — Other Ambulatory Visit: Payer: Self-pay | Admitting: Cardiovascular Disease

## 2016-01-18 ENCOUNTER — Ambulatory Visit (INDEPENDENT_AMBULATORY_CARE_PROVIDER_SITE_OTHER): Payer: Medicare Other

## 2016-01-18 ENCOUNTER — Ambulatory Visit (HOSPITAL_COMMUNITY): Payer: Medicare Other | Attending: Cardiology

## 2016-01-18 ENCOUNTER — Other Ambulatory Visit: Payer: Self-pay

## 2016-01-18 DIAGNOSIS — I361 Nonrheumatic tricuspid (valve) insufficiency: Secondary | ICD-10-CM | POA: Diagnosis not present

## 2016-01-18 DIAGNOSIS — I498 Other specified cardiac arrhythmias: Secondary | ICD-10-CM

## 2016-01-18 DIAGNOSIS — I10 Essential (primary) hypertension: Secondary | ICD-10-CM | POA: Insufficient documentation

## 2016-01-18 DIAGNOSIS — I501 Left ventricular failure: Secondary | ICD-10-CM | POA: Diagnosis not present

## 2016-01-18 DIAGNOSIS — I517 Cardiomegaly: Secondary | ICD-10-CM | POA: Insufficient documentation

## 2016-01-18 DIAGNOSIS — R42 Dizziness and giddiness: Secondary | ICD-10-CM

## 2016-01-18 DIAGNOSIS — I440 Atrioventricular block, first degree: Secondary | ICD-10-CM

## 2016-01-18 DIAGNOSIS — I351 Nonrheumatic aortic (valve) insufficiency: Secondary | ICD-10-CM | POA: Diagnosis not present

## 2016-01-18 DIAGNOSIS — R001 Bradycardia, unspecified: Secondary | ICD-10-CM

## 2016-01-24 ENCOUNTER — Ambulatory Visit (HOSPITAL_BASED_OUTPATIENT_CLINIC_OR_DEPARTMENT_OTHER): Payer: Medicare Other | Attending: Cardiovascular Disease | Admitting: Cardiovascular Disease

## 2016-01-24 VITALS — Ht 72.0 in | Wt 202.0 lb

## 2016-01-24 DIAGNOSIS — I498 Other specified cardiac arrhythmias: Secondary | ICD-10-CM

## 2016-01-24 DIAGNOSIS — R0683 Snoring: Secondary | ICD-10-CM

## 2016-01-24 DIAGNOSIS — G4736 Sleep related hypoventilation in conditions classified elsewhere: Secondary | ICD-10-CM | POA: Diagnosis not present

## 2016-01-24 DIAGNOSIS — G4719 Other hypersomnia: Secondary | ICD-10-CM

## 2016-01-24 DIAGNOSIS — G4733 Obstructive sleep apnea (adult) (pediatric): Secondary | ICD-10-CM

## 2016-01-24 DIAGNOSIS — G478 Other sleep disorders: Secondary | ICD-10-CM | POA: Insufficient documentation

## 2016-01-26 DIAGNOSIS — G4733 Obstructive sleep apnea (adult) (pediatric): Secondary | ICD-10-CM | POA: Insufficient documentation

## 2016-01-26 DIAGNOSIS — G4719 Other hypersomnia: Secondary | ICD-10-CM | POA: Insufficient documentation

## 2016-01-26 NOTE — Procedures (Signed)
Patient Name: [name], [name] Date: 01/24/2016 Gender: Male D.O.B: [birth year] Age (years): 77 Referring Provider: Shelva Majestic MD, ABSM Height (inches): 72 Interpreting Physician: Shelva Majestic MD, ABSM Weight (lbs): 202 RPSGT: Zadie Rhine BMI: 27 MRN: [id number] Neck Size: 16.00  CLINICAL INFORMATION Sleep Study Type: NPSG Indication for sleep study: Excessive Daytime Sleepiness, Snoring Epworth Sleepiness Score: 10  SLEEP STUDY TECHNIQUE As per the AASM Manual for the Scoring of Sleep and Associated Events v2.3 (April 2016) with a hypopnea requiring 4% desaturations. The channels recorded and monitored were frontal, central and occipital EEG, electrooculogram (EOG), submentalis EMG (chin), nasal and oral airflow, thoracic and abdominal wall motion, anterior tibialis EMG, snore microphone, electrocardiogram, and pulse oximetry.  MEDICATIONS aspirin EC 81 MG tablet cetirizine (ZYRTEC) 10 MG tablet LINZESS 290 MCG CAPS capsule metFORMIN (GLUCOPHAGE) 500 MG tablet OVER THE COUNTER MEDICATION ramipril (ALTACE) 5 MG capsule simvastatin (ZOCOR) 20 MG tablet  Medications self-administered by patient during sleep study : No sleep medicine administered.  SLEEP ARCHITECTURE The study was initiated at 10:44:40 PM and ended at 5:02:43 AM. Sleep onset time was 72.4 minutes and the sleep efficiency was 32.5%. The total sleep time was 123.0 minutes. Wake after sleep onset (WASO) was 182.6 minutes Stage REM latency was N/A minutes. The patient spent 10.16% of the night in stage N1 sleep, 89.02% in stage N2 sleep, 0.81% in stage N3 and 0.00% in REM. Alpha intrusion was absent. Supine sleep was 59.76%.  RESPIRATORY PARAMETERS The overall apnea/hypopnea index (AHI) was 4.9 per hour. There were 10 total apneas, including 10 obstructive, 0 central and 0 mixed apneas. There were 0 hypopneas and 10 RERAs. The AHI during Stage REM sleep was N/A per hour. AHI while supine was 7.3 per  hour. The mean oxygen saturation was 91.89%. The minimum SpO2 during sleep was 84.00%. Soft snoring was noted during this study.  CARDIAC DATA The 2 lead EKG demonstrated sinus rhythm. The mean heart rate was 51.16 beats per minute. Other EKG findings include: PVCs.  LEG MOVEMENT DATA The total PLMS were 102 with a resulting PLMS index of 49.76. Associated arousal with leg movement index was 0.5 .  IMPRESSIONS - Increased Upper Airway Resistance (UARS) without definitive but borderline sleep apnea (AHI 4.9/h); however, REM sleep was not present and the overall potential severity may be underestimated.  - No significant central sleep apnea occurred during this study (CAI = 0.0/h). - Mild oxygen desaturation to a nadir of 84.00%. - Markedly reduced sleep efficiency. - Abnormal sleep architecture with absence of both slow wave and REM sleep. - The patient snored with Soft snoring volume. - EKG findings include PVCs. - Moderate periodic limb movements of sleep occurred during the study. No significant associated arousals.  DIAGNOSIS - Increased Upper Airway Resistance syndrome (UARS) - Nocturnal Hypoxemia (327.26 [G47.36 ICD-10])  RECOMMENDATIONS - At present, patient does not meet criteria for CPAP therapy: however, REM sleep was not achieved on the present study and the measured AHI may be underestimated if REM sleep had occurred. - Efforts should be made to optimize nasal and oropharyngeal patency - Avoid alcohol, sedatives and other CNS depressants that may worsen sleep apnea and disrupt normal sleep architecture. - If patient is symptomatic with restless legs, consider a trial of pharmacotherapy. - Sleep hygiene should be reviewed to assess factors that may improve sleep quality. - Weight management and regular exercise should be initiated or continued if appropriate. - Consider a follow-up study if progressive symptoms develop.    [  Electronically signed] 01/26/2016 06:51  PM  Shelva Majestic MD, [facility], ABSM Diplomate, American Board of Sleep Medicine   NPI: [id number] [place] PH: [phone]   FX: [phone] [place]

## 2016-02-08 ENCOUNTER — Telehealth: Payer: Self-pay | Admitting: *Deleted

## 2016-02-08 NOTE — Telephone Encounter (Signed)
Left message to return a call to discuss sleep study results. 

## 2016-02-08 NOTE — Progress Notes (Signed)
Left message to return a call to discuss sleep study results. 

## 2016-02-25 NOTE — Progress Notes (Signed)
Patient has appointment 02/29/16 to discuss sleep study.

## 2016-02-29 ENCOUNTER — Ambulatory Visit: Payer: Medicare Other | Admitting: Cardiovascular Disease

## 2016-02-29 ENCOUNTER — Encounter: Payer: Self-pay | Admitting: Cardiovascular Disease

## 2016-02-29 ENCOUNTER — Ambulatory Visit (INDEPENDENT_AMBULATORY_CARE_PROVIDER_SITE_OTHER): Payer: Medicare Other | Admitting: Cardiovascular Disease

## 2016-02-29 VITALS — BP 131/64 | HR 59 | Ht 71.5 in | Wt 215.0 lb

## 2016-02-29 DIAGNOSIS — R0683 Snoring: Secondary | ICD-10-CM | POA: Diagnosis not present

## 2016-02-29 DIAGNOSIS — G4719 Other hypersomnia: Secondary | ICD-10-CM | POA: Diagnosis not present

## 2016-02-29 DIAGNOSIS — I10 Essential (primary) hypertension: Secondary | ICD-10-CM

## 2016-02-29 DIAGNOSIS — E785 Hyperlipidemia, unspecified: Secondary | ICD-10-CM

## 2016-02-29 DIAGNOSIS — I498 Other specified cardiac arrhythmias: Secondary | ICD-10-CM

## 2016-02-29 MED ORDER — ZOLPIDEM TARTRATE 5 MG PO TABS: 5.0000 mg | ORAL_TABLET | Freq: Every evening | ORAL | 1 refills | Status: DC | PRN

## 2016-02-29 NOTE — Patient Instructions (Signed)
Your physician has recommended you make the following change in your medication: start new prescription for generic Ambien.   Your physician recommends that you schedule a follow-up appointment in: 2 months.

## 2016-03-02 NOTE — Progress Notes (Signed)
Primary MD: Dr Dagmar Hait  PATIENT PROFILE: [name] is a 82 y.o. male who presents for 2 month follow-up cardiology evaluation.  HPI:  [name] has a long-standing history of diabetes mellitus as well as hypertension.  Early Sunday morning on 12/26/2015.  He was awakened from sleep at 12:22 AM feeling dizzy and had transient blurred vision.  He denied associated chest pain, shortness of breath, nausea or vomiting.  He felt that his pulse was slow.  Reportedly, his heart rate was in the upper 30s to low 40s.  He stayed in bed until approximate 5 AM at that time his blood pressure recording was elevated.  He ultimately went to the emergency room.  His heart rate apparently was somewhat labile.  He underwent a CT which did not reveal any acute intracranial findings.  There was mild cerebral atrophy.  A chest x-ray revealed no evidence for acute cardiopulmonary abnormality.  Thoracic spondylosis was present.  In the emergency room, his blood pressure was 146/71, pulse was 45.  2 saturation was 97%.  Laboratory revealed a glucose 130.  He had a normal urinalysis.  Troponin was negative.  Hemoglobin 13.7, hematocrit 42.6.   Mr. Olgin wore  a 14 day event monitor from September 26 through 01/31/2016.  This showed predominant sinus rhythm with rare interpolated PVCs.  There were no episodes of atrial fibrillation or significant bradycardia or tachydysrhythmia.  Because of very poor sleep he underwent a sleep study which was done in 01/24/2016.  This study was interpreted by me and showed an AHI of 4.9 overall, suggesting at least increased upper airway resistance, but I suspect he does have underlying sleep apnea.  He had very poor sleep efficiency at only 32.5% and he never achieved REM sleep. For this reason, I feel that this study is suboptimal and I suspect he did not meet definitive sleep apnea criteria as result of inadequate sleep duration and absence of REM sleep.  He also was noted have frequent  periodic limb movements with an index of 49.76.  He does admit to painful restless legs, with the urge to move.  He for evaluation.   Past Medical History:  Diagnosis Date  . Anxiety   . Complication of anesthesia    confusion  . Diabetes mellitus ([place])   . Exotropia of left eye   . H/O hiatal hernia   . Hypercholesteremia   . Hypertension    dr Dagmar Hait     pcp  . Left knee DJD   . S/P total knee arthroplasty 05/13/2007    Past Surgical History:  Procedure Laterality Date  . APPENDECTOMY    . BACK SURGERY  03/2008   x4  . JOINT REPLACEMENT  2008, 2014  . left shoulder ORIF and rotator cuff repair  07/20/2009  . NASAL SEPTOPLASTY W/ TURBINOPLASTY    . NISSEN FUNDOPLICATION    . STRABISMUS SURGERY Left 09/11/2014   Procedure: REPAIR STRABISMUS LEFT EYE;  Surgeon: Everitt Amber, MD;  Location: [place];  Service: Ophthalmology;  Laterality: Left;  . TONSILLECTOMY    . TOTAL KNEE ARTHROPLASTY Right 05/13/2007  . TOTAL KNEE ARTHROPLASTY Left 08/05/2012   Procedure: TOTAL KNEE ARTHROPLASTY;  Surgeon: Lorn Junes, MD;  Location: [place];  Service: Orthopedics;  Laterality: Left;    No Known Allergies  Current Outpatient Prescriptions  Medication Sig Dispense Refill  . aspirin EC 81 MG tablet Take 81 mg by mouth daily.    Marland Kitchen  cetirizine (ZYRTEC) 10 MG tablet Take 10 mg by mouth daily.    Marland Kitchen LINZESS 290 MCG CAPS capsule Take 290 mg by mouth daily.    . metFORMIN (GLUCOPHAGE) 500 MG tablet Take 500 mg by mouth 2 (two) times daily.     Marland Kitchen OVER THE COUNTER MEDICATION Place 1 spray into both nostrils daily as needed (for congestion). [employer] Nasal Spray    . ramipril (ALTACE) 5 MG capsule Take 5 mg by mouth daily.    . simvastatin (ZOCOR) 20 MG tablet Take 20 mg by mouth every evening.    . zolpidem (AMBIEN) 5 MG tablet Take 1 tablet (5 mg total) by mouth at bedtime as needed for sleep. 30 tablet 1   No current facility-administered medications for this visit.     Social  History   Social History  . Marital status: Married    Spouse name: N/A  . Number of children: N/A  . Years of education: N/A   Occupational History  . Not on file.   Social History Main Topics  . Smoking status: Former Smoker    Quit date: 07/30/1983  . Smokeless tobacco: Never Used  . Alcohol use No  . Drug use: No  . Sexual activity: Not on file   Other Topics Concern  . Not on file   Social History Narrative  . No narrative on file   Socially he is married to my patient, [name] who is the mother of my patient [name] (previously [employer]).  He is married for 23 years after being widowed previously.  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  He remains active.  Family History  Problem Relation Age of Onset  . Heart attack Father   . Stroke Maternal Grandfather   . Cancer    . Stroke Mother   . COPD Sister   . Other Brother     accident  . Heart failure Sister   . COPD Brother    Additional family history is notable that his mother died of "old age "at age 41.  His father died at age 90 secondary to leukemia.  His 2 brothers are deceased, one at age 100 with COPD and the other died as a result of an auto accident.  He is 2 deceased sisters, one died at age 52 from COPD and another at age 59 with heart failure.  ROS General: Negative; No fevers, chills, or night sweats HEENT: Negative; No changes in vision or hearing, sinus congestion, difficulty swallowing Pulmonary: Negative; No cough, wheezing, shortness of breath, hemoptysis Cardiovascular:  See HPI;  GI: Negative; No nausea, vomiting, diarrhea, or abdominal pain GU: Negative; No dysuria, hematuria, or difficulty voiding Musculoskeletal: Negative; no myalgias, joint pain, or weakness Hematologic/Oncologic: Negative; no easy bruising, bleeding Endocrine: Negative; no heat/cold intolerance; no diabetes Neuro: Negative; no changes  in balance, headaches Skin: Negative; No rashes or skin lesions Psychiatric: Negative; No behavioral problems, depression Sleep: Positive for poor sleep with frequent awakenings at least 4 times per night, snoring, nonrestorative sleep.  no bruxism, restless legs, hypnogagnic hallucinations Other comprehensive 14 point system review is negative   Physical Exam BP 131/64   Pulse (!) 59   Ht 5' 11.5" (1.816 m)   Wt 215 lb (97.5 kg)   BMI 29.57 kg/m    Repeat blood pressure by me was 110/60 supine   Wt Readings from Last 3 Encounters:  02/29/16  215 lb (97.5 kg)  01/24/16 202 lb (91.6 kg)  01/05/16 209 lb 6 oz (95 kg)   General: Alert, oriented, no distress.  Skin: normal turgor, no rashes, warm and dry HEENT: Normocephalic, atraumatic. Pupils equal round and reactive to light; sclera anicteric; extraocular muscles intact; Fundi Without hemorrhages or exudates Nose without nasal septal hypertrophy Mouth/Parynx benign; Mallinpatti scale Neck: No JVD, no carotid bruits; normal carotid upstroke Lungs: clear to ausculatation and percussion; no wheezing or rales Chest wall: without tenderness to palpitation Heart: PMI not displaced, RRR, s1 s2 normal, 1/6 systolic murmur in the aortic area, no diastolic murmur, no rubs, gallops, thrills, or heaves Abdomen: soft, nontender; no hepatosplenomehaly, BS+; abdominal aorta nontender and not dilated by palpation. Back: no CVA tenderness Pulses 2+ Musculoskeletal: full range of motion, normal strength, no joint deformities Extremities: no clubbing cyanosis or edema, Homan's sign negative  Neurologic: grossly nonfocal; Cranial nerves grossly wnl Psychologic: Normal mood and affect  ECG (independently read by me): Sinus bradycardia 59 bpm with first-degree AV block.  Isolated PAC.  September 2017 ECG (independently read by me): Sinus rhythm at 64 bpm.  First-degree AV block with PR interval of 236 ms.  No significant ST-T  changes.  LABS:  BMP Latest Ref Rng & Units 12/26/2015 09/11/2014 08/06/2012  Glucose 65 - 99 mg/dL 130(H) 147(H) 237(H)  BUN 6 - 20 mg/dL 17 24(H) 25(H)  Creatinine 0.61 - 1.24 mg/dL 1.01 1.10 1.04  Sodium 135 - 145 mmol/L 136 141 138  Potassium 3.5 - 5.1 mmol/L 4.1 5.0 4.4  Chloride 101 - 111 mmol/L 105 106 105  CO2 22 - 32 mmol/L 24 - 20  Calcium 8.9 - 10.3 mg/dL 9.1 - 8.9     Hepatic Function Latest Ref Rng & Units 07/29/2012 04/12/2008 04/10/2008  Total Protein 6.0 - 8.3 g/dL 7.1 6.4 6.4  Albumin 3.5 - 5.2 g/dL 3.9 3.2(L) 3.1(L)  AST 0 - 37 U/L '21 18 18  ' ALT 0 - 53 U/L '20 19 20  ' Alk Phosphatase 39 - 117 U/L 118(H) 98 82  Total Bilirubin 0.3 - 1.2 mg/dL 0.2(L) 0.7 0.6    CBC Latest Ref Rng & Units 12/26/2015 09/11/2014 08/06/2012  WBC 4.0 - 10.5 K/uL 7.7 - 15.0(H)  Hemoglobin 13.0 - 17.0 g/dL 13.7 15.6 13.1  Hematocrit 39.0 - 52.0 % 42.6 46.0 37.5(L)  Platelets 150 - 400 K/uL 166 - 166   Lab Results  Component Value Date   MCV 90.8 12/26/2015   MCV 84.3 08/06/2012   MCV 87.3 08/05/2012   Lab Results  Component Value Date   TSH 2.019 Test methodology is 3rd generation TSH 04/05/2008   Lab Results  Component Value Date   HGBA1C (H) 05/07/2007    7.2 (NOTE)   The ADA recommends the following therapeutic goals for glycemic   control related to Hgb A1C measurement:   Goal of Therapy:   < 7.0% Hgb A1C   Action Suggested:  > 8.0% Hgb A1C   Ref:  Diabetes Care, 22, Suppl. 1, 1999     BNP No results found for: BNP  ProBNP No results found for: PROBNP   Lipid Panel  No results found for: CHOL, TRIG, HDL, CHOLHDL, VLDL, LDLCALC, LDLDIRECT  RADIOLOGY: No results found.   ASSESSMENT AND PLAN: Mr. Kenney Going is an 82 year old Caucasian male who has a history of type 2 diabetes mellitus and hypertension.  He had developed an episode where he was awakened from sleep feeling dizzy and had  a slow pulse in September 2017.  He underwent an echo Doppler study in which showed  normal systolic function with grade 1 diastolic dysfunction.  There was evidence for moderate LA dilatation.  His aortic valve was not stenosed and there was evidence for mild aortic regurgitation.  Due to my concerns for sleep apnea with his very poor sleep history and being awakened from sleep with her 82 arrhythmia.  I scheduled him for sleep study.  In my analysis athough the AHI was 4.9 and <5, I  believe he may very well have more significant sleep apnea.  He had only very poor sleep efficiency and never achieved slow-wave sleep or REM sleep.  He also has probable restless leg syndrome which may also be contributing to some of his reduced sleep efficiency.  I am giving him a prescription for zolpidem 5 mg to see if this can improve his sleep initiation and maintenance.  He may need repeat sleep study with a sleep aid to further assess  probable sleep disordered breathing.  I reviewed his 14 day monitor which did not detect significant arrhythmia and only showed interpolated PVCs.  Blood pressure today is stable on Remeron April 5 milligrams.  He is diabetic on metformin 500 mg twice a day.  He is on simvastatin 20 mg for hyperlipidemia.  Reviewed recent blood work which had been done by his primary physician 12/29/2015.  Hemoglobin A1c was 6.5.  Lipid studies were good with a total cholesterol 103, LDL cholesterol 47, triglycerides 106, but HDL remains low at 35.  I will see him back in the office in 2 months for reevaluation.  Troy Sine, MD, [facility] 03/02/2016 8:23 PM

## 2016-05-03 ENCOUNTER — Encounter: Payer: Self-pay | Admitting: Cardiovascular Disease

## 2016-05-03 ENCOUNTER — Ambulatory Visit (INDEPENDENT_AMBULATORY_CARE_PROVIDER_SITE_OTHER): Payer: Medicare Other | Admitting: Cardiovascular Disease

## 2016-05-03 VITALS — BP 136/60 | HR 75 | Ht 72.0 in | Wt 217.0 lb

## 2016-05-03 DIAGNOSIS — I10 Essential (primary) hypertension: Secondary | ICD-10-CM | POA: Diagnosis not present

## 2016-05-03 DIAGNOSIS — E119 Type 2 diabetes mellitus without complications: Secondary | ICD-10-CM | POA: Diagnosis not present

## 2016-05-03 DIAGNOSIS — I498 Other specified cardiac arrhythmias: Secondary | ICD-10-CM | POA: Diagnosis not present

## 2016-05-03 DIAGNOSIS — G478 Other sleep disorders: Secondary | ICD-10-CM

## 2016-05-03 DIAGNOSIS — E785 Hyperlipidemia, unspecified: Secondary | ICD-10-CM

## 2016-05-03 NOTE — Patient Instructions (Signed)
Your physician wants you to follow-up in: 1 year or sooner if needed. You will receive a reminder letter in the mail two months in advance. If you don't receive a letter, please call our office to schedule the follow-up appointment.   If you need a refill on your cardiac medications before your next appointment, please call your pharmacy.   

## 2016-05-08 DIAGNOSIS — I10 Essential (primary) hypertension: Secondary | ICD-10-CM | POA: Diagnosis not present

## 2016-05-08 DIAGNOSIS — M1990 Unspecified osteoarthritis, unspecified site: Secondary | ICD-10-CM | POA: Diagnosis not present

## 2016-05-08 DIAGNOSIS — E119 Type 2 diabetes mellitus without complications: Secondary | ICD-10-CM | POA: Diagnosis not present

## 2016-05-08 DIAGNOSIS — Z1389 Encounter for screening for other disorder: Secondary | ICD-10-CM | POA: Diagnosis not present

## 2016-05-08 DIAGNOSIS — Z6830 Body mass index (BMI) 30.0-30.9, adult: Secondary | ICD-10-CM | POA: Diagnosis not present

## 2016-05-08 DIAGNOSIS — K5900 Constipation, unspecified: Secondary | ICD-10-CM | POA: Diagnosis not present

## 2016-05-08 DIAGNOSIS — R42 Dizziness and giddiness: Secondary | ICD-10-CM | POA: Diagnosis not present

## 2016-05-08 NOTE — Progress Notes (Signed)
Primary MD: Dr Dagmar Hait  PATIENT PROFILE: [name] is a 83 y.o. male who presents for 2 month follow-up cardiology evaluation.  HPI:  LAJARVIS ITALIANO has a long-standing history of diabetes mellitus as well as hypertension.  Early Sunday morning on 12/26/2015.  He was awakened from sleep at 12:22 AM feeling dizzy and had transient blurred vision.  He denied associated chest pain, shortness of breath, nausea or vomiting.  He felt that his pulse was slow.  Reportedly, his heart rate was in the upper 30s to low 40s.  He stayed in bed until approximate 5 AM at that time his blood pressure recording was elevated.  He ultimately went to the emergency room.  His heart rate apparently was somewhat labile.  He underwent a CT which did not reveal any acute intracranial findings.  There was mild cerebral atrophy.  A chest x-ray revealed no evidence for acute cardiopulmonary abnormality.  Thoracic spondylosis was present.  In the emergency room, his blood pressure was 146/71, pulse was 45.  2 saturation was 97%.  Laboratory revealed a glucose 130.  He had a normal urinalysis.  Troponin was negative.  Hemoglobin 13.7, hematocrit 42.6.   A14 day event monitor from September 26 through 01/31/2016.showed predominant sinus rhythm with rare interpolated PVCs.  There were no episodes of atrial fibrillation or significant bradycardia or tachydysrhythmia.  Because of very poor sleep he underwent a sleep study which was done in 01/24/2016.  This study was interpreted by me and showed an AHI of 4.9 overall, suggesting at least increased upper airway resistance, but I suspect he does have underlying sleep apnea.  He had very poor sleep efficiency at only 32.5% and he never achieved REM sleep. For this reason, I feel that this study is suboptimal and I suspect he did not meet definitive sleep apnea criteria as result of inadequate sleep duration and absence of REM sleep.  He also was noted have frequent periodic limb movements  with an index of 49.76.  He does admit to painful restless legs, with the urge to move.   Since I last saw him, his sleeping has improved with open which he has been taking as needed.  He denies any episodes of palpitations.  Echo Doppler study to 07/18/2015 showed an EF of 60-65%.  There was grade 1 diastolic dysfunction.  Is moderate left atrial dilatation, mild aortic insufficiency.  He denies any chest pain.  He has been taking things S4 significant constipation which has improved his symptomatology.  He continues to take her pill 5 mg for hypertension and is on simvastatin 20 mg for hyperlipidemia.  He is diabetic on metformin.  He presents for reevaluation.   Past Medical History:  Diagnosis Date  . Anxiety   . Complication of anesthesia    confusion  . Diabetes mellitus ([place])   . Exotropia of left eye   . H/O hiatal hernia   . Hypercholesteremia   . Hypertension    dr Dagmar Hait     pcp  . Left knee DJD   . S/P total knee arthroplasty 05/13/2007    Past Surgical History:  Procedure Laterality Date  . APPENDECTOMY    . BACK SURGERY  03/2008   x4  . JOINT REPLACEMENT  2008, 2014  . left shoulder ORIF and rotator cuff repair  07/20/2009  . NASAL SEPTOPLASTY W/ TURBINOPLASTY    . NISSEN FUNDOPLICATION    . STRABISMUS SURGERY Left 09/11/2014   Procedure: REPAIR  STRABISMUS LEFT EYE;  Surgeon: Everitt Amber, MD;  Location: [place];  Service: Ophthalmology;  Laterality: Left;  . TONSILLECTOMY    . TOTAL KNEE ARTHROPLASTY Right 05/13/2007  . TOTAL KNEE ARTHROPLASTY Left 08/05/2012   Procedure: TOTAL KNEE ARTHROPLASTY;  Surgeon: Lorn Junes, MD;  Location: [place];  Service: Orthopedics;  Laterality: Left;    No Known Allergies  Current Outpatient Prescriptions  Medication Sig Dispense Refill  . aspirin EC 81 MG tablet Take 81 mg by mouth daily.    . cetirizine (ZYRTEC) 10 MG tablet Take 10 mg by mouth daily.    Marland Kitchen LINZESS 290 MCG CAPS capsule Take 290 mg by mouth  daily.    . metFORMIN (GLUCOPHAGE) 500 MG tablet Take 500 mg by mouth 2 (two) times daily.     Marland Kitchen OVER THE COUNTER MEDICATION Place 1 spray into both nostrils daily as needed (for congestion). [employer] Nasal Spray    . ramipril (ALTACE) 5 MG capsule Take 5 mg by mouth daily.    . simvastatin (ZOCOR) 20 MG tablet Take 20 mg by mouth every evening.    . zolpidem (AMBIEN) 5 MG tablet Take 1 tablet (5 mg total) by mouth at bedtime as needed for sleep. (Patient taking differently: Take 5 mg by mouth at bedtime. ) 30 tablet 1   No current facility-administered medications for this visit.     Social History   Social History  . Marital status: Married    Spouse name: N/A  . Number of children: N/A  . Years of education: N/A   Occupational History  . Not on file.   Social History Main Topics  . Smoking status: Former Smoker    Quit date: 07/30/1983  . Smokeless tobacco: Never Used  . Alcohol use No  . Drug use: No  . Sexual activity: Not on file   Other Topics Concern  . Not on file   Social History Narrative  . No narrative on file   Socially he is married to my patient, Carel Carrier who is the mother of my patient Addison Bailey (previously [employer]).  He is married for 23 years after being widowed previously.  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  He remains active.  Family History  Problem Relation Age of Onset  . Heart attack Father   . Stroke Maternal Grandfather   . Cancer    . Stroke Mother   . COPD Sister   . Other Brother     accident  . Heart failure Sister   . COPD Brother    Additional family history is notable that his mother died of "old age "at age 79.  His father died at age 75 secondary to leukemia.  His 2 brothers are deceased, one at age 36 with COPD and the other died as a result of an auto accident.  He is 2 deceased sisters, one died at age 93 from COPD and another at age 45 with  heart failure.  ROS General: Negative; No fevers, chills, or night sweats HEENT: Negative; No changes in vision or hearing, sinus congestion, difficulty swallowing Pulmonary: Negative; No cough, wheezing, shortness of breath, hemoptysis Cardiovascular:  See HPI;  GI: Negative; No nausea, vomiting, diarrhea, or abdominal pain GU: Negative; No dysuria, hematuria, or difficulty voiding Musculoskeletal: Negative; no myalgias, joint pain, or weakness Hematologic/Oncologic: Negative; no easy bruising, bleeding Endocrine: Negative; no heat/cold intolerance; no  diabetes Neuro: Negative; no changes in balance, headaches Skin: Negative; No rashes or skin lesions Psychiatric: Negative; No behavioral problems, depression Sleep: Positive for poor sleep with frequent awakenings at least 4 times per night, snoring, nonrestorative sleep.  This has improved with zolpidem.  no bruxism, restless legs, hypnogagnic hallucinations Other comprehensive 14 point system review is negative   Physical Exam BP 136/60   Pulse 75   Ht 6' (1.829 m)   Wt 217 lb (98.4 kg)   BMI 29.43 kg/m    Repeat blood pressure by me was 110/60 supine   Wt Readings from Last 3 Encounters:  05/03/16 217 lb (98.4 kg)  02/29/16 215 lb (97.5 kg)  01/24/16 202 lb (91.6 kg)   General: Alert, oriented, no distress.  Skin: normal turgor, no rashes, warm and dry HEENT: Normocephalic, atraumatic. Pupils equal round and reactive to light; sclera anicteric; extraocular muscles intact; Fundi Without hemorrhages or exudates Nose without nasal septal hypertrophy Mouth/Parynx benign; Mallinpatti scale 3 Neck: No JVD, no carotid bruits; normal carotid upstroke Lungs: clear to ausculatation and percussion; no wheezing or rales Chest wall: without tenderness to palpitation Heart: PMI not displaced, RRR, s1 s2 normal, 1/6 systolic murmur in the aortic area, no diastolic murmur, no rubs, gallops, thrills, or heaves Abdomen: mild diastases  recti; soft, nontender; no hepatosplenomehaly, BS+; abdominal aorta nontender and not dilated by palpation. Back: no CVA tenderness Pulses 2+ Musculoskeletal: full range of motion, normal strength, no joint deformities Extremities: no clubbing cyanosis or edema, Homan's sign negative  Neurologic: grossly nonfocal; Cranial nerves grossly wnl Psychologic: Normal mood and affect  ECG (independently read by me): Normal sinus rhythm at 75 bpm.  First-degree AV block with a PR interval at 214 ms.  Isolated PVCs.  November 2017 ECG (independently read by me): Sinus bradycardia 59 bpm with first-degree AV block.  Isolated PAC.  September 2017 ECG (independently read by me): Sinus rhythm at 64 bpm.  First-degree AV block with PR interval of 236 ms.  No significant ST-T changes.  LABS: Recent laboratory by Dr. Dagmar Hait from 12/29/2015 was reviewed.  BMP Latest Ref Rng & Units 12/26/2015 09/11/2014 08/06/2012  Glucose 65 - 99 mg/dL 130(H) 147(H) 237(H)  BUN 6 - 20 mg/dL 17 24(H) 25(H)  Creatinine 0.61 - 1.24 mg/dL 1.01 1.10 1.04  Sodium 135 - 145 mmol/L 136 141 138  Potassium 3.5 - 5.1 mmol/L 4.1 5.0 4.4  Chloride 101 - 111 mmol/L 105 106 105  CO2 22 - 32 mmol/L 24 - 20  Calcium 8.9 - 10.3 mg/dL 9.1 - 8.9     Hepatic Function Latest Ref Rng & Units 07/29/2012 04/12/2008 04/10/2008  Total Protein 6.0 - 8.3 g/dL 7.1 6.4 6.4  Albumin 3.5 - 5.2 g/dL 3.9 3.2(L) 3.1(L)  AST 0 - 37 U/L _0 ALT 0 - 53 U/L _1 Alk Phosphatase 39 - 117 U/L 118(H) 98 82  Total Bilirubin 0.3 - 1.2 mg/dL 0.2(L) 0.7 0.6    CBC Latest Ref Rng & Units 12/26/2015 09/11/2014 08/06/2012  WBC 4.0 - 10.5 K/uL 7.7 - 15.0(H)  Hemoglobin 13.0 - 17.0 g/dL 13.7 15.6 13.1  Hematocrit 39.0 - 52.0 % 42.6 46.0 37.5(L)  Platelets 150 - 400 K/uL 166 - 166   Lab Results  Component Value Date   MCV 90.8 12/26/2015   MCV 84.3 08/06/2012   MCV 87.3 08/05/2012   Lab Results  Component Value Date   TSH 2.019 Test methodology is 3rd  generation TSH 04/05/2008   Lab Results  Component Value Date   HGBA1C (H) 05/07/2007    7.2 (NOTE)   The ADA recommends the following therapeutic goals for glycemic   control related to Hgb A1C measurement:   Goal of Therapy:   < 7.0% Hgb A1C   Action Suggested:  > 8.0% Hgb A1C   Ref:  Diabetes Care, 22, Suppl. 1, 1999     BNP No results found for: BNP  ProBNP No results found for: PROBNP   Lipid Panel  No results found for: CHOL, TRIG, HDL, CHOLHDL, VLDL, LDLCALC, LDLDIRECT  RADIOLOGY: No results found.  IMPRESSION:  1. Essential hypertension   2. Other cardiac arrhythmia   3. UARS (upper airway resistance syndrome)   4. Type 2 diabetes mellitus without complication, without long-term current use of insulin ([place])   5. Hyperlipidemia with target LDL less than 70     ASSESSMENT AND PLAN: Mr. Brycin Kille is an 83 year old Caucasian male who has a history of type 2 diabetes mellitus and hypertension.  He had developed an episode where he was awakened from sleep feeling dizzy and had a slow pulse in September 2017.  An echo Doppler study  showed normal systolic function with grade 1 diastolic dysfunction.  There was evidence for moderate LA dilatation.  His aortic valve was not stenosed and there was evidence for mild aortic regurgitation.  Due to my concerns for sleep apnea with his very poor sleep history and being awakened from sleep with his arrhtyhmia  sleep study revealed an AHI  4.9 ; however,  I  believe he may very well have more significant sleep apnea.  He had very poor sleep efficiency and never achieved slow-wave sleep or REM sleep.  He also has probable restless leg syndrome which may also be contributing to some of his reduced sleep efficiency.  Sleep has significantly improved with addition of zolpidem, which he has been taken as needed.  He has noticed improvement in his palpitations but still on ECG today, there are rare isolated PVCs.  His blood pressure today is  stable and on repeat by me was 120/70 on ramipril 5 mg daily.  I reviewed recent laboratory by Dr. Dagmar Hait; HbA1c is 6.5.  He is maintained on metformin.  Lipid studies were excellent on simvastatin 20 mg with a total cholesterol 103, triglycerides 106, LDL 47.  HDL remained low at 35.  Liver studies were normal.  TSH was normal at 2.9.  His BMI is 29.43 which is consistent with being overweight, but he is under the obesity threshold.  As long as he remains stable, I will  see him in one year for reevaluation.   Troy Sine, MD, [facility] 05/08/2016 11:58 AM

## 2016-08-14 DIAGNOSIS — L570 Actinic keratosis: Secondary | ICD-10-CM | POA: Diagnosis not present

## 2016-08-14 DIAGNOSIS — L820 Inflamed seborrheic keratosis: Secondary | ICD-10-CM | POA: Diagnosis not present

## 2016-08-14 DIAGNOSIS — L821 Other seborrheic keratosis: Secondary | ICD-10-CM | POA: Diagnosis not present

## 2016-08-14 DIAGNOSIS — Z85828 Personal history of other malignant neoplasm of skin: Secondary | ICD-10-CM | POA: Diagnosis not present

## 2016-08-14 DIAGNOSIS — D1801 Hemangioma of skin and subcutaneous tissue: Secondary | ICD-10-CM | POA: Diagnosis not present

## 2016-09-14 DIAGNOSIS — M1990 Unspecified osteoarthritis, unspecified site: Secondary | ICD-10-CM | POA: Diagnosis not present

## 2016-09-14 DIAGNOSIS — Z6830 Body mass index (BMI) 30.0-30.9, adult: Secondary | ICD-10-CM | POA: Diagnosis not present

## 2016-09-14 DIAGNOSIS — E119 Type 2 diabetes mellitus without complications: Secondary | ICD-10-CM | POA: Diagnosis not present

## 2016-09-14 DIAGNOSIS — F439 Reaction to severe stress, unspecified: Secondary | ICD-10-CM | POA: Diagnosis not present

## 2016-09-14 DIAGNOSIS — I10 Essential (primary) hypertension: Secondary | ICD-10-CM | POA: Diagnosis not present

## 2016-10-10 DIAGNOSIS — H25812 Combined forms of age-related cataract, left eye: Secondary | ICD-10-CM | POA: Diagnosis not present

## 2016-10-10 DIAGNOSIS — H25811 Combined forms of age-related cataract, right eye: Secondary | ICD-10-CM | POA: Diagnosis not present

## 2016-10-10 DIAGNOSIS — H04123 Dry eye syndrome of bilateral lacrimal glands: Secondary | ICD-10-CM | POA: Diagnosis not present

## 2016-10-10 DIAGNOSIS — H35411 Lattice degeneration of retina, right eye: Secondary | ICD-10-CM | POA: Diagnosis not present

## 2016-10-10 DIAGNOSIS — E119 Type 2 diabetes mellitus without complications: Secondary | ICD-10-CM | POA: Diagnosis not present

## 2016-10-23 DIAGNOSIS — H2512 Age-related nuclear cataract, left eye: Secondary | ICD-10-CM | POA: Diagnosis not present

## 2016-11-29 DIAGNOSIS — H2511 Age-related nuclear cataract, right eye: Secondary | ICD-10-CM | POA: Diagnosis not present

## 2016-12-04 DIAGNOSIS — H2511 Age-related nuclear cataract, right eye: Secondary | ICD-10-CM | POA: Diagnosis not present

## 2016-12-04 DIAGNOSIS — H25811 Combined forms of age-related cataract, right eye: Secondary | ICD-10-CM | POA: Diagnosis not present

## 2017-01-29 DIAGNOSIS — Z125 Encounter for screening for malignant neoplasm of prostate: Secondary | ICD-10-CM | POA: Diagnosis not present

## 2017-01-29 DIAGNOSIS — E7800 Pure hypercholesterolemia, unspecified: Secondary | ICD-10-CM | POA: Diagnosis not present

## 2017-01-29 DIAGNOSIS — E119 Type 2 diabetes mellitus without complications: Secondary | ICD-10-CM | POA: Diagnosis not present

## 2017-01-29 DIAGNOSIS — Z0000 Encounter for general adult medical examination without abnormal findings: Secondary | ICD-10-CM | POA: Diagnosis not present

## 2017-01-29 DIAGNOSIS — I10 Essential (primary) hypertension: Secondary | ICD-10-CM | POA: Diagnosis not present

## 2017-02-05 DIAGNOSIS — Z0000 Encounter for general adult medical examination without abnormal findings: Secondary | ICD-10-CM | POA: Diagnosis not present

## 2017-02-05 DIAGNOSIS — K5909 Other constipation: Secondary | ICD-10-CM | POA: Diagnosis not present

## 2017-02-05 DIAGNOSIS — F438 Other reactions to severe stress: Secondary | ICD-10-CM | POA: Diagnosis not present

## 2017-02-05 DIAGNOSIS — D126 Benign neoplasm of colon, unspecified: Secondary | ICD-10-CM | POA: Diagnosis not present

## 2017-02-05 DIAGNOSIS — Z23 Encounter for immunization: Secondary | ICD-10-CM | POA: Diagnosis not present

## 2017-02-05 DIAGNOSIS — N400 Enlarged prostate without lower urinary tract symptoms: Secondary | ICD-10-CM | POA: Diagnosis not present

## 2017-02-05 DIAGNOSIS — R001 Bradycardia, unspecified: Secondary | ICD-10-CM | POA: Diagnosis not present

## 2017-02-05 DIAGNOSIS — R42 Dizziness and giddiness: Secondary | ICD-10-CM | POA: Diagnosis not present

## 2017-02-05 DIAGNOSIS — R609 Edema, unspecified: Secondary | ICD-10-CM | POA: Diagnosis not present

## 2017-02-05 DIAGNOSIS — M1990 Unspecified osteoarthritis, unspecified site: Secondary | ICD-10-CM | POA: Diagnosis not present

## 2017-02-05 DIAGNOSIS — E7849 Other hyperlipidemia: Secondary | ICD-10-CM | POA: Diagnosis not present

## 2017-02-05 DIAGNOSIS — Z6831 Body mass index (BMI) 31.0-31.9, adult: Secondary | ICD-10-CM | POA: Diagnosis not present

## 2017-02-06 DIAGNOSIS — Z1212 Encounter for screening for malignant neoplasm of rectum: Secondary | ICD-10-CM | POA: Diagnosis not present

## 2017-05-31 ENCOUNTER — Encounter: Payer: Self-pay | Admitting: Cardiovascular Disease

## 2017-05-31 ENCOUNTER — Ambulatory Visit (INDEPENDENT_AMBULATORY_CARE_PROVIDER_SITE_OTHER): Payer: Medicare Other | Admitting: Cardiovascular Disease

## 2017-05-31 VITALS — BP 140/69 | HR 55 | Ht 70.0 in | Wt 217.8 lb

## 2017-05-31 DIAGNOSIS — E785 Hyperlipidemia, unspecified: Secondary | ICD-10-CM | POA: Diagnosis not present

## 2017-05-31 DIAGNOSIS — G478 Other sleep disorders: Secondary | ICD-10-CM | POA: Diagnosis not present

## 2017-05-31 DIAGNOSIS — E119 Type 2 diabetes mellitus without complications: Secondary | ICD-10-CM

## 2017-05-31 DIAGNOSIS — I10 Essential (primary) hypertension: Secondary | ICD-10-CM | POA: Diagnosis not present

## 2017-05-31 NOTE — Progress Notes (Signed)
Primary MD: Dr Dagmar Hait  PATIENT PROFILE: [name] is a 84 y.o. male who presents for a 13 month follow-up cardiology evaluation.  HPI:  [name] has a long-standing history of diabetes mellitus as well as hypertension.  Early Sunday morning on 12/26/2015.  He was awakened from sleep at 12:22 AM feeling dizzy and had transient blurred vision.  He denied associated chest pain, shortness of breath, nausea or vomiting.  He felt that his pulse was slow.  Reportedly, his heart rate was in the upper 30s to low 40s.  He stayed in bed until approximate 5 AM at that time his blood pressure recording was elevated.  He ultimately went to the emergency room.  His heart rate apparently was somewhat labile.  He underwent a CT which did not reveal any acute intracranial findings.  There was mild cerebral atrophy.  A chest x-ray revealed no evidence for acute cardiopulmonary abnormality.  Thoracic spondylosis was present.  In the emergency room, his blood pressure was 146/71, pulse was 45.  2 saturation was 97%.  Laboratory revealed a glucose 130.  He had a normal urinalysis.  Troponin was negative.  Hemoglobin 13.7, hematocrit 42.6.   A14 day event monitor from September 26 through 01/31/2016.showed predominant sinus rhythm with rare interpolated PVCs.  There were no episodes of atrial fibrillation or significant bradycardia or tachydysrhythmia.  Because of very poor sleep he underwent a sleep study on 01/24/2016.  This study was interpreted by me and showed an AHI of 4.9 overall, suggesting at least increased upper airway resistance, but I suspect he does have underlying sleep apnea.  He had very poor sleep efficiency at only 32.5% and he never achieved REM sleep. For this reason, I feel that this study is suboptimal and I suspect he did not meet definitive sleep apnea criteria as result of inadequate sleep duration and absence of REM sleep.  He also was noted have frequent periodic limb movements with an  index of 49.76.  He does admit to painful restless legs, with the urge to move.   An  Echo Doppler study to 07/18/2015 showed an EF of 60-65%.  There was grade 1 diastolic dysfunction,  moderate left atrial dilatation, mild aortic insufficiency.   Since I last saw him in January 2018, he has done well.  He is sleeping much better than he had previously.  He denies chest pain.  He denies change in exercise tolerance.  He denies palpitations.  He continues to take ramipril for hypertension.  He is on simvastatin 20 mg for hyperlipidemia.  Laboratory by Dr. although in October 2018 showed an LDL cholesterol at 48.  Hemoglobin A1c was increased at 7.3%.  He is on metformin 500 mg twice a day.  He presents for one-year evaluation.    Past Medical History:  Diagnosis Date  . Anxiety   . Complication of anesthesia    confusion  . Diabetes mellitus ([place])   . Exotropia of left eye   . H/O hiatal hernia   . Hypercholesteremia   . Hypertension    dr Dagmar Hait     pcp  . Left knee DJD   . S/P total knee arthroplasty 05/13/2007    Past Surgical History:  Procedure Laterality Date  . APPENDECTOMY    . BACK SURGERY  03/2008   x4  . JOINT REPLACEMENT  2008, 2014  . left shoulder ORIF and rotator cuff repair  07/20/2009  . NASAL SEPTOPLASTY W/ TURBINOPLASTY    .  NISSEN FUNDOPLICATION    . STRABISMUS SURGERY Left 09/11/2014   Procedure: REPAIR STRABISMUS LEFT EYE;  Surgeon: Everitt Amber, MD;  Location: [facility];  Service: Ophthalmology;  Laterality: Left;  . TONSILLECTOMY    . TOTAL KNEE ARTHROPLASTY Right 05/13/2007  . TOTAL KNEE ARTHROPLASTY Left 08/05/2012   Procedure: TOTAL KNEE ARTHROPLASTY;  Surgeon: Lorn Junes, MD;  Location: [facility];  Service: Orthopedics;  Laterality: Left;    No Known Allergies  Current Outpatient Medications  Medication Sig Dispense Refill  . aspirin EC 81 MG tablet Take 81 mg by mouth daily.    . cetirizine (ZYRTEC) 10 MG tablet Take 10 mg by mouth  daily.    Marland Kitchen LINZESS 290 MCG CAPS capsule Take 290 mg by mouth daily.    . metFORMIN (GLUCOPHAGE) 500 MG tablet Take 500 mg by mouth 2 (two) times daily.     Marland Kitchen OVER THE COUNTER MEDICATION Place 1 spray into both nostrils daily as needed (for congestion). [employer] Nasal Spray    . ramipril (ALTACE) 5 MG capsule Take 5 mg by mouth daily.    . simvastatin (ZOCOR) 20 MG tablet Take 20 mg by mouth every evening.    . zolpidem (AMBIEN) 5 MG tablet Take 1 tablet (5 mg total) by mouth at bedtime as needed for sleep. (Patient taking differently: Take 5 mg by mouth at bedtime. ) 30 tablet 1   No current facility-administered medications for this visit.     Social History   Socioeconomic History  . Marital status: Married    Spouse name: Not on file  . Number of children: Not on file  . Years of education: Not on file  . Highest education level: Not on file  Social Needs  . Financial resource strain: Not on file  . Food insecurity - worry: Not on file  . Food insecurity - inability: Not on file  . Transportation needs - medical: Not on file  . Transportation needs - non-medical: Not on file  Occupational History  . Not on file  Tobacco Use  . Smoking status: Former Smoker    Last attempt to quit: 07/30/1983    Years since quitting: 33.8  . Smokeless tobacco: Never Used  Substance and Sexual Activity  . Alcohol use: No  . Drug use: No  . Sexual activity: Not on file  Other Topics Concern  . Not on file  Social History Narrative  . Not on file   Socially he is married to my patient, [name] who is the mother of my patient [name] (previously [employer]).  He is married for 23 years after being widowed previously.  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  He remains active.  Family History  Problem Relation Age of Onset  . Heart attack Father   . Stroke Maternal Grandfather   . Cancer Unknown   . Stroke  Mother   . COPD Sister   . Other Brother        accident  . Heart failure Sister   . COPD Brother    Additional family history is notable that his mother died of "old age "at age 26.  His father died at age 16 secondary to leukemia.  His 2 brothers are deceased, one at age 29 with COPD and the other died as a result of an auto accident.  He is 2 deceased sisters, one died at  age 29 from COPD and another at age 50 with heart failure.  ROS General: Negative; No fevers, chills, or night sweats HEENT: Negative; No changes in vision or hearing, sinus congestion, difficulty swallowing Pulmonary: Negative; No cough, wheezing, shortness of breath, hemoptysis Cardiovascular:  See HPI;  GI: Negative; No nausea, vomiting, diarrhea, or abdominal pain GU: Negative; No dysuria, hematuria, or difficulty voiding Musculoskeletal: Negative; no myalgias, joint pain, or weakness Hematologic/Oncologic: Negative; no easy bruising, bleeding Endocrine: Negative; no heat/cold intolerance; no diabetes Neuro: Negative; no changes in balance, headaches Skin: Negative; No rashes or skin lesions Psychiatric: Negative; No behavioral problems, depression Sleep: Positive for previously poor sleep with frequent awakenings at least 4 times per night, snoring, nonrestorative sleep.  This improved with zolpidem.  No bruxism, restless legs, hypnogagnic hallucinations Other comprehensive 14 point system review is negative   Physical Exam BP 140/69   Pulse (!) 55   Ht '5\' 10"'  (1.778 m)   Wt 217 lb 12.8 oz (98.8 kg)   BMI 31.25 kg/m    Repeat blood pressure by me was 130/70  Wt Readings from Last 3 Encounters:  05/31/17 217 lb 12.8 oz (98.8 kg)  05/03/16 217 lb (98.4 kg)  02/29/16 215 lb (97.5 kg)    General: Alert, oriented, no distress.  Skin: normal turgor, no rashes, warm and dry HEENT: Normocephalic, atraumatic. Pupils equal round and reactive to light; sclera anicteric; extraocular muscles intact;  Nose  without nasal septal hypertrophy Mouth/Parynx benign; Mallinpatti scale 3 Neck: No JVD, no carotid bruits; normal carotid upstroke Lungs: clear to ausculatation and percussion; no wheezing or rales Chest wall: without tenderness to palpitation Heart: PMI not displaced, RRR, s1 s2 normal, 1/6 systolic murmur, no diastolic murmur, no rubs, gallops, thrills, or heaves Abdomen: Diastases recti soft, nontender; no hepatosplenomehaly, BS+; abdominal aorta nontender and not dilated by palpation. Back: no CVA tenderness Pulses 2+ Musculoskeletal: full range of motion, normal strength, no joint deformities Extremities: no clubbing cyanosis or edema, Homan's sign negative  Neurologic: grossly nonfocal; Cranial nerves grossly wnl Psychologic: Normal mood and affect  ECG (independently read by me): Sinus bradycardia 55 bpm.  First-degree AV block with a PR interval of 228 ms.  January 2018 ECG (independently read by me): Normal sinus rhythm at 75 bpm.  First-degree AV block with a PR interval at 214 ms.  Isolated PVCs.  November 2017 ECG (independently read by me): Sinus bradycardia 59 bpm with first-degree AV block.  Isolated PAC.  September 2017 ECG (independently read by me): Sinus rhythm at 64 bpm.  First-degree AV block with PR interval of 236 ms.  No significant ST-T changes.  LABS: Recent laboratory by Dr. Dagmar Hait from 12/29/2015 was reviewed.  BMP Latest Ref Rng & Units 12/26/2015 09/11/2014 08/06/2012  Glucose 65 - 99 mg/dL 130(H) 147(H) 237(H)  BUN 6 - 20 mg/dL 17 24(H) 25(H)  Creatinine 0.61 - 1.24 mg/dL 1.01 1.10 1.04  Sodium 135 - 145 mmol/L 136 141 138  Potassium 3.5 - 5.1 mmol/L 4.1 5.0 4.4  Chloride 101 - 111 mmol/L 105 106 105  CO2 22 - 32 mmol/L 24 - 20  Calcium 8.9 - 10.3 mg/dL 9.1 - 8.9     Hepatic Function Latest Ref Rng & Units 07/29/2012 04/12/2008 04/10/2008  Total Protein 6.0 - 8.3 g/dL 7.1 6.4 6.4  Albumin 3.5 - 5.2 g/dL 3.9 3.2(L) 3.1(L)  AST 0 - 37 U/L '21 18 18  ' ALT 0  - 53 U/L '20 19 20  ' Alk Phosphatase  39 - 117 U/L 118(H) 98 82  Total Bilirubin 0.3 - 1.2 mg/dL 0.2(L) 0.7 0.6    CBC Latest Ref Rng & Units 12/26/2015 09/11/2014 08/06/2012  WBC 4.0 - 10.5 K/uL 7.7 - 15.0(H)  Hemoglobin 13.0 - 17.0 g/dL 13.7 15.6 13.1  Hematocrit 39.0 - 52.0 % 42.6 46.0 37.5(L)  Platelets 150 - 400 K/uL 166 - 166   Lab Results  Component Value Date   MCV 90.8 12/26/2015   MCV 84.3 08/06/2012   MCV 87.3 08/05/2012   Lab Results  Component Value Date   TSH 2.019 Test methodology is 3rd generation TSH 04/05/2008   Lab Results  Component Value Date   HGBA1C (H) 05/07/2007    7.2 (NOTE)   The ADA recommends the following therapeutic goals for glycemic   control related to Hgb A1C measurement:   Goal of Therapy:   < 7.0% Hgb A1C   Action Suggested:  > 8.0% Hgb A1C   Ref:  Diabetes Care, 22, Suppl. 1, 1999     BNP No results found for: BNP  ProBNP No results found for: PROBNP   Lipid Panel  No results found for: CHOL, TRIG, HDL, CHOLHDL, VLDL, LDLCALC, LDLDIRECT  RADIOLOGY: No results found.  IMPRESSION:  1. Essential hypertension   2. Type 2 diabetes mellitus without complication, without long-term current use of insulin ([place])   3. Hyperlipidemia with target LDL less than 70   4. UARS (upper airway resistance syndrome)     ASSESSMENT AND PLAN: [name] is an 84 year old Caucasian male who has a history of type 2 diabetes mellitus and hypertension.  He had developed an episode where he was awakened from sleep feeling dizzy and had a slow pulse in September 2017.  An echo Doppler study  showed normal systolic function with grade 1 diastolic dysfunction.  There was evidence for moderate LA dilatation.  His aortic valve was not stenosed and there was evidence for mild aortic regurgitation.  Due to  concerns for sleep apnea with his very poor sleep history and being awakened from sleep with his arrhtyhmia  sleep study revealed an AHI  4.9 ; however,  I   believe he may very well have more significant sleep apnea.  He had very poor sleep efficiency and never achieved slow-wave sleep or REM sleep.  He also has probable restless leg syndrome which may also be contributing to some of his reduced sleep efficiency.  Sleep has significantly improved with addition of zolpidem, which he has been taken as needed.  He now believes he is sleepy exceptionally well and denies residual daytime fatigue.  His blood pressure today is stable on ramipril 5 mg daily.  On repeat by me was 130/70.  He has diabetes and is on metformin 500 mg twice a day.  He continues to take simvastatin 20 g for hyperlipidemia.  LDL was excellent at 48.  In October 2018.  He is mildly obese with a BMI of 31.25.  I discussed the importance of weight loss.  We discussed exercise 5 days per week for 30 minutes if possible.  He will follow-up with Dr. Dagmar Hait later this month who will recheck laboratory.  I will see him in one year for reevaluation.  Time spent: 25 minutes Troy Sine, MD, [facility] 06/02/2017 3:09 PM

## 2017-05-31 NOTE — Patient Instructions (Signed)

## 2017-06-02 ENCOUNTER — Encounter: Payer: Self-pay | Admitting: Cardiovascular Disease

## 2017-06-13 DIAGNOSIS — Z6830 Body mass index (BMI) 30.0-30.9, adult: Secondary | ICD-10-CM | POA: Diagnosis not present

## 2017-06-13 DIAGNOSIS — E7849 Other hyperlipidemia: Secondary | ICD-10-CM | POA: Diagnosis not present

## 2017-06-13 DIAGNOSIS — I10 Essential (primary) hypertension: Secondary | ICD-10-CM | POA: Diagnosis not present

## 2017-06-13 DIAGNOSIS — E119 Type 2 diabetes mellitus without complications: Secondary | ICD-10-CM | POA: Diagnosis not present

## 2017-06-13 DIAGNOSIS — F438 Other reactions to severe stress: Secondary | ICD-10-CM | POA: Diagnosis not present

## 2017-06-13 DIAGNOSIS — K5909 Other constipation: Secondary | ICD-10-CM | POA: Diagnosis not present

## 2017-08-21 DIAGNOSIS — D485 Neoplasm of uncertain behavior of skin: Secondary | ICD-10-CM | POA: Diagnosis not present

## 2017-08-21 DIAGNOSIS — Z85828 Personal history of other malignant neoplasm of skin: Secondary | ICD-10-CM | POA: Diagnosis not present

## 2017-08-21 DIAGNOSIS — L821 Other seborrheic keratosis: Secondary | ICD-10-CM | POA: Diagnosis not present

## 2017-08-21 DIAGNOSIS — L570 Actinic keratosis: Secondary | ICD-10-CM | POA: Diagnosis not present

## 2017-08-21 DIAGNOSIS — I788 Other diseases of capillaries: Secondary | ICD-10-CM | POA: Diagnosis not present

## 2017-08-21 DIAGNOSIS — L820 Inflamed seborrheic keratosis: Secondary | ICD-10-CM | POA: Diagnosis not present

## 2017-08-23 DIAGNOSIS — R0609 Other forms of dyspnea: Secondary | ICD-10-CM | POA: Diagnosis not present

## 2017-08-23 DIAGNOSIS — R05 Cough: Secondary | ICD-10-CM | POA: Diagnosis not present

## 2017-08-23 DIAGNOSIS — J209 Acute bronchitis, unspecified: Secondary | ICD-10-CM | POA: Diagnosis not present

## 2017-08-23 DIAGNOSIS — Z6831 Body mass index (BMI) 31.0-31.9, adult: Secondary | ICD-10-CM | POA: Diagnosis not present

## 2017-11-06 DIAGNOSIS — E1169 Type 2 diabetes mellitus with other specified complication: Secondary | ICD-10-CM | POA: Diagnosis not present

## 2017-11-06 DIAGNOSIS — I10 Essential (primary) hypertension: Secondary | ICD-10-CM | POA: Diagnosis not present

## 2017-11-06 DIAGNOSIS — F439 Reaction to severe stress, unspecified: Secondary | ICD-10-CM | POA: Diagnosis not present

## 2017-11-06 DIAGNOSIS — Z1389 Encounter for screening for other disorder: Secondary | ICD-10-CM | POA: Diagnosis not present

## 2017-11-06 DIAGNOSIS — E7849 Other hyperlipidemia: Secondary | ICD-10-CM | POA: Diagnosis not present

## 2017-11-06 DIAGNOSIS — Z6830 Body mass index (BMI) 30.0-30.9, adult: Secondary | ICD-10-CM | POA: Diagnosis not present

## 2017-11-06 DIAGNOSIS — B351 Tinea unguium: Secondary | ICD-10-CM | POA: Diagnosis not present

## 2017-11-06 DIAGNOSIS — J309 Allergic rhinitis, unspecified: Secondary | ICD-10-CM | POA: Diagnosis not present

## 2018-01-26 DIAGNOSIS — Z23 Encounter for immunization: Secondary | ICD-10-CM | POA: Diagnosis not present

## 2018-01-30 DIAGNOSIS — H01024 Squamous blepharitis left upper eyelid: Secondary | ICD-10-CM | POA: Diagnosis not present

## 2018-01-30 DIAGNOSIS — H01021 Squamous blepharitis right upper eyelid: Secondary | ICD-10-CM | POA: Diagnosis not present

## 2018-01-30 DIAGNOSIS — E119 Type 2 diabetes mellitus without complications: Secondary | ICD-10-CM | POA: Diagnosis not present

## 2018-01-30 DIAGNOSIS — H04123 Dry eye syndrome of bilateral lacrimal glands: Secondary | ICD-10-CM | POA: Diagnosis not present

## 2018-01-30 DIAGNOSIS — H01022 Squamous blepharitis right lower eyelid: Secondary | ICD-10-CM | POA: Diagnosis not present

## 2018-01-30 DIAGNOSIS — Z961 Presence of intraocular lens: Secondary | ICD-10-CM | POA: Diagnosis not present

## 2018-01-30 DIAGNOSIS — H35411 Lattice degeneration of retina, right eye: Secondary | ICD-10-CM | POA: Diagnosis not present

## 2018-01-30 DIAGNOSIS — H01025 Squamous blepharitis left lower eyelid: Secondary | ICD-10-CM | POA: Diagnosis not present

## 2018-02-20 DIAGNOSIS — L820 Inflamed seborrheic keratosis: Secondary | ICD-10-CM | POA: Diagnosis not present

## 2018-02-20 DIAGNOSIS — D485 Neoplasm of uncertain behavior of skin: Secondary | ICD-10-CM | POA: Diagnosis not present

## 2018-02-20 DIAGNOSIS — L570 Actinic keratosis: Secondary | ICD-10-CM | POA: Diagnosis not present

## 2018-02-20 DIAGNOSIS — Z85828 Personal history of other malignant neoplasm of skin: Secondary | ICD-10-CM | POA: Diagnosis not present

## 2018-03-06 DIAGNOSIS — R82998 Other abnormal findings in urine: Secondary | ICD-10-CM | POA: Diagnosis not present

## 2018-03-06 DIAGNOSIS — I10 Essential (primary) hypertension: Secondary | ICD-10-CM | POA: Diagnosis not present

## 2018-03-06 DIAGNOSIS — E1169 Type 2 diabetes mellitus with other specified complication: Secondary | ICD-10-CM | POA: Diagnosis not present

## 2018-03-06 DIAGNOSIS — Z125 Encounter for screening for malignant neoplasm of prostate: Secondary | ICD-10-CM | POA: Diagnosis not present

## 2018-03-06 DIAGNOSIS — E7800 Pure hypercholesterolemia, unspecified: Secondary | ICD-10-CM | POA: Diagnosis not present

## 2018-03-14 DIAGNOSIS — Z0000 Encounter for general adult medical examination without abnormal findings: Secondary | ICD-10-CM | POA: Diagnosis not present

## 2018-03-14 DIAGNOSIS — E1169 Type 2 diabetes mellitus with other specified complication: Secondary | ICD-10-CM | POA: Diagnosis not present

## 2018-03-14 DIAGNOSIS — Z1389 Encounter for screening for other disorder: Secondary | ICD-10-CM | POA: Diagnosis not present

## 2018-03-14 DIAGNOSIS — F439 Reaction to severe stress, unspecified: Secondary | ICD-10-CM | POA: Diagnosis not present

## 2018-03-14 DIAGNOSIS — J3089 Other allergic rhinitis: Secondary | ICD-10-CM | POA: Diagnosis not present

## 2018-03-14 DIAGNOSIS — D126 Benign neoplasm of colon, unspecified: Secondary | ICD-10-CM | POA: Diagnosis not present

## 2018-03-14 DIAGNOSIS — R808 Other proteinuria: Secondary | ICD-10-CM | POA: Diagnosis not present

## 2018-03-14 DIAGNOSIS — E7849 Other hyperlipidemia: Secondary | ICD-10-CM | POA: Diagnosis not present

## 2018-03-14 DIAGNOSIS — I10 Essential (primary) hypertension: Secondary | ICD-10-CM | POA: Diagnosis not present

## 2018-03-14 DIAGNOSIS — Z6830 Body mass index (BMI) 30.0-30.9, adult: Secondary | ICD-10-CM | POA: Diagnosis not present

## 2018-03-14 DIAGNOSIS — N400 Enlarged prostate without lower urinary tract symptoms: Secondary | ICD-10-CM | POA: Diagnosis not present

## 2018-03-14 DIAGNOSIS — M1990 Unspecified osteoarthritis, unspecified site: Secondary | ICD-10-CM | POA: Diagnosis not present

## 2018-07-16 DIAGNOSIS — E1169 Type 2 diabetes mellitus with other specified complication: Secondary | ICD-10-CM | POA: Diagnosis not present

## 2018-07-16 DIAGNOSIS — F439 Reaction to severe stress, unspecified: Secondary | ICD-10-CM | POA: Diagnosis not present

## 2018-07-16 DIAGNOSIS — M1990 Unspecified osteoarthritis, unspecified site: Secondary | ICD-10-CM | POA: Diagnosis not present

## 2018-07-16 DIAGNOSIS — Z1331 Encounter for screening for depression: Secondary | ICD-10-CM | POA: Diagnosis not present

## 2018-07-16 DIAGNOSIS — I10 Essential (primary) hypertension: Secondary | ICD-10-CM | POA: Diagnosis not present

## 2018-07-16 DIAGNOSIS — N400 Enlarged prostate without lower urinary tract symptoms: Secondary | ICD-10-CM | POA: Diagnosis not present

## 2018-08-20 DIAGNOSIS — L821 Other seborrheic keratosis: Secondary | ICD-10-CM | POA: Diagnosis not present

## 2018-08-20 DIAGNOSIS — L570 Actinic keratosis: Secondary | ICD-10-CM | POA: Diagnosis not present

## 2018-08-20 DIAGNOSIS — D485 Neoplasm of uncertain behavior of skin: Secondary | ICD-10-CM | POA: Diagnosis not present

## 2018-08-20 DIAGNOSIS — Z85828 Personal history of other malignant neoplasm of skin: Secondary | ICD-10-CM | POA: Diagnosis not present

## 2018-08-22 ENCOUNTER — Ambulatory Visit: Payer: Medicare Other | Admitting: Cardiovascular Disease

## 2018-09-11 DIAGNOSIS — M5431 Sciatica, right side: Secondary | ICD-10-CM | POA: Diagnosis not present

## 2018-10-02 DIAGNOSIS — M5431 Sciatica, right side: Secondary | ICD-10-CM | POA: Diagnosis not present

## 2018-10-11 DIAGNOSIS — M545 Low back pain: Secondary | ICD-10-CM | POA: Diagnosis not present

## 2018-10-21 DIAGNOSIS — M545 Low back pain: Secondary | ICD-10-CM | POA: Diagnosis not present

## 2018-10-21 DIAGNOSIS — M48061 Spinal stenosis, lumbar region without neurogenic claudication: Secondary | ICD-10-CM | POA: Diagnosis not present

## 2018-10-28 DIAGNOSIS — M48061 Spinal stenosis, lumbar region without neurogenic claudication: Secondary | ICD-10-CM | POA: Diagnosis not present

## 2018-10-28 DIAGNOSIS — M545 Low back pain: Secondary | ICD-10-CM | POA: Diagnosis not present

## 2018-11-11 DIAGNOSIS — M545 Low back pain: Secondary | ICD-10-CM | POA: Diagnosis not present

## 2018-11-11 DIAGNOSIS — M48061 Spinal stenosis, lumbar region without neurogenic claudication: Secondary | ICD-10-CM | POA: Diagnosis not present

## 2018-11-12 ENCOUNTER — Telehealth: Payer: Self-pay | Admitting: Cardiovascular Disease

## 2018-11-12 DIAGNOSIS — M1990 Unspecified osteoarthritis, unspecified site: Secondary | ICD-10-CM | POA: Diagnosis not present

## 2018-11-12 DIAGNOSIS — E1169 Type 2 diabetes mellitus with other specified complication: Secondary | ICD-10-CM | POA: Diagnosis not present

## 2018-11-12 DIAGNOSIS — R809 Proteinuria, unspecified: Secondary | ICD-10-CM | POA: Diagnosis not present

## 2018-11-12 DIAGNOSIS — N400 Enlarged prostate without lower urinary tract symptoms: Secondary | ICD-10-CM | POA: Diagnosis not present

## 2018-11-12 DIAGNOSIS — I10 Essential (primary) hypertension: Secondary | ICD-10-CM | POA: Diagnosis not present

## 2018-11-12 DIAGNOSIS — J309 Allergic rhinitis, unspecified: Secondary | ICD-10-CM | POA: Diagnosis not present

## 2018-11-12 DIAGNOSIS — E785 Hyperlipidemia, unspecified: Secondary | ICD-10-CM | POA: Diagnosis not present

## 2018-11-12 DIAGNOSIS — F439 Reaction to severe stress, unspecified: Secondary | ICD-10-CM | POA: Diagnosis not present

## 2018-11-12 NOTE — Telephone Encounter (Signed)

## 2018-11-13 ENCOUNTER — Encounter: Payer: Self-pay | Admitting: Cardiovascular Disease

## 2018-11-13 ENCOUNTER — Ambulatory Visit (INDEPENDENT_AMBULATORY_CARE_PROVIDER_SITE_OTHER): Payer: Medicare Other | Admitting: Cardiovascular Disease

## 2018-11-13 ENCOUNTER — Other Ambulatory Visit: Payer: Self-pay

## 2018-11-13 VITALS — BP 120/65 | HR 52 | Ht 71.0 in | Wt 212.6 lb

## 2018-11-13 DIAGNOSIS — E1169 Type 2 diabetes mellitus with other specified complication: Secondary | ICD-10-CM | POA: Diagnosis not present

## 2018-11-13 DIAGNOSIS — G478 Other sleep disorders: Secondary | ICD-10-CM | POA: Diagnosis not present

## 2018-11-13 DIAGNOSIS — M545 Low back pain, unspecified: Secondary | ICD-10-CM

## 2018-11-13 DIAGNOSIS — E119 Type 2 diabetes mellitus without complications: Secondary | ICD-10-CM

## 2018-11-13 DIAGNOSIS — G8929 Other chronic pain: Secondary | ICD-10-CM | POA: Diagnosis not present

## 2018-11-13 DIAGNOSIS — E785 Hyperlipidemia, unspecified: Secondary | ICD-10-CM

## 2018-11-13 DIAGNOSIS — I10 Essential (primary) hypertension: Secondary | ICD-10-CM | POA: Diagnosis not present

## 2018-11-13 NOTE — Patient Instructions (Signed)

## 2018-11-13 NOTE — Progress Notes (Signed)
Primary MD: Dr Dagmar Hait  PATIENT PROFILE: [name] is a 85 y.o. male who presents for a 17 month follow-up cardiology evaluation.  HPI:  [name] has a long-standing history of diabetes mellitus as well as hypertension.  Early Sunday morning on 12/26/2015 he was awakened from sleep at 12:22 AM feeling dizzy and had transient blurred vision.  He denied associated chest pain, shortness of breath, nausea or vomiting.  He felt that his pulse was slow.  Reportedly, his heart rate was in the upper 30s to low 40s.  He stayed in bed until approximate 5 AM at that time his blood pressure recording was elevated.  He ultimately went to the emergency room.  His heart rate apparently was somewhat labile.  He underwent a CT which did not reveal any acute intracranial findings.  There was mild cerebral atrophy.  A chest x-ray revealed no evidence for acute cardiopulmonary abnormality.  Thoracic spondylosis was present.  In the emergency room, his blood pressure was 146/71, pulse was 45.  2 saturation was 97%.  Laboratory revealed a glucose 130.  He had a normal urinalysis.  Troponin was negative.  Hemoglobin 13.7, hematocrit 42.6.   A14 day event monitor from September 26 through 01/31/2016 showed predominant sinus rhythm with rare interpolated PVCs.  There were no episodes of atrial fibrillation or significant bradycardia or tachydysrhythmia.  Because of very poor sleep he underwent a sleep study on 01/24/2016.  This study was interpreted by me and showed an AHI of 4.9 overall, suggesting at least increased upper airway resistance, but I suspect he does have underlying sleep apnea.  He had very poor sleep efficiency at only 32.5% and he never achieved REM sleep. For this reason, I feel that this study is suboptimal and I suspect he did not meet definitive sleep apnea criteria as result of inadequate sleep duration and absence of REM sleep.  He also was noted have frequent periodic limb movements with an index  of 49.76.  He does admit to painful restless legs, with the urge to move.   An  Echo Doppler study to 07/18/2015 showed an EF of 60-65%.  There was grade 1 diastolic dysfunction,  moderate left atrial dilatation, mild aortic insufficiency.   Since I  saw him in January 2018, he has done well.  He is sleeping much better than he had previously.  He denies chest pain.  He denies change in exercise tolerance.  He denies palpitations.  He continues to take ramipril for hypertension.  He is on simvastatin 20 mg for hyperlipidemia.  Laboratory by Dr. Dagmar Hait in October 2018 showed an LDL cholesterol at 48.  Hemoglobin A1c was increased at 7.3%.  He is on metformin 500 mg twice a day.    I last saw him in February 2019.  At that time weight was 217.  He continues to be on ramipril 5 mg for hypertension and simvastatin 20 mg.  He will be undergoing laboratory this week with his primary physician.  He is diabetic on metformin 500 mg twice a day.  He experiences occasional low back discomfort.  He denies chest pain PND orthopnea he denies any dizziness.  He remains active doing yard work and gardening.  He feels that he is sleeping well.  He presents for a follow-up evaluation.   Past Medical History:  Diagnosis Date  . Anxiety   . Complication of anesthesia    confusion  . Diabetes mellitus ([place])   .  Exotropia of left eye   . H/O hiatal hernia   . Hypercholesteremia   . Hypertension    dr Dagmar Hait     pcp  . Left knee DJD   . S/P total knee arthroplasty 05/13/2007    Past Surgical History:  Procedure Laterality Date  . APPENDECTOMY    . BACK SURGERY  03/2008   x4  . JOINT REPLACEMENT  2008, 2014  . left shoulder ORIF and rotator cuff repair  07/20/2009  . NASAL SEPTOPLASTY W/ TURBINOPLASTY    . NISSEN FUNDOPLICATION    . STRABISMUS SURGERY Left 09/11/2014   Procedure: REPAIR STRABISMUS LEFT EYE;  Surgeon: Everitt Amber, MD;  Location: [facility];  Service: Ophthalmology;  Laterality:  Left;  . TONSILLECTOMY    . TOTAL KNEE ARTHROPLASTY Right 05/13/2007  . TOTAL KNEE ARTHROPLASTY Left 08/05/2012   Procedure: TOTAL KNEE ARTHROPLASTY;  Surgeon: Lorn Junes, MD;  Location: [facility];  Service: Orthopedics;  Laterality: Left;    No Known Allergies  Current Outpatient Medications  Medication Sig Dispense Refill  . aspirin EC 81 MG tablet Take 81 mg by mouth daily.    . cetirizine (ZYRTEC) 10 MG tablet Take 10 mg by mouth daily.    Marland Kitchen LINZESS 290 MCG CAPS capsule Take 290 mg by mouth daily.    . metFORMIN (GLUCOPHAGE) 500 MG tablet Take 500 mg by mouth 2 (two) times daily.     Marland Kitchen OVER THE COUNTER MEDICATION Place 1 spray into both nostrils daily as needed (for congestion). [employer] Nasal Spray    . ramipril (ALTACE) 5 MG capsule Take 5 mg by mouth daily.    . simvastatin (ZOCOR) 20 MG tablet Take 20 mg by mouth every evening.    . zolpidem (AMBIEN) 5 MG tablet Take 1 tablet (5 mg total) by mouth at bedtime as needed for sleep. (Patient taking differently: Take 5 mg by mouth at bedtime. ) 30 tablet 1   No current facility-administered medications for this visit.     Social History   Socioeconomic History  . Marital status: Married    Spouse name: Not on file  . Number of children: Not on file  . Years of education: Not on file  . Highest education level: Not on file  Occupational History  . Not on file  Social Needs  . Financial resource strain: Not on file  . Food insecurity    Worry: Not on file    Inability: Not on file  . Transportation needs    Medical: Not on file    Non-medical: Not on file  Tobacco Use  . Smoking status: Former Smoker    Quit date: 07/30/1983    Years since quitting: 35.3  . Smokeless tobacco: Never Used  Substance and Sexual Activity  . Alcohol use: No  . Drug use: No  . Sexual activity: Not on file  Lifestyle  . Physical activity    Days per week: Not on file    Minutes per session: Not on file  . Stress: Not on file   Relationships  . Social Herbalist on phone: Not on file    Gets together: Not on file    Attends religious service: Not on file    Active member of club or organization: Not on file    Attends meetings of clubs or organizations: Not on file    Relationship status: Not on file  . Intimate partner violence  Fear of current or ex partner: Not on file    Emotionally abused: Not on file    Physically abused: Not on file    Forced sexual activity: Not on file  Other Topics Concern  . Not on file  Social History Narrative  . Not on file   Socially he is married to my patient, [name] who is the mother of my patient [name] (previously [employer]).  He is married for 23 years after being widowed previously.  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  He remains active.  Family History  Problem Relation Age of Onset  . Heart attack Father   . Stroke Maternal Grandfather   . Cancer Unknown   . Stroke Mother   . COPD Sister   . Other Brother        accident  . Heart failure Sister   . COPD Brother    Additional family history is notable that his mother died of "old age "at age 80.  His father died at age 57 secondary to leukemia.  His 2 brothers are deceased, one at age 46 with COPD and the other died as a result of an auto accident.  He is 2 deceased sisters, one died at age 62 from COPD and another at age 52 with heart failure.  ROS General: Negative; No fevers, chills, or night sweats HEENT: Negative; No changes in vision or hearing, sinus congestion, difficulty swallowing Pulmonary: Negative; No cough, wheezing, shortness of breath, hemoptysis Cardiovascular:  See HPI;  GI: Negative; No nausea, vomiting, diarrhea, or abdominal pain GU: Negative; No dysuria, hematuria, or difficulty voiding Musculoskeletal: Low back pain discomfort Hematologic/Oncologic: Negative; no easy bruising,  bleeding Endocrine: Negative; no heat/cold intolerance; no diabetes Neuro: Negative; no changes in balance, headaches Skin: Negative; No rashes or skin lesions Psychiatric: Negative; No behavioral problems, depression Sleep: Positive for previously poor sleep with frequent awakenings at least 4 times per night, snoring, nonrestorative sleep.  This improved with zolpidem.  No bruxism, restless legs, hypnogagnic hallucinations Other comprehensive 14 point system review is negative   Physical Exam BP 120/65   Pulse (!) 52   Ht '5\' 11"'  (1.803 m)   Wt 212 lb 9.6 oz (96.4 kg)   SpO2 95%   BMI 29.65 kg/m    Repeat blood pressure by me was 116/68  Wt Readings from Last 3 Encounters:  11/13/18 212 lb 9.6 oz (96.4 kg)  05/31/17 217 lb 12.8 oz (98.8 kg)  05/03/16 217 lb (98.4 kg)   General: Alert, oriented, no distress.  Skin: normal turgor, no rashes, warm and dry HEENT: Normocephalic, atraumatic. Pupils equal round and reactive to light; sclera anicteric; extraocular muscles intact;  Nose without nasal septal hypertrophy Mouth/Parynx benign; Mallinpatti scale previously noted to be 3, currently wearing a mask Neck: No JVD, no carotid bruits; normal carotid upstroke Lungs: clear to ausculatation and percussion; no wheezing or rales Chest wall: without tenderness to palpitation Heart: PMI not displaced, RRR, s1 s2 normal, 1/6 systolic murmur, no diastolic murmur, no rubs, gallops, thrills, or heaves Abdomen: mild central adiposity; diastases recti; soft, nontender; no hepatosplenomehaly, BS+; abdominal aorta nontender and not dilated by palpation. Back: no CVA tenderness Pulses 2+ Musculoskeletal: full range of motion, normal strength, no joint deformities Extremities: no clubbing cyanosis or edema, Homan's sign negative  Neurologic: grossly nonfocal; Cranial nerves grossly wnl Psychologic: Normal mood and affect   ECG (independently  read by me): Sinus bradycardia at 58 bpm.   First-degree AV block with a PR interval 222 ms.  February 2019 ECG (independently read by me): Sinus bradycardia 55 bpm.  First-degree AV block with a PR interval of 228 ms.  January 2018 ECG (independently read by me): Normal sinus rhythm at 75 bpm.  First-degree AV block with a PR interval at 214 ms.  Isolated PVCs.  November 2017 ECG (independently read by me): Sinus bradycardia 59 bpm with first-degree AV block.  Isolated PAC.  September 2017 ECG (independently read by me): Sinus rhythm at 64 bpm.  First-degree AV block with PR interval of 236 ms.  No significant ST-T changes.  LABS: Recent laboratory by Dr. Dagmar Hait from 12/29/2015 was reviewed.  BMP Latest Ref Rng & Units 12/26/2015 09/11/2014 08/06/2012  Glucose 65 - 99 mg/dL 130(H) 147(H) 237(H)  BUN 6 - 20 mg/dL 17 24(H) 25(H)  Creatinine 0.61 - 1.24 mg/dL 1.01 1.10 1.04  Sodium 135 - 145 mmol/L 136 141 138  Potassium 3.5 - 5.1 mmol/L 4.1 5.0 4.4  Chloride 101 - 111 mmol/L 105 106 105  CO2 22 - 32 mmol/L 24 - 20  Calcium 8.9 - 10.3 mg/dL 9.1 - 8.9     Hepatic Function Latest Ref Rng & Units 07/29/2012 04/12/2008 04/10/2008  Total Protein 6.0 - 8.3 g/dL 7.1 6.4 6.4  Albumin 3.5 - 5.2 g/dL 3.9 3.2(L) 3.1(L)  AST 0 - 37 U/L '21 18 18  ' ALT 0 - 53 U/L '20 19 20  ' Alk Phosphatase 39 - 117 U/L 118(H) 98 82  Total Bilirubin 0.3 - 1.2 mg/dL 0.2(L) 0.7 0.6    CBC Latest Ref Rng & Units 12/26/2015 09/11/2014 08/06/2012  WBC 4.0 - 10.5 K/uL 7.7 - 15.0(H)  Hemoglobin 13.0 - 17.0 g/dL 13.7 15.6 13.1  Hematocrit 39.0 - 52.0 % 42.6 46.0 37.5(L)  Platelets 150 - 400 K/uL 166 - 166   Lab Results  Component Value Date   MCV 90.8 12/26/2015   MCV 84.3 08/06/2012   MCV 87.3 08/05/2012   Lab Results  Component Value Date   TSH 2.019 Test methodology is 3rd generation TSH 04/05/2008   Lab Results  Component Value Date   HGBA1C (H) 05/07/2007    7.2 (NOTE)   The ADA recommends the following therapeutic goals for glycemic   control related to  Hgb A1C measurement:   Goal of Therapy:   < 7.0% Hgb A1C   Action Suggested:  > 8.0% Hgb A1C   Ref:  Diabetes Care, 22, Suppl. 1, 1999     BNP No results found for: BNP  ProBNP No results found for: PROBNP   Lipid Panel  No results found for: CHOL, TRIG, HDL, CHOLHDL, VLDL, LDLCALC, LDLDIRECT  RADIOLOGY: No results found.  IMPRESSION:  1. Essential hypertension   2. UARS (upper airway resistance syndrome)   3. Type 2 diabetes mellitus without complication, without long-term current use of insulin ([place])   4. Hyperlipidemia with target LDL less than 70   5. Chronic low back pain, unspecified back pain laterality, unspecified whether sciatica present     ASSESSMENT AND PLAN: [name] is an 85 year old Caucasian male who has a history of type 2 diabetes mellitus and hypertension.  He had developed an episode where he was awakened from sleep feeling dizzy and had a slow pulse in September 2017.  An echo Doppler study  showed normal systolic function with grade 1 diastolic dysfunction.  There was evidence for  moderate LA dilatation.  His aortic valve was not stenosed and there was evidence for mild aortic regurgitation.  Due to concerns for sleep apnea with his very poor sleep history and being awakened from sleep with his arrhtyhmia  sleep study revealed an AHI  4.9 ; however,  I  believe he may very well have more significant sleep apnea.  He had very poor sleep efficiency and never achieved slow-wave sleep or REM sleep.  He also has probable restless leg syndrome which may also have contributed to some of his reduced sleep efficiency.  Sleep has significantly improved with addition of zolpidem, which he has been taken as needed.  Since I last saw him in February 2019, he believes he is sleeping well.  He has lost 5 pounds.  He continues to be active doing yard work and gardening and has been without chest pain or exertional dyspnea.  Blood pressure today is stable on his current regimen  of ramipril 5 mg daily.  Dr. Elsworth Soho is checking complete set of laboratory this week.  I have recommended that these be sent to me for my review.  His ECG is stable with first-degree heart block and mild sinus bradycardia.  He has some occasional low back discomfort but has remained relatively active.  He continues to be on simvastatin for hyperlipidemia with previous LDL excellent.  I will wait for upcoming laboratory.  I will see him in 1 year for follow-up evaluation or sooner if problems arise.  Time spent: 25 minutes Troy Sine, MD, [facility] 11/15/2018 9:47 AM

## 2018-11-15 ENCOUNTER — Encounter: Payer: Self-pay | Admitting: Cardiovascular Disease

## 2019-01-09 DIAGNOSIS — R351 Nocturia: Secondary | ICD-10-CM | POA: Diagnosis not present

## 2019-01-09 DIAGNOSIS — R3915 Urgency of urination: Secondary | ICD-10-CM | POA: Diagnosis not present

## 2019-01-09 DIAGNOSIS — R3912 Poor urinary stream: Secondary | ICD-10-CM | POA: Diagnosis not present

## 2019-01-09 DIAGNOSIS — N401 Enlarged prostate with lower urinary tract symptoms: Secondary | ICD-10-CM | POA: Diagnosis not present

## 2019-01-25 DIAGNOSIS — Z23 Encounter for immunization: Secondary | ICD-10-CM | POA: Diagnosis not present

## 2019-02-05 DIAGNOSIS — Z961 Presence of intraocular lens: Secondary | ICD-10-CM | POA: Diagnosis not present

## 2019-02-05 DIAGNOSIS — H04123 Dry eye syndrome of bilateral lacrimal glands: Secondary | ICD-10-CM | POA: Diagnosis not present

## 2019-02-05 DIAGNOSIS — E119 Type 2 diabetes mellitus without complications: Secondary | ICD-10-CM | POA: Diagnosis not present

## 2019-02-05 DIAGNOSIS — H0102B Squamous blepharitis left eye, upper and lower eyelids: Secondary | ICD-10-CM | POA: Diagnosis not present

## 2019-02-05 DIAGNOSIS — H0102A Squamous blepharitis right eye, upper and lower eyelids: Secondary | ICD-10-CM | POA: Diagnosis not present

## 2019-02-05 DIAGNOSIS — H35411 Lattice degeneration of retina, right eye: Secondary | ICD-10-CM | POA: Diagnosis not present

## 2019-02-18 DIAGNOSIS — N401 Enlarged prostate with lower urinary tract symptoms: Secondary | ICD-10-CM | POA: Diagnosis not present

## 2019-02-18 DIAGNOSIS — R3912 Poor urinary stream: Secondary | ICD-10-CM | POA: Diagnosis not present

## 2019-02-18 DIAGNOSIS — N35913 Unspecified membranous urethral stricture, male: Secondary | ICD-10-CM | POA: Diagnosis not present

## 2019-02-21 ENCOUNTER — Other Ambulatory Visit: Payer: Self-pay | Admitting: Urology

## 2019-02-26 DIAGNOSIS — N401 Enlarged prostate with lower urinary tract symptoms: Secondary | ICD-10-CM | POA: Diagnosis not present

## 2019-03-01 ENCOUNTER — Other Ambulatory Visit (HOSPITAL_COMMUNITY)
Admission: RE | Admit: 2019-03-01 | Discharge: 2019-03-01 | Disposition: A | Discharge status: Home / Self Care | Payer: Medicare Other | Source: Ambulatory Visit | Attending: Urology | Admitting: Urology

## 2019-03-01 DIAGNOSIS — Z20828 Contact with and (suspected) exposure to other viral communicable diseases: Secondary | ICD-10-CM | POA: Diagnosis not present

## 2019-03-01 DIAGNOSIS — Z01812 Encounter for preprocedural laboratory examination: Secondary | ICD-10-CM | POA: Insufficient documentation

## 2019-03-02 LAB — NOVEL CORONAVIRUS, NAA (HOSP ORDER, SEND-OUT TO REF LAB; TAT 18-24 HRS): SARS-CoV-2, NAA: NOT DETECTED

## 2019-03-04 ENCOUNTER — Encounter (HOSPITAL_BASED_OUTPATIENT_CLINIC_OR_DEPARTMENT_OTHER): Payer: Self-pay

## 2019-03-04 ENCOUNTER — Other Ambulatory Visit: Payer: Self-pay

## 2019-03-04 NOTE — Progress Notes (Signed)
Spoke w/ via phone for pre-op interview--- PT Lab needs dos----  Istat 8            Lab results------  Current ekg in chart/ epic COVID test ------ 03-01-2019 Arrive at ------- 0900 NPO after ------ [place] Medications to take morning of surgery ----- NONE Diabetic medication ----- do not take metformin morning of surgery Patient Special Instructions ----- reviewed [facility] guidelines and visitor restriction policy.  Asked pt to bring home medications in original prescription bottles Pre-Op special Istructions ----- n/a  Patient verbalized understanding of instructions that were given at this phone interview. Patient denies shortness of breath, chest pain, fever, cough a this phone interview.   Anesthesia :  PCP:  Dr Stefanie Libel  Cardiologist :  Dr Claiborne Billings for HTN and UARS (lov 11-13-2018 epic) Chest x-ray : 12-26-2015 epic EKG :  11-13-2018  epic Echo :  01-18-2016  Epic Stress test:  no Cardiac Cath :  no Sleep Study/ CPAP : yes in epic, 2017  Fasting Blood Sugar :   109-140   / Checks Blood Sugar -- times a day:   3 times per weekly Blood Thinner/ Instructions /Last Dose: NO ASA / Instructions/ Last Dose :  ASA 81mg /  Per pt was given instructions from dr winter office to stop 5 days prior /  Last dose 02-28-2019  Patient denies shortness of breath, chest pain, fever, and cough at this phone interview.

## 2019-03-05 ENCOUNTER — Other Ambulatory Visit: Payer: Self-pay

## 2019-03-05 ENCOUNTER — Encounter (HOSPITAL_BASED_OUTPATIENT_CLINIC_OR_DEPARTMENT_OTHER): Payer: Self-pay | Admitting: *Deleted

## 2019-03-05 ENCOUNTER — Ambulatory Visit (HOSPITAL_BASED_OUTPATIENT_CLINIC_OR_DEPARTMENT_OTHER): Payer: Medicare Other | Admitting: Anesthesiology

## 2019-03-05 ENCOUNTER — Observation Stay (HOSPITAL_BASED_OUTPATIENT_CLINIC_OR_DEPARTMENT_OTHER)
Admission: RE | Admit: 2019-03-05 | Discharge: 2019-03-06 | Disposition: A | Discharge status: Home / Self Care | Payer: Medicare Other | Attending: Urology | Admitting: Urology

## 2019-03-05 ENCOUNTER — Encounter (HOSPITAL_BASED_OUTPATIENT_CLINIC_OR_DEPARTMENT_OTHER)
Admission: RE | Disposition: A | Discharge status: Home / Self Care | Payer: Self-pay | Source: Home / Self Care | Attending: Urology

## 2019-03-05 DIAGNOSIS — Z79899 Other long term (current) drug therapy: Secondary | ICD-10-CM | POA: Diagnosis not present

## 2019-03-05 DIAGNOSIS — N400 Enlarged prostate without lower urinary tract symptoms: Secondary | ICD-10-CM | POA: Diagnosis not present

## 2019-03-05 DIAGNOSIS — Z7984 Long term (current) use of oral hypoglycemic drugs: Secondary | ICD-10-CM | POA: Diagnosis not present

## 2019-03-05 DIAGNOSIS — N138 Other obstructive and reflux uropathy: Secondary | ICD-10-CM | POA: Insufficient documentation

## 2019-03-05 DIAGNOSIS — E119 Type 2 diabetes mellitus without complications: Secondary | ICD-10-CM | POA: Insufficient documentation

## 2019-03-05 DIAGNOSIS — Z87891 Personal history of nicotine dependence: Secondary | ICD-10-CM | POA: Insufficient documentation

## 2019-03-05 DIAGNOSIS — N35919 Unspecified urethral stricture, male, unspecified site: Secondary | ICD-10-CM | POA: Diagnosis not present

## 2019-03-05 DIAGNOSIS — Z7982 Long term (current) use of aspirin: Secondary | ICD-10-CM | POA: Diagnosis not present

## 2019-03-05 DIAGNOSIS — N35813 Other membranous urethral stricture, male: Secondary | ICD-10-CM | POA: Insufficient documentation

## 2019-03-05 DIAGNOSIS — I10 Essential (primary) hypertension: Secondary | ICD-10-CM | POA: Diagnosis not present

## 2019-03-05 DIAGNOSIS — G4730 Sleep apnea, unspecified: Secondary | ICD-10-CM | POA: Insufficient documentation

## 2019-03-05 DIAGNOSIS — N401 Enlarged prostate with lower urinary tract symptoms: Secondary | ICD-10-CM | POA: Diagnosis not present

## 2019-03-05 DIAGNOSIS — N320 Bladder-neck obstruction: Secondary | ICD-10-CM | POA: Diagnosis not present

## 2019-03-05 HISTORY — PX: TRANSURETHRAL RESECTION OF PROSTATE: SHX73

## 2019-03-05 HISTORY — DX: Personal history of other diseases of the circulatory system: Z86.79

## 2019-03-05 HISTORY — DX: Other sleep disorders: G47.8

## 2019-03-05 HISTORY — DX: Other chronic pain: G89.29

## 2019-03-05 HISTORY — DX: Sciatica, right side: M54.31

## 2019-03-05 HISTORY — DX: Unspecified osteoarthritis, unspecified site: M19.90

## 2019-03-05 HISTORY — DX: Type 2 diabetes mellitus without complications: E11.9

## 2019-03-05 HISTORY — DX: Allergic rhinitis, unspecified: J30.9

## 2019-03-05 HISTORY — DX: Unspecified urethral stricture, male, unspecified site: N35.919

## 2019-03-05 HISTORY — DX: Benign prostatic hyperplasia with lower urinary tract symptoms: N40.1

## 2019-03-05 HISTORY — DX: Hyperlipidemia, unspecified: E78.5

## 2019-03-05 LAB — GLUCOSE, CAPILLARY: Glucose-Capillary: 118 mg/dL — ABNORMAL HIGH (ref 70–99)

## 2019-03-05 LAB — POCT I-STAT, CHEM 8
BUN: 15 mg/dL (ref 8–23)
Calcium, Ion: 1.21 mmol/L (ref 1.15–1.40)
Chloride: 105 mmol/L (ref 98–111)
Creatinine, Ser: 1.2 mg/dL (ref 0.61–1.24)
Glucose, Bld: 135 mg/dL — ABNORMAL HIGH (ref 70–99)
HCT: 41 % (ref 39.0–52.0)
Hemoglobin: 13.9 g/dL (ref 13.0–17.0)
Potassium: 4.7 mmol/L (ref 3.5–5.1)
Sodium: 143 mmol/L (ref 135–145)
TCO2: 25 mmol/L (ref 22–32)

## 2019-03-05 SURGERY — TURP (TRANSURETHRAL RESECTION OF PROSTATE)
Anesthesia: General | Site: Prostate

## 2019-03-05 MED ORDER — LACTATED RINGERS IV SOLN
INTRAVENOUS | Status: DC
  Administered 2019-03-05 (×2): via INTRAVENOUS
  Filled 2019-03-05: qty 1000

## 2019-03-05 MED ORDER — SODIUM CHLORIDE 0.9 % IR SOLN
3000.0000 mL | Status: DC
  Administered 2019-03-06: 01:00:00 3000 mL
  Filled 2019-03-05: qty 3000

## 2019-03-05 MED ORDER — CEFAZOLIN SODIUM-DEXTROSE 2-4 GM/100ML-% IV SOLN
INTRAVENOUS | Status: AC
  Filled 2019-03-05: qty 100

## 2019-03-05 MED ORDER — ACETAMINOPHEN 325 MG PO TABS
650.0000 mg | ORAL_TABLET | ORAL | Status: DC | PRN
  Filled 2019-03-05: qty 2

## 2019-03-05 MED ORDER — BELLADONNA ALKALOIDS-OPIUM 16.2-60 MG RE SUPP
RECTAL | Status: DC | PRN
  Administered 2019-03-05: 1 via RECTAL

## 2019-03-05 MED ORDER — CEPHALEXIN 500 MG PO CAPS
500.0000 mg | ORAL_CAPSULE | Freq: Three times a day (TID) | ORAL | Status: DC
  Administered 2019-03-05 – 2019-03-06 (×3): 500 mg via ORAL
  Filled 2019-03-05 (×4): qty 1

## 2019-03-05 MED ORDER — ONDANSETRON HCL 4 MG/2ML IJ SOLN
INTRAMUSCULAR | Status: AC
  Filled 2019-03-05: qty 2

## 2019-03-05 MED ORDER — GLYCOPYRROLATE PF 0.2 MG/ML IJ SOSY
PREFILLED_SYRINGE | INTRAMUSCULAR | Status: AC
  Filled 2019-03-05: qty 1

## 2019-03-05 MED ORDER — ONDANSETRON HCL 4 MG/2ML IJ SOLN
4.0000 mg | INTRAMUSCULAR | Status: DC | PRN
  Filled 2019-03-05: qty 2

## 2019-03-05 MED ORDER — RAMIPRIL 5 MG PO CAPS
5.0000 mg | ORAL_CAPSULE | Freq: Every day | ORAL | Status: DC
  Administered 2019-03-05: 5 mg via ORAL
  Filled 2019-03-05: qty 1

## 2019-03-05 MED ORDER — BELLADONNA ALKALOIDS-OPIUM 16.2-60 MG RE SUPP
1.0000 | Freq: Four times a day (QID) | RECTAL | Status: DC | PRN
  Filled 2019-03-05: qty 1

## 2019-03-05 MED ORDER — BELLADONNA ALKALOIDS-OPIUM 16.2-60 MG RE SUPP
RECTAL | Status: AC
  Filled 2019-03-05: qty 1

## 2019-03-05 MED ORDER — OXYBUTYNIN CHLORIDE 5 MG PO TABS
5.0000 mg | ORAL_TABLET | Freq: Three times a day (TID) | ORAL | Status: DC | PRN
  Filled 2019-03-05: qty 1

## 2019-03-05 MED ORDER — MEPERIDINE HCL 25 MG/ML IJ SOLN
6.2500 mg | INTRAMUSCULAR | Status: DC | PRN
  Filled 2019-03-05: qty 1

## 2019-03-05 MED ORDER — LIDOCAINE 2% (20 MG/ML) 5 ML SYRINGE
INTRAMUSCULAR | Status: AC
  Filled 2019-03-05: qty 5

## 2019-03-05 MED ORDER — GLYCOPYRROLATE 0.2 MG/ML IJ SOLN
INTRAMUSCULAR | Status: DC | PRN
  Administered 2019-03-05: 0.2 mg via INTRAVENOUS

## 2019-03-05 MED ORDER — PROPOFOL 10 MG/ML IV BOLUS
INTRAVENOUS | Status: DC | PRN
  Administered 2019-03-05: 120 mg via INTRAVENOUS

## 2019-03-05 MED ORDER — DIPHENHYDRAMINE HCL 50 MG/ML IJ SOLN
12.5000 mg | Freq: Four times a day (QID) | INTRAMUSCULAR | Status: DC | PRN
  Filled 2019-03-05: qty 0.25

## 2019-03-05 MED ORDER — METFORMIN HCL 500 MG PO TABS
500.0000 mg | ORAL_TABLET | Freq: Two times a day (BID) | ORAL | Status: DC
  Administered 2019-03-05: 500 mg via ORAL
  Filled 2019-03-05: qty 1

## 2019-03-05 MED ORDER — EPHEDRINE SULFATE-NACL 50-0.9 MG/10ML-% IV SOSY
PREFILLED_SYRINGE | INTRAVENOUS | Status: DC | PRN
  Administered 2019-03-05: 10 mg via INTRAVENOUS

## 2019-03-05 MED ORDER — ACETAMINOPHEN 160 MG/5ML PO SOLN
325.0000 mg | ORAL | Status: DC | PRN
  Filled 2019-03-05: qty 20.3

## 2019-03-05 MED ORDER — DIPHENHYDRAMINE HCL 12.5 MG/5ML PO ELIX
12.5000 mg | ORAL_SOLUTION | Freq: Four times a day (QID) | ORAL | Status: DC | PRN
  Filled 2019-03-05: qty 5

## 2019-03-05 MED ORDER — FENTANYL CITRATE (PF) 100 MCG/2ML IJ SOLN
INTRAMUSCULAR | Status: DC | PRN
  Administered 2019-03-05: 50 ug via INTRAVENOUS

## 2019-03-05 MED ORDER — MENTHOL 3 MG MT LOZG
LOZENGE | OROMUCOSAL | Status: AC
  Filled 2019-03-05: qty 9

## 2019-03-05 MED ORDER — ONDANSETRON HCL 4 MG/2ML IJ SOLN
4.0000 mg | Freq: Once | INTRAMUSCULAR | Status: DC | PRN
  Filled 2019-03-05: qty 2

## 2019-03-05 MED ORDER — EPHEDRINE 5 MG/ML INJ
INTRAVENOUS | Status: AC
  Filled 2019-03-05: qty 10

## 2019-03-05 MED ORDER — ACETAMINOPHEN 325 MG PO TABS
325.0000 mg | ORAL_TABLET | ORAL | Status: DC | PRN
  Administered 2019-03-05: 650 mg via ORAL
  Administered 2019-03-06: 325 mg via ORAL
  Filled 2019-03-05: qty 2

## 2019-03-05 MED ORDER — MORPHINE SULFATE (PF) 2 MG/ML IV SOLN
2.0000 mg | INTRAVENOUS | Status: DC | PRN
  Filled 2019-03-05: qty 2

## 2019-03-05 MED ORDER — HYDROCODONE-ACETAMINOPHEN 5-325 MG PO TABS
1.0000 | ORAL_TABLET | ORAL | Status: DC | PRN
  Filled 2019-03-05: qty 2

## 2019-03-05 MED ORDER — FENTANYL CITRATE (PF) 100 MCG/2ML IJ SOLN
INTRAMUSCULAR | Status: AC
  Filled 2019-03-05: qty 2

## 2019-03-05 MED ORDER — OXYCODONE HCL 5 MG/5ML PO SOLN
5.0000 mg | Freq: Once | ORAL | Status: DC | PRN
  Filled 2019-03-05: qty 5

## 2019-03-05 MED ORDER — ONDANSETRON HCL 4 MG/2ML IJ SOLN
INTRAMUSCULAR | Status: DC | PRN
  Administered 2019-03-05: 4 mg via INTRAVENOUS

## 2019-03-05 MED ORDER — SODIUM CHLORIDE 0.9 % IR SOLN
Status: DC | PRN
  Administered 2019-03-05: 9000 mL

## 2019-03-05 MED ORDER — SIMVASTATIN 20 MG PO TABS
20.0000 mg | ORAL_TABLET | Freq: Every evening | ORAL | Status: DC
  Administered 2019-03-05: 20 mg via ORAL
  Filled 2019-03-05: qty 1

## 2019-03-05 MED ORDER — ACETAMINOPHEN 325 MG PO TABS
ORAL_TABLET | ORAL | Status: AC
  Filled 2019-03-05: qty 2

## 2019-03-05 MED ORDER — FENTANYL CITRATE (PF) 100 MCG/2ML IJ SOLN
25.0000 ug | INTRAMUSCULAR | Status: DC | PRN
  Filled 2019-03-05: qty 1

## 2019-03-05 MED ORDER — PROPOFOL 10 MG/ML IV BOLUS
INTRAVENOUS | Status: AC
  Filled 2019-03-05: qty 20

## 2019-03-05 MED ORDER — CEFAZOLIN SODIUM-DEXTROSE 2-4 GM/100ML-% IV SOLN
2.0000 g | Freq: Once | INTRAVENOUS | Status: AC
  Administered 2019-03-05: 2 g via INTRAVENOUS
  Filled 2019-03-05: qty 100

## 2019-03-05 MED ORDER — OXYCODONE HCL 5 MG PO TABS
5.0000 mg | ORAL_TABLET | Freq: Once | ORAL | Status: DC | PRN
  Filled 2019-03-05: qty 1

## 2019-03-05 MED ORDER — LIDOCAINE 2% (20 MG/ML) 5 ML SYRINGE
INTRAMUSCULAR | Status: DC | PRN
  Administered 2019-03-05: 100 mg via INTRAVENOUS

## 2019-03-05 SURGICAL SUPPLY — 22 items
BAG DRAIN URO-CYSTO SKYTR STRL (DRAIN) ×3 IMPLANT
BAG DRN RND TRDRP ANRFLXCHMBR (UROLOGICAL SUPPLIES) ×1
BAG DRN UROCATH (DRAIN) ×1
BAG URINE DRAIN 2000ML AR STRL (UROLOGICAL SUPPLIES) ×3 IMPLANT
CATH FOLEY 3WAY 30CC 22FR (CATHETERS) ×2 IMPLANT
CATH FOLEY 3WAY 30CC 24FR (CATHETERS)
CATH URTH STD 24FR FL 3W 2 (CATHETERS) IMPLANT
GLOVE BIO SURGEON STRL SZ7.5 (GLOVE) ×3 IMPLANT
GOWN STRL REUS W/TWL XL LVL3 (GOWN DISPOSABLE) ×3 IMPLANT
GUIDEWIRE ZIPWRE .038 STRAIGHT (WIRE) ×2 IMPLANT
HOLDER FOLEY CATH W/STRAP (MISCELLANEOUS) ×3 IMPLANT
IV NS IRRIG 3000ML ARTHROMATIC (IV SOLUTION) ×6 IMPLANT
LOOP CUT BIPOLAR 24F LRG (ELECTROSURGICAL) ×3 IMPLANT
MANIFOLD NEPTUNE II (INSTRUMENTS) ×3 IMPLANT
NS IRRIG 500ML POUR BTL (IV SOLUTION) ×2 IMPLANT
PACK CYSTO (CUSTOM PROCEDURE TRAY) ×3 IMPLANT
PIN SAFETY STERILE (MISCELLANEOUS) ×3 IMPLANT
RUBBERBAND STERILE (MISCELLANEOUS) IMPLANT
SYRINGE IRR TOOMEY STRL 70CC (SYRINGE) ×3 IMPLANT
TUBE CONNECTING 12'X1/4 (SUCTIONS) ×1
TUBE CONNECTING 12X1/4 (SUCTIONS) ×2 IMPLANT
TUBING UROLOGY SET (TUBING) ×3 IMPLANT

## 2019-03-05 NOTE — Transfer of Care (Signed)
Immediate Anesthesia Transfer of Care Note  Patient: [name]  Procedure(s) Performed: TRANSURETHRAL RESECTION OF THE PROSTATE (TURP), BIPOLAR/ URETHRAL DILATION (N/A Prostate)  Patient Location: PACU  Anesthesia Type:General  Level of Consciousness: awake, alert  and oriented  Airway & Oxygen Therapy: Patient Spontanous Breathing and Patient connected to nasal cannula oxygen  Post-op Assessment: Report given to RN and Post -op Vital signs reviewed and stable  Post vital signs: Reviewed and stable  Last Vitals:  Vitals Value Taken Time  BP 156/77 03/05/19 1212  Temp    Pulse 70 03/05/19 1215  Resp 14 03/05/19 1215  SpO2 99 % 03/05/19 1215  Vitals shown include unvalidated device data.  Last Pain:  Vitals:   03/05/19 0904  TempSrc: Oral         Complications: No apparent anesthesia complications

## 2019-03-05 NOTE — H&P (Signed)
PRE-OP H&P  Enlarged prostate   HPI: [name] is an 85 year old male with a history of BPH (s/p TURP in 2008).   Last PSA- 1.47 (2008)   01/09/19: He presents today with a 3-4 year history of progressively worsening LUTS. He was started on tamsulosin 2 years ago with marginal improvement in his LUTS. Denies interval UTIs, dysuria or hematuria, but states that his urine will appear cloudy and malodorous on occasional.   AUASS- 30/6 (terrible)   02/18/2019: The patient is here today for a flexible cystoscopy to assess for the source of his ongoing urinary tract symptoms. The patient reports little, if any improvement in his urinary symptoms since starting tamsulosin twice daily. He continues to have a weak force of stream, urgency, frequency and the sensation of incomplete bladder emptying.      ALLERGIES: No Allergies    MEDICATIONS: Metformin Hcl 500 mg tablet  Simvastatin 20 mg tablet  Tamsulosin Hcl 0.4 mg capsule  Aspirin Low Dose 81 MG TABS Oral  Ramipril 5 mg capsule     GU PSH: Cystoscopy TURP - 2008       PSH Notes: Transurethral Resection Of Prostate (TURP),    NON-GU PSH: Appendectomy Hernia Repair Knee replacement, Bilateral Lumbar Spine Fusion     GU PMH: BPH w/LUTS - 01/09/2019 Nocturia - 01/09/2019 Urinary Urgency - 01/09/2019 Weak Urinary Stream - 01/09/2019      PMH Notes:  1898-04-24 00:00:00 - Note: Normal Routine History And Physical Senior Citizen [phone])  2006-07-12 10:16:08 - Note: Arthritis   NON-GU PMH: Arthritis, Arthritis - 2014 Personal history of other diseases of the nervous system and sense organs, History of sleep apnea - 2014 Sleep Apnea, Sleep apnea - 2014 Diabetes Type 2 GERD Hypercholesterolemia Hypertension    FAMILY HISTORY: Family Health Status Number - Runs In Family Gastric Cancer - Runs In Family leukemia - Father   SOCIAL HISTORY: Marital Status: Married Preferred Language: English; Race: White Current Smoking Status:  Patient does not smoke anymore.   Tobacco Use Assessment Completed: Used Tobacco in last 30 days? Does not use smokeless tobacco. Has never drank.  Drinks 4+ caffeinated drinks per day. Has not had a blood transfusion.     Notes: Tobacco Use, Marital History - Currently Married, Alcohol Use, Caffeine Use, Death In The Family Father, Occupation:, Death In The Family Mother   REVIEW OF SYSTEMS:    GU Review Male:   Patient reports frequent urination, hard to postpone urination, burning/ pain with urination, and leakage of urine. Patient denies get up at night to urinate, stream starts and stops, trouble starting your stream, have to strain to urinate , erection problems, and penile pain.  Gastrointestinal (Upper):   Patient denies nausea, vomiting, and indigestion/ heartburn.  Gastrointestinal (Lower):   Patient reports diarrhea. Patient denies constipation.  Constitutional:   Patient denies fever, night sweats, weight loss, and fatigue.  Skin:   Patient denies skin rash/ lesion and itching.  Eyes:   Patient denies blurred vision and double vision.  Ears/ Nose/ Throat:   Patient denies sore throat and sinus problems.  Hematologic/Lymphatic:   Patient denies swollen glands and easy bruising.  Cardiovascular:   Patient denies leg swelling and chest pains.  Respiratory:   Patient denies cough and shortness of breath.  Endocrine:   Patient denies excessive thirst.  Musculoskeletal:   Patient denies back pain and joint pain.  Neurological:   Patient denies headaches and dizziness.  Psychologic:   Patient  denies depression and anxiety.   VITAL SIGNS:      02/18/2019 02:20 PM  Weight 204 lb / 92.53 kg  Height 71 in / 180.34 cm  BP 146/74 mmHg  Heart Rate 66 /min  Temperature 97.6 F / 36.4 C  BMI 28.4 kg/m   GU PHYSICAL EXAMINATION:    Urethral Meatus: Normal size. No lesion, no wart, no discharge, no polyp. Normal location.  Penis: Circumcised, no warts, no cracks. No dorsal Peyronie's  plaques, no left corporal Peyronie's plaques, no right corporal Peyronie's plaques, no scarring, no warts. No balanitis, no meatal stenosis.   MULTI-SYSTEM PHYSICAL EXAMINATION:    Constitutional: Well-nourished. No physical deformities. Normally developed. Good grooming.  Neck: Neck symmetrical, not swollen. Normal tracheal position.  Respiratory: No labored breathing, no use of accessory muscles.   Skin: No paleness, no jaundice, no cyanosis. No lesion, no ulcer, no rash.  Neurologic / Psychiatric: Oriented to time, oriented to place, oriented to person. No depression, no anxiety, no agitation.  Gastrointestinal: No mass, no tenderness, no rigidity, non obese abdomen.  Musculoskeletal: Normal gait and station of head and neck.     PAST DATA REVIEWED:  Source Of History:  Patient   07/10/06 07/06/05 07/19/04 07/09/03  PSA  Total PSA 1.47  1.15  1.46  0.95     07/10/06 07/06/05  Hormones  Testosterone, Total 3.91  3.16     PROCEDURES:         Flexible Cystoscopy - 52000  Risks, benefits, and some of the potential complications of the procedure were discussed at length with the patient including infection, bleeding, voiding discomfort, urinary retention, fever, chills, sepsis, and others. All questions were answered. Informed consent was obtained. Antibiotic prophylaxis was given. Sterile technique and intraurethral analgesia were used.  Meatus:  Normal size. Normal location. Normal condition.  Urethra:  Thin membranous urethral stricture that was dilated and bypassed with the 16 French flexible scope  External Sphincter:  Normal.  Verumontanum:  Normal.  Prostate:  Bilobar prostatic regrowth with obstruction  Bladder Neck:  Non-obstructing.  Ureteral Orifices:  Normal location. Normal size. Normal shape. Effluxed clear urine.  Bladder:  Moderate trabeculation. Several diverticulae. No tumors. Normal mucosa. No stones.      The lower urinary tract was carefully examined. The  procedure was well-tolerated and without complications. Antibiotic instructions were given. Instructions were given to call the office immediately for bloody urine, difficulty urinating, urinary retention, painful or frequent urination, fever, chills, nausea, vomiting or other illness. The patient stated that he understood these instructions and would comply with them.         Urinalysis w/Scope Dipstick Dipstick Cont'd Micro  Color: Amber Bilirubin: Neg mg/dL WBC/hpf: 6 - 10/hpf  Appearance: Clear Ketones: Neg mg/dL RBC/hpf: 0 - 2/hpf  Specific Gravity: 1.025 Blood: Neg ery/uL Bacteria: Few (10-25/hpf)  pH: <=5.0 Protein: 1+ mg/dL Cystals: NS (Not Seen)  Glucose: Neg mg/dL Urobilinogen: 0.2 mg/dL Casts: NS (Not Seen)    Nitrites: Neg Trichomonas: Not Present    Leukocyte Esterase: Neg leu/uL Mucous: Present      Epithelial Cells: 0 - 5/hpf      Yeast: NS (Not Seen)      Sperm: Not Present    ASSESSMENT:      ICD-10 Details  1 GU:   BPH w/LUTS - N40.1   2   Weak Urinary Stream - R39.12   3 NON-GU:   Unspecified membranous urethral stricture, male - N35.913    PLAN:  Medications New Meds: Keflex 500 mg capsule 1 capsule PO BID   #6  0 Refill(s)            Schedule Return Visit/Planned Activity: ASAP - Schedule Surgery          Document Letter(s):  Created for Ravisanker Avva, MD   Created for Patient: Clinical Summary         Notes:   -Flexible cystoscopy revealed a thin membranous urethral stricture that was dilated and bypassed with the flexible cystoscope. He was also noted to have a bilobar prostatic regrowth and obstruction along with moderate to severe detrusor trabeculation and several small bladder diverticula.  -The risks, benefits and alternatives of cystoscopy with TURP with balloon dilation of his urethral stricture was discussed with the patient. The risks included, but are not limited to, bleeding, urinary tract infection, bladder perforation requiring  prolonged catheterization and/or open bladder repair, ureteral injury, ureteral obstruction, urethral stricture disease, new or worsening voiding dysfunction, retrograde ejaculation, MI, CVA, PE, DVT and the inherent risks of general anesthesia. We also discussed the need for Foley catheterization for at least 3 days post-op and the likely need for post-op observation in the hospital following the procedure. The patient voices understanding and wishes to proceed.   -Continue tamsulosin BID

## 2019-03-05 NOTE — Anesthesia Preprocedure Evaluation (Signed)
Anesthesia Evaluation  Patient identified by MRN, date of birth, ID band Patient awake  General Assessment Comment:Complication of anesthesia  delirium with infection post op back surgery 04-01-2008    Reviewed: Allergy & Precautions, H&P , NPO status , Patient's Chart, lab work & pertinent test results  History of Anesthesia Complications (+) history of anesthetic complications  Airway Mallampati: I  TM Distance: >3 FB Neck ROM: Full    Dental  (+) Teeth Intact, Dental Advisory Given   Pulmonary sleep apnea , former smoker,    breath sounds clear to auscultation       Cardiovascular hypertension, Pt. on medications  Rhythm:Regular Rate:Normal     Neuro/Psych Anxiety  Neuromuscular disease    GI/Hepatic Neg liver ROS, hiatal hernia,   Endo/Other  diabetes, Well Controlled, Type 2, Oral Hypoglycemic Agents  Renal/GU negative Renal ROS  negative genitourinary   Musculoskeletal  (+) Arthritis , Osteoarthritis,    Abdominal   Peds negative pediatric ROS (+)  Hematology negative hematology ROS (+)   Anesthesia Other Findings   Reproductive/Obstetrics negative OB ROS                             Anesthesia Physical  Anesthesia Plan  ASA: III  Anesthesia Plan: General   Post-op Pain Management:    Induction: Intravenous  PONV Risk Score and Plan: 2 and Ondansetron and Treatment may vary due to age or medical condition  Airway Management Planned: LMA and Oral ETT  Additional Equipment:   Intra-op Plan:   Post-operative Plan: Extubation in OR  Informed Consent: I have reviewed the patients History and Physical, chart, labs and discussed the procedure including the risks, benefits and alternatives for the proposed anesthesia with the patient or authorized representative who has indicated his/her understanding and acceptance.     Dental advisory given  Plan Discussed with: CRNA,  Anesthesiologist and Surgeon  Anesthesia Plan Comments:         Anesthesia Quick Evaluation

## 2019-03-05 NOTE — Anesthesia Procedure Notes (Signed)
Procedure Name: LMA Insertion Date/Time: 03/05/2019 11:19 AM Performed by: Gwyndolyn Saxon, CRNA Pre-anesthesia Checklist: Patient identified, Emergency Drugs available, Suction available and Patient being monitored Patient Re-evaluated:Patient Re-evaluated prior to induction Oxygen Delivery Method: Circle System Utilized Preoxygenation: Pre-oxygenation with 100% oxygen Induction Type: IV induction Ventilation: Mask ventilation without difficulty LMA: LMA with gastric port inserted LMA Size: 4.0 and 5.0 Number of attempts: 1 Airway Equipment and Method: Bite block Placement Confirmation: positive ETCO2 Tube secured with: Tape Dental Injury: Teeth and Oropharynx as per pre-operative assessment

## 2019-03-05 NOTE — Op Note (Signed)
Operative Note  Preoperative diagnosis:  1.  BPH with bladder outlet obstruction 2.  Membranous urethral stricture  Postoperative diagnosis: 1.  BPH with bladder outlet obstruction 2.  Membranous urethral stricture  Procedure(s): 1.  Cystoscopy with Bipolar TURP 2.  Urethral dilation  Surgeon: Ellison Hughs, MD  Assistants:  None  Anesthesia:  General  Complications:  None  EBL: 10 mL  Specimens: 1. Prostate chips  Drains/Catheters: 1.  109 French three-way Foley catheter with 20 mL in the balloon  Intraoperative findings:   1. 2 mm thick membranous urethral stricture with a 16 French aperture 2. Bilobar prostatic urethral obstruction 3. Mild to moderate bladder trabeculation  Indication:  [name] is a 85 y.o. male with history of BPH with worsening urinary tract symptoms.  He recently underwent flexible cystoscopy in the office and was found to have a thin membranous urethral stricture along with bilobar prostatic urethral obstruction.  He has been consented for the above procedures, voices understanding and wishes to proceed.  Description of procedure:  After informed consent was obtained, the patient was brought to the operating room and general anesthesia was administered. The patient was then placed in the dorsolithotomy position and prepped and draped in usual sterile fashion. A timeout was performed. A 23 French rigid cystoscope was then inserted into the urethral meatus and advanced into membranous urethra.  His 2 mm stricture was identified.  A Glidewire was then advanced beyond the level of his stricture and into the bladder.  The rigid scope was then removed and [employer] sounds were then used to dilate the stricture over the wire, starting at 47 [place] and progressing up to 26 [place] (in 2 [place] increments).  The rigid scope was then reinserted into the urethra and advanced into the bladder.  A complete bladder survey revealed no intravesical  pathology.  The rigid cystoscope was then exchanged for a 26 French resectoscope with a bipolar loop working element.  Starting at the bladder neck and progressing distally to the verumontanum, the prostatic adenoma was systematically resected until a widely patent prostatic urethral channel was created.  All prostate chips were then hand irrigated out of the bladder and sent to pathology for permanent section.  The resectoscope was then removed and exchanged for a 22 French three-way Foley catheter.  The three-way Foley catheter was then extensively hand irrigated until the irrigant returned clear to light pink.  The catheter was then placed to continuous bladder irrigation and placed on rubber band traction.  He tolerated the procedure well and was transferred to the postanesthesia unit in stable condition.  Plan:  CBI overnight

## 2019-03-05 NOTE — Anesthesia Postprocedure Evaluation (Signed)
Anesthesia Post Note  Patient: KYTON BIA  Procedure(s) Performed: TRANSURETHRAL RESECTION OF THE PROSTATE (TURP), BIPOLAR/ URETHRAL DILATION (N/A Prostate)     Patient location during evaluation: PACU Anesthesia Type: General Level of consciousness: awake and alert Pain management: pain level controlled Vital Signs Assessment: post-procedure vital signs reviewed and stable Respiratory status: spontaneous breathing, nonlabored ventilation, respiratory function stable and patient connected to nasal cannula oxygen Cardiovascular status: blood pressure returned to baseline and stable Postop Assessment: no apparent nausea or vomiting Anesthetic complications: no    Last Vitals:  Vitals:   03/05/19 1354 03/05/19 1507  BP: (!) 167/83 (!) 167/76  Pulse: (!) 51 (!) 54  Resp: 16 16  Temp: (!) 36.2 C (!) 36.3 C  SpO2: 97% 96%    Last Pain:  Vitals:   03/05/19 1507  TempSrc: Oral  PainSc:                  Lolitha Tortora

## 2019-03-06 ENCOUNTER — Encounter (HOSPITAL_BASED_OUTPATIENT_CLINIC_OR_DEPARTMENT_OTHER): Payer: Self-pay | Admitting: Urology

## 2019-03-06 DIAGNOSIS — G4730 Sleep apnea, unspecified: Secondary | ICD-10-CM | POA: Diagnosis not present

## 2019-03-06 DIAGNOSIS — Z79899 Other long term (current) drug therapy: Secondary | ICD-10-CM | POA: Diagnosis not present

## 2019-03-06 DIAGNOSIS — E119 Type 2 diabetes mellitus without complications: Secondary | ICD-10-CM | POA: Diagnosis not present

## 2019-03-06 DIAGNOSIS — N401 Enlarged prostate with lower urinary tract symptoms: Secondary | ICD-10-CM | POA: Diagnosis not present

## 2019-03-06 DIAGNOSIS — N35813 Other membranous urethral stricture, male: Secondary | ICD-10-CM | POA: Diagnosis not present

## 2019-03-06 DIAGNOSIS — I10 Essential (primary) hypertension: Secondary | ICD-10-CM | POA: Diagnosis not present

## 2019-03-06 DIAGNOSIS — Z7984 Long term (current) use of oral hypoglycemic drugs: Secondary | ICD-10-CM | POA: Diagnosis not present

## 2019-03-06 DIAGNOSIS — Z7982 Long term (current) use of aspirin: Secondary | ICD-10-CM | POA: Diagnosis not present

## 2019-03-06 DIAGNOSIS — N138 Other obstructive and reflux uropathy: Secondary | ICD-10-CM | POA: Diagnosis not present

## 2019-03-06 DIAGNOSIS — Z87891 Personal history of nicotine dependence: Secondary | ICD-10-CM | POA: Diagnosis not present

## 2019-03-06 LAB — SURGICAL PATHOLOGY

## 2019-03-06 MED ORDER — OXYBUTYNIN CHLORIDE 5 MG PO TABS: 5.0000 mg | ORAL_TABLET | Freq: Three times a day (TID) | ORAL | 1 refills | Status: DC | PRN

## 2019-03-06 MED ORDER — PHENAZOPYRIDINE HCL 200 MG PO TABS: 200.0000 mg | ORAL_TABLET | Freq: Three times a day (TID) | ORAL | 0 refills | Status: DC | PRN

## 2019-03-06 MED ORDER — TRAMADOL HCL 50 MG PO TABS: 50.0000 mg | ORAL_TABLET | Freq: Four times a day (QID) | ORAL | 0 refills | Status: AC | PRN

## 2019-03-06 MED ORDER — CEPHALEXIN 500 MG PO CAPS: 500.0000 mg | ORAL_CAPSULE | Freq: Two times a day (BID) | ORAL | 0 refills | Status: AC

## 2019-03-06 MED ORDER — ACETAMINOPHEN 325 MG PO TABS
ORAL_TABLET | ORAL | Status: AC
  Filled 2019-03-06: qty 2

## 2019-03-06 NOTE — Discharge Summary (Signed)
Date of admission: 03/05/2019  Date of discharge: 03/06/2019  Admission diagnosis: BPH w/ LUTS, membranous urethral stricture  Discharge diagnosis: Same  Procedures:  Bipolar TURP and urethral dilation  History and Physical: For full details, please see admission history and physical. Briefly, [name] is a 85 y.o. year old patient with BPH w/ LUTS and a membranous urethral stricture.  He underwent the above procedures on 03/05/19.   Hospital Course: Routine post-op course following TURP  Physical Exam:  General: Alert and oriented CV: RRR, palpable distal pulses Lungs: CTAB, equal chest rise Abdomen: Soft, NTND, no rebound or guarding GU:  22 F 3-way Foley in place and draining clear urine (off CBI) Ext: NT, No erythema  Laboratory values:  Recent Labs    03/05/19 0922  HGB 13.9  HCT 41.0   Recent Labs    03/05/19 0922  CREATININE 1.20    Disposition: Home  Discharge instruction: The patient was instructed to be ambulatory but told to refrain from heavy lifting, strenuous activity, or driving.  Discharge medications:  Allergies as of 03/06/2019   No Known Allergies     Medication List    TAKE these medications   aspirin EC 81 MG tablet Take 81 mg by mouth daily.   cephALEXin 500 MG capsule Commonly known as: KEFLEX Take 1 capsule (500 mg total) by mouth 2 (two) times daily for 3 days.   cetirizine 10 MG tablet Commonly known as: ZYRTEC Take 10 mg by mouth at bedtime.   metFORMIN 500 MG tablet Commonly known as: GLUCOPHAGE Take 500 mg by mouth 2 (two) times daily.   OVER THE COUNTER MEDICATION Place 1 spray into both nostrils daily as needed (for congestion). [employer] Nasal Spray   oxybutynin 5 MG tablet Commonly known as: DITROPAN Take 1 tablet (5 mg total) by mouth every 8 (eight) hours as needed for bladder spasms.   phenazopyridine 200 MG tablet Commonly known as: Pyridium Take 1 tablet (200 mg total) by mouth 3 (three) times daily as needed  (for pain with urination).   ramipril 5 MG capsule Commonly known as: ALTACE Take 5 mg by mouth at bedtime.   simvastatin 20 MG tablet Commonly known as: ZOCOR Take 20 mg by mouth every evening.   traMADol 50 MG tablet Commonly known as: Ultram Take 1 tablet (50 mg total) by mouth every 6 (six) hours as needed for up to 3 days.       Followup:  Follow-up Information    [facility] On 03/10/2019.   Why: Follow-up at 9:00 AM for catheter removal Contact information: [place] [phone]

## 2019-03-06 NOTE — Discharge Instructions (Signed)
Indwelling Urinary Catheter Care, Adult °An indwelling urinary catheter is a thin tube that is put into your bladder. The tube helps to drain pee (urine) out of your body. The tube goes in through your urethra. Your urethra is where pee comes out of your body. Your pee will come out through the catheter, then it will go into a bag (drainage bag). °Take good care of your catheter so it will work well. °How to wear your catheter and bag °Supplies needed °· Sticky tape (adhesive tape) or a leg strap. °· Alcohol wipe or soap and water (if you use tape). °· A clean towel (if you use tape). °· Large overnight bag. °· Smaller bag (leg bag). °Wearing your catheter °Attach your catheter to your leg with tape or a leg strap. °· Make sure the catheter is not pulled tight. °· If a leg strap gets wet, take it off and put on a dry strap. °· If you use tape to hold the bag on your leg: °1. Use an alcohol wipe or soap and water to wash your skin where the tape made it sticky before. °2. Use a clean towel to pat-dry that skin. °3. Use new tape to make the bag stay on your leg. °Wearing your bags °You should have been given a large overnight bag. °· You may wear the overnight bag in the day or night. °· Always have the overnight bag lower than your bladder.  Do not let the bag touch the floor. °· Before you go to sleep, put a clean plastic bag in a wastebasket. Then hang the overnight bag inside the wastebasket. °You should also have a smaller leg bag that fits under your clothes. °· Always wear the leg bag below your knee. °· Do not wear your leg bag at night. °How to care for your skin and catheter °Supplies needed °· A clean washcloth. °· Water and mild soap. °· A clean towel. °Caring for your skin and catheter ° °  ° °· Clean the skin around your catheter every day: °? Wash your hands with soap and water. °? Wet a clean washcloth in warm water and mild soap. °? Clean the skin around your urethra. °? If you are male: °? Gently  spread the folds of skin around your vagina (labia). °? With the washcloth in your other hand, wipe the inner side of your labia on each side. Wipe from front to back. °? If you are male: °? Pull back any skin that covers the end of your penis (foreskin). °? With the washcloth in your other hand, wipe your penis in small circles. Start wiping at the tip of your penis, then move away from the catheter. °? Move the foreskin back in place, if needed. °? With your free hand, hold the catheter close to where it goes into your body. °? Keep holding the catheter during cleaning so it does not get pulled out. °? With the washcloth in your other hand, clean the catheter. °? Only wipe downward on the catheter. °? Do not wipe upward toward your body. Doing this may push germs into your urethra and cause infection. °? Use a clean towel to pat-dry the catheter and the skin around it. Make sure to wipe off all soap. °? Wash your hands with soap and water. °· Shower every day. Do not take baths. °· Do not use cream, ointment, or lotion on the area where the catheter goes into your body, unless your doctor tells you   to. °· Do not use powders, sprays, or lotions on your genital area. °· Check your skin around the catheter every day for signs of infection. Check for: °? Redness, swelling, or pain. °? Fluid or blood. °? Warmth. °? Pus or a bad smell. °How to empty the bag °Supplies needed °· Rubbing alcohol. °· Gauze pad or cotton ball. °· Tape or a leg strap. °Emptying the bag °Pour the pee out of your bag when it is ?-½ full, or at least 2-3 times a day. Do this for your overnight bag and your leg bag. °1. Wash your hands with soap and water. °2. Separate (detach) the bag from your leg. °3. Hold the bag over the toilet or a clean pail. Keep the bag lower than your hips and bladder. This is so the pee (urine) does not go back into the tube. °4. Open the pour spout. It is at the bottom of the bag. °5. Empty the pee into the toilet or  pail. Do not let the pour spout touch any surface. °6. Put rubbing alcohol on a gauze pad or cotton ball. °7. Use the gauze pad or cotton ball to clean the pour spout. °8. Close the pour spout. °9. Attach the bag to your leg with tape or a leg strap. °10. Wash your hands with soap and water. °Follow instructions for cleaning the drainage bag: °· From the product maker. °· As told by your doctor. °How to change the bag °Supplies needed °· Alcohol wipes. °· A clean bag. °· Tape or a leg strap. °Changing the bag °Replace your bag when it starts to leak, smell bad, or look dirty. °1. Wash your hands with soap and water. °2. Separate the dirty bag from your leg. °3. Pinch the catheter with your fingers so that pee does not spill out. °4. Separate the catheter tube from the bag tube where these tubes connect (at the connection valve). Do not let the tubes touch any surface. °5. Clean the end of the catheter tube with an alcohol wipe. Use a different alcohol wipe to clean the end of the bag tube. °6. Connect the catheter tube to the tube of the clean bag. °7. Attach the clean bag to your leg with tape or a leg strap. Do not make the bag tight on your leg. °8. Wash your hands with soap and water. °General rules ° °· Never pull on your catheter. Never try to take it out. Doing that can hurt you. °· Always wash your hands before and after you touch your catheter or bag. Use a mild, fragrance-free soap. If you do not have soap and water, use hand sanitizer. °· Always make sure there are no twists or bends (kinks) in the catheter tube. °· Always make sure there are no leaks in the catheter or bag. °· Drink enough fluid to keep your pee pale yellow. °· Do not take baths, swim, or use a hot tub. °· If you are male, wipe from front to back after you poop (have a bowel movement). °Contact a doctor if: °· Your pee is cloudy. °· Your pee smells worse than usual. °· Your catheter gets clogged. °· Your catheter leaks. °· Your bladder  feels full. °Get help right away if: °· You have redness, swelling, or pain where the catheter goes into your body. °· You have fluid, blood, pus, or a bad smell coming from the area where the catheter goes into your body. °· Your skin feels warm where   the catheter goes into your body. °· You have a fever. °· You have pain in your: °? Belly (abdomen). °? Legs. °? Lower back. °? Bladder. °· You see blood in the catheter. °· Your pee is pink or red. °· You feel sick to your stomach (nauseous). °· You throw up (vomit). °· You have chills. °· Your pee is not draining into the bag. °· Your catheter gets pulled out. °Summary °· An indwelling urinary catheter is a thin tube that is placed into the bladder to help drain pee (urine) out of the body. °· The catheter is placed into the part of the body that drains pee from the bladder (urethra). °· Taking good care of your catheter will keep it working properly and help prevent problems. °· Always wash your hands before and after touching your catheter or bag. °· Never pull on your catheter or try to take it out. °This information is not intended to replace advice given to you by your health care provider. Make sure you discuss any questions you have with your health care provider. °Document Released: 08/05/2012 Document Revised: 08/02/2018 Document Reviewed: 11/24/2016 °Elsevier Patient Education © 2020 [employer] ° °

## 2019-03-31 DIAGNOSIS — Z125 Encounter for screening for malignant neoplasm of prostate: Secondary | ICD-10-CM | POA: Diagnosis not present

## 2019-03-31 DIAGNOSIS — E7849 Other hyperlipidemia: Secondary | ICD-10-CM | POA: Diagnosis not present

## 2019-03-31 DIAGNOSIS — E1169 Type 2 diabetes mellitus with other specified complication: Secondary | ICD-10-CM | POA: Diagnosis not present

## 2019-04-07 DIAGNOSIS — Z0000 Encounter for general adult medical examination without abnormal findings: Secondary | ICD-10-CM | POA: Diagnosis not present

## 2019-04-07 DIAGNOSIS — E1169 Type 2 diabetes mellitus with other specified complication: Secondary | ICD-10-CM | POA: Diagnosis not present

## 2019-04-16 DIAGNOSIS — Z1212 Encounter for screening for malignant neoplasm of rectum: Secondary | ICD-10-CM | POA: Diagnosis not present

## 2019-04-22 DIAGNOSIS — N401 Enlarged prostate with lower urinary tract symptoms: Secondary | ICD-10-CM | POA: Diagnosis not present

## 2019-04-22 DIAGNOSIS — R3912 Poor urinary stream: Secondary | ICD-10-CM | POA: Diagnosis not present

## 2019-04-22 DIAGNOSIS — R3915 Urgency of urination: Secondary | ICD-10-CM | POA: Diagnosis not present

## 2019-04-22 DIAGNOSIS — R351 Nocturia: Secondary | ICD-10-CM | POA: Diagnosis not present

## 2019-06-18 DIAGNOSIS — Z23 Encounter for immunization: Secondary | ICD-10-CM | POA: Diagnosis not present

## 2019-07-16 DIAGNOSIS — Z23 Encounter for immunization: Secondary | ICD-10-CM | POA: Diagnosis not present

## 2019-08-19 DIAGNOSIS — M25519 Pain in unspecified shoulder: Secondary | ICD-10-CM | POA: Diagnosis not present

## 2019-08-19 DIAGNOSIS — Z1331 Encounter for screening for depression: Secondary | ICD-10-CM | POA: Diagnosis not present

## 2019-08-19 DIAGNOSIS — E1169 Type 2 diabetes mellitus with other specified complication: Secondary | ICD-10-CM | POA: Diagnosis not present

## 2019-08-19 DIAGNOSIS — I10 Essential (primary) hypertension: Secondary | ICD-10-CM | POA: Diagnosis not present

## 2019-08-19 DIAGNOSIS — F439 Reaction to severe stress, unspecified: Secondary | ICD-10-CM | POA: Diagnosis not present

## 2019-08-19 DIAGNOSIS — N400 Enlarged prostate without lower urinary tract symptoms: Secondary | ICD-10-CM | POA: Diagnosis not present

## 2019-08-19 DIAGNOSIS — E785 Hyperlipidemia, unspecified: Secondary | ICD-10-CM | POA: Diagnosis not present

## 2019-08-20 DIAGNOSIS — D485 Neoplasm of uncertain behavior of skin: Secondary | ICD-10-CM | POA: Diagnosis not present

## 2019-08-20 DIAGNOSIS — D1801 Hemangioma of skin and subcutaneous tissue: Secondary | ICD-10-CM | POA: Diagnosis not present

## 2019-08-20 DIAGNOSIS — D692 Other nonthrombocytopenic purpura: Secondary | ICD-10-CM | POA: Diagnosis not present

## 2019-08-20 DIAGNOSIS — L570 Actinic keratosis: Secondary | ICD-10-CM | POA: Diagnosis not present

## 2019-08-20 DIAGNOSIS — Z85828 Personal history of other malignant neoplasm of skin: Secondary | ICD-10-CM | POA: Diagnosis not present

## 2019-08-20 DIAGNOSIS — L820 Inflamed seborrheic keratosis: Secondary | ICD-10-CM | POA: Diagnosis not present

## 2019-08-20 DIAGNOSIS — L821 Other seborrheic keratosis: Secondary | ICD-10-CM | POA: Diagnosis not present

## 2019-09-03 ENCOUNTER — Other Ambulatory Visit (HOSPITAL_COMMUNITY): Payer: Self-pay | Admitting: Internal Medicine

## 2019-09-03 DIAGNOSIS — I129 Hypertensive chronic kidney disease with stage 1 through stage 4 chronic kidney disease, or unspecified chronic kidney disease: Secondary | ICD-10-CM | POA: Diagnosis not present

## 2019-09-03 DIAGNOSIS — N1831 Chronic kidney disease, stage 3a: Secondary | ICD-10-CM | POA: Diagnosis not present

## 2019-09-03 DIAGNOSIS — R600 Localized edema: Secondary | ICD-10-CM | POA: Diagnosis not present

## 2019-09-03 DIAGNOSIS — R011 Cardiac murmur, unspecified: Secondary | ICD-10-CM | POA: Diagnosis not present

## 2019-09-03 DIAGNOSIS — M25519 Pain in unspecified shoulder: Secondary | ICD-10-CM | POA: Diagnosis not present

## 2019-09-04 ENCOUNTER — Ambulatory Visit (HOSPITAL_COMMUNITY)
Admission: RE | Admit: 2019-09-04 | Discharge: 2019-09-04 | Disposition: A | Discharge status: Home / Self Care | Payer: Medicare Other | Source: Ambulatory Visit | Attending: Internal Medicine | Admitting: Internal Medicine

## 2019-09-04 ENCOUNTER — Other Ambulatory Visit: Payer: Self-pay

## 2019-09-04 DIAGNOSIS — R600 Localized edema: Secondary | ICD-10-CM | POA: Insufficient documentation

## 2019-09-04 NOTE — Progress Notes (Signed)
Bilateral lower extremity venous duplex has been completed. Preliminary results can be found in CV Proc through chart review.  Results were faxed to Dr. Danna Hefty office.  09/04/19 3:17 PM Carlos Levering RVT

## 2019-09-05 ENCOUNTER — Other Ambulatory Visit: Payer: Self-pay | Admitting: Internal Medicine

## 2019-09-05 DIAGNOSIS — R600 Localized edema: Secondary | ICD-10-CM

## 2019-09-05 DIAGNOSIS — M25512 Pain in left shoulder: Secondary | ICD-10-CM | POA: Diagnosis not present

## 2019-09-05 DIAGNOSIS — R011 Cardiac murmur, unspecified: Secondary | ICD-10-CM

## 2019-09-05 DIAGNOSIS — M542 Cervicalgia: Secondary | ICD-10-CM | POA: Diagnosis not present

## 2019-09-11 ENCOUNTER — Other Ambulatory Visit: Payer: Self-pay

## 2019-09-11 ENCOUNTER — Ambulatory Visit: Payer: Medicare Other

## 2019-09-11 DIAGNOSIS — R011 Cardiac murmur, unspecified: Secondary | ICD-10-CM | POA: Diagnosis not present

## 2019-09-11 DIAGNOSIS — R600 Localized edema: Secondary | ICD-10-CM | POA: Diagnosis not present

## 2019-10-01 DIAGNOSIS — N1831 Chronic kidney disease, stage 3a: Secondary | ICD-10-CM | POA: Diagnosis not present

## 2019-10-01 DIAGNOSIS — I129 Hypertensive chronic kidney disease with stage 1 through stage 4 chronic kidney disease, or unspecified chronic kidney disease: Secondary | ICD-10-CM | POA: Diagnosis not present

## 2019-10-01 DIAGNOSIS — R600 Localized edema: Secondary | ICD-10-CM | POA: Diagnosis not present

## 2019-10-31 DIAGNOSIS — M542 Cervicalgia: Secondary | ICD-10-CM | POA: Diagnosis not present

## 2019-11-21 ENCOUNTER — Ambulatory Visit (INDEPENDENT_AMBULATORY_CARE_PROVIDER_SITE_OTHER): Payer: Medicare Other | Admitting: Cardiovascular Disease

## 2019-11-21 ENCOUNTER — Encounter: Payer: Self-pay | Admitting: Cardiovascular Disease

## 2019-11-21 ENCOUNTER — Other Ambulatory Visit: Payer: Self-pay

## 2019-11-21 VITALS — BP 138/68 | HR 65 | Temp 98.0°F | Ht 71.0 in | Wt 212.0 lb

## 2019-11-21 DIAGNOSIS — E119 Type 2 diabetes mellitus without complications: Secondary | ICD-10-CM | POA: Diagnosis not present

## 2019-11-21 DIAGNOSIS — I10 Essential (primary) hypertension: Secondary | ICD-10-CM

## 2019-11-21 DIAGNOSIS — G478 Other sleep disorders: Secondary | ICD-10-CM | POA: Diagnosis not present

## 2019-11-21 DIAGNOSIS — E785 Hyperlipidemia, unspecified: Secondary | ICD-10-CM

## 2019-11-21 NOTE — Progress Notes (Signed)
Primary MD: Dr Dagmar Hait  PATIENT PROFILE: [name] is a 86 y.o. male who presents for a 12 month follow-up cardiology evaluation.  HPI:  [name] has a long-standing history of diabetes mellitus as well as hypertension.  Early Sunday morning on 12/26/2015 he was awakened from sleep at 12:22 AM feeling dizzy and had transient blurred vision.  He denied associated chest pain, shortness of breath, nausea or vomiting.  He felt that his pulse was slow.  Reportedly, his heart rate was in the upper 30s to low 40s.  He stayed in bed until approximate 5 AM at that time his blood pressure recording was elevated.  He ultimately went to the emergency room.  His heart rate apparently was somewhat labile.  He underwent a CT which did not reveal any acute intracranial findings.  There was mild cerebral atrophy.  A chest x-ray revealed no evidence for acute cardiopulmonary abnormality.  Thoracic spondylosis was present.  In the emergency room, his blood pressure was 146/71, pulse was 45.  2 saturation was 97%.  Laboratory revealed a glucose 130.  He had a normal urinalysis.  Troponin was negative.  Hemoglobin 13.7, hematocrit 42.6.   A14 day event monitor from September 26 through 01/31/2016 showed predominant sinus rhythm with rare interpolated PVCs.  There were no episodes of atrial fibrillation or significant bradycardia or tachydysrhythmia.  Because of very poor sleep he underwent a sleep study on 01/24/2016.  This study was interpreted by me and showed an AHI of 4.9 overall, suggesting at least increased upper airway resistance, but I suspect he does have underlying sleep apnea.  He had very poor sleep efficiency at only 32.5% and he never achieved REM sleep. For this reason, I feel that this study is suboptimal and I suspect he did not meet definitive sleep apnea criteria as result of inadequate sleep duration and absence of REM sleep.  He also was noted have frequent periodic limb movements with an  index of 49.76.  He does admit to painful restless legs, with the urge to move.   An  Echo Doppler study to 07/18/2015 showed an EF of 60-65%.  There was grade 1 diastolic dysfunction,  moderate left atrial dilatation, mild aortic insufficiency.   When I saw him in February 2019 he had remained stable since his prior evaluation in January 2018.  He is sleeping much better than he had previously.  He denies chest pain.  He denies change in exercise tolerance.  He denies palpitations.  He continues to take ramipril for hypertension.  He is on simvastatin 20 mg for hyperlipidemia.  Laboratory by Dr. Dagmar Hait in October 2018 showed an LDL cholesterol at 48.  Hemoglobin A1c was increased at 7.3%.  He is on metformin 500 mg twice a day.    I saw him in February 2019 and his weight was 217 and he remained cardiovascular stable being very active doing yard work and gardening.  He was last seen by me in July 2020 and at that time continue to be on ramipril 5 mg for hypertension and simvastatin 20 mg.  He will be undergoing laboratory this week with his primary physician.  He is diabetic on metformin 500 mg twice a day.  He experiences occasional low back discomfort.  He deniedchest pain PND orthopnea or any recurrent episodes of dizziness.  Continue to be active cutting his grass and doing gardening.   Since I last saw him, he has remained stable  over the past year.  He is followed by Dr. Danne Baxter.  Laboratory in December 2020 showed an LDL cholesterol at 43 on simvastatin 20 mg.  He had recent laboratory in April/May 2021 which showed a creatinine of 1.2.  He is diabetic on metformin 500 mg and hemoglobin A1c in April 2021 was 6.6.  He denies any chest pain or shortness of breath.  He denies palpitations.  He presents for evaluation.  Past Medical History:  Diagnosis Date  . Allergic rhinitis   . Anxiety   . Arthritis   . Benign localized prostatic hyperplasia with lower urinary tract symptoms (LUTS)   . Chronic  back pain    treated with epidural injections  . Complication of anesthesia    delirium with infection post op back surgery 04-01-2008   . H/O hiatal hernia   . History of cardiac arrhythmia    event monitor 01-18-2016 in epic (per pt heart rate drops),  showed SR, SB, rare episodes intercalated PVCs with no evidence high grade ectopy or atrial fib  . Hyperlipidemia   . Hypertension    followed by pcp  . Sciatica of right side   . Type 2 diabetes mellitus ([place])    followed by pcp  (03-04-2019  check's cbg 3 times per week,  fasting cbg 109-140)  . Upper airway resistance syndrome    per cardiologist , dr Claiborne Billings, note in [facility] 11-13-2018  dx from study done 2017 in epic   . Urethral stricture     Past Surgical History:  Procedure Laterality Date  . APPENDECTOMY  age 69  . CATARACT EXTRACTION W/ INTRAOCULAR LENS  IMPLANT, BILATERAL Bilateral 2017; 2018  . CLOSED REDUCTION GREATER HUMERAL TUBEROSITY FRACTURE Left 06-26-2009   _0   . KNEE ARTHROSCOPY Bilateral right 11/ 2007;  left 2012  . LAPAROSCOPIC NISSEN FUNDOPLICATION  [id number]   dr Hassell Done _1   . LUMBAR SPINE SURGERY  x4  last one 04-01-2008 dr Ronnald Ramp _2    this including ORIF L4 compression fracture and fusion L3-5  . NASAL SEPTOPLASTY W/ TURBINOPLASTY  2000  . SHOULDER ARTHROSCOPY Left 07-20-2009   dr Mardelle Matte _3    ORIF of greater tuberosity and debridement  . STRABISMUS SURGERY Left 09/11/2014   Procedure: REPAIR STRABISMUS LEFT EYE;  Surgeon: Everitt Amber, MD;  Location: [place];  Service: Ophthalmology;  Laterality: Left;  . TONSILLECTOMY AND ADENOIDECTOMY  age 10  . TOTAL KNEE ARTHROPLASTY Right 05/13/2007    dr Noemi Chapel _4   . TOTAL KNEE ARTHROPLASTY Left 08/05/2012   Procedure: TOTAL KNEE ARTHROPLASTY;  Surgeon: Lorn Junes, MD;  Location: [place];  Service: Orthopedics;  Laterality: Left;  . TRANSURETHRAL RESECTION OF PROSTATE N/A 03/05/2019   Procedure: TRANSURETHRAL RESECTION OF THE PROSTATE (TURP),  BIPOLAR/ URETHRAL DILATION;  Surgeon: Ceasar Mons, MD;  Location: [facility];  Service: Urology;  Laterality: N/A;    No Known Allergies  Current Outpatient Medications  Medication Sig Dispense Refill  . aspirin EC 81 MG tablet Take 81 mg by mouth daily.    . cetirizine (ZYRTEC) 10 MG tablet Take 10 mg by mouth at bedtime.     . metFORMIN (GLUCOPHAGE) 500 MG tablet Take 500 mg by mouth 2 (two) times daily.     Marland Kitchen OVER THE COUNTER MEDICATION Place 1 spray into both nostrils daily as needed (for congestion). [employer] Nasal Spray    . ramipril (ALTACE) 5 MG capsule Take 5 mg by mouth at bedtime.     Marland Kitchen  simvastatin (ZOCOR) 20 MG tablet Take 20 mg by mouth every evening.      No current facility-administered medications for this visit.    Social History   Socioeconomic History  . Marital status: Married    Spouse name: Not on file  . Number of children: Not on file  . Years of education: Not on file  . Highest education level: Not on file  Occupational History  . Not on file  Tobacco Use  . Smoking status: Former Smoker    Years: 25.00    Types: Cigarettes    Quit date: 04/29/1983    Years since quitting: 36.5  . Smokeless tobacco: Former Systems developer    Types: [place] date: 03/03/1986  Vaping Use  . Vaping Use: Never used  Substance and Sexual Activity  . Alcohol use: No  . Drug use: Never  . Sexual activity: Not on file  Other Topics Concern  . Not on file  Social History Narrative  . Not on file   Social Determinants of Health   Financial Resource Strain:   . Difficulty of Paying Living Expenses:   Food Insecurity:   . Worried About Charity fundraiser in the Last Year:   . Arboriculturist in the Last Year:   Transportation Needs:   . Film/video editor (Medical):   Marland Kitchen Lack of Transportation (Non-Medical):   Physical Activity:   . Days of Exercise per Week:   . Minutes of Exercise per Session:   Stress:   . Feeling of Stress :     Social Connections:   . Frequency of Communication with Friends and Family:   . Frequency of Social Gatherings with Friends and Family:   . Attends Religious Services:   . Active Member of Clubs or Organizations:   . Attends Archivist Meetings:   Marland Kitchen Marital Status:   Intimate Partner Violence:   . Fear of Current or Ex-Partner:   . Emotionally Abused:   Marland Kitchen Physically Abused:   . Sexually Abused:    Socially he is married to my patient, [name] who is the mother of my patient [name] (previously [employer]).  He is married for 23 years after being widowed previously.  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  He remains active.  Family History  Problem Relation Age of Onset  . Heart attack Father   . Stroke Maternal Grandfather   . Cancer Other   . Stroke Mother   . COPD Sister   . Other Brother        accident  . Heart failure Sister   . COPD Brother    Additional family history is notable that his mother died of "old age "at age 25.  His father died at age 44 secondary to leukemia.  His 2 brothers are deceased, one at age 43 with COPD and the other died as a result of an auto accident.  He is 2 deceased sisters, one died at age 77 from COPD and another at age 56 with heart failure.  ROS General: Negative; No fevers, chills, or night sweats HEENT: Negative; No changes in vision or hearing, sinus congestion, difficulty swallowing Pulmonary: Negative; No cough, wheezing, shortness of breath, hemoptysis Cardiovascular:  See HPI;  GI: Negative; No nausea, vomiting, diarrhea, or abdominal pain GU: Negative; No dysuria, hematuria, or difficulty voiding Musculoskeletal: Low back pain discomfort;  bilateral knee surgery 2008 in 2012 Hematologic/Oncologic: Negative; no easy bruising, bleeding Endocrine: Negative; no heat/cold intolerance; no diabetes Neuro: Negative; no changes in balance,  headaches Skin: Negative; No rashes or skin lesions Psychiatric: Negative; No behavioral problems, depression Sleep: Positive for previously poor sleep with frequent awakenings at least 4 times per night, snoring, nonrestorative sleep.  This improved with zolpidem.  No bruxism, restless legs, hypnogagnic hallucinations Other comprehensive 14 point system review is negative   Physical Exam BP (!) 138/68   Pulse 65   Temp 98 F (36.7 C)   Ht _0  (1.803 m)   Wt (!) 212 lb (96.2 kg)   SpO2 95%   BMI 29.57 kg/m    Repeat blood pressure by me was 110/70 supine and 112/70 standing  Wt Readings from Last 3 Encounters:  11/21/19 (!) 212 lb (96.2 kg)  03/05/19 209 lb 9 oz (95.1 kg)  11/13/18 212 lb 9.6 oz (96.4 kg)   General: Alert, oriented, no distress.  Skin: normal turgor, no rashes, warm and dry HEENT: Normocephalic, atraumatic. Pupils equal round and reactive to light; sclera anicteric; extraocular muscles intact;  Nose without nasal septal hypertrophy Mouth/Parynx benign; Mallinpatti scale 3 Neck: No JVD, no carotid bruits; normal carotid upstroke Lungs: clear to ausculatation and percussion; no wheezing or rales Chest wall: without tenderness to palpitation Heart: PMI not displaced, RRR, s1 s2 normal, 1/6 systolic murmur, no diastolic murmur, no rubs, gallops, thrills, or heaves Abdomen: soft, nontender; no hepatosplenomehaly, BS+; abdominal aorta nontender and not dilated by palpation. Back: no CVA tenderness Pulses 2+ Musculoskeletal: full range of motion, normal strength, no joint deformities Extremities: no clubbing cyanosis or edema, Homan's sign negative  Neurologic: grossly nonfocal; Cranial nerves grossly wnl Psychologic: Normal mood and affect   ECG (independently read by me): Normal sinus rhythm at 65 bpm.  First-degree block with a PR interval of 266 ms.  No ectopy.  July 2020 ECG (independently read by me): Sinus bradycardia at 58 bpm.  First-degree AV block  with a PR interval 222 ms.  February 2019 ECG (independently read by me): Sinus bradycardia 55 bpm.  First-degree AV block with a PR interval of 228 ms.  January 2018 ECG (independently read by me): Normal sinus rhythm at 75 bpm.  First-degree AV block with a PR interval at 214 ms.  Isolated PVCs.  November 2017 ECG (independently read by me): Sinus bradycardia 59 bpm with first-degree AV block.  Isolated PAC.  September 2017 ECG (independently read by me): Sinus rhythm at 64 bpm.  First-degree AV block with PR interval of 236 ms.  No significant ST-T changes.  LABS: Recent laboratory by Dr. Dagmar Hait from 12/29/2015 was reviewed.  BMP Latest Ref Rng & Units 03/05/2019 12/26/2015 09/11/2014  Glucose 70 - 99 mg/dL 135(H) 130(H) 147(H)  BUN 8 - 23 mg/dL 15 17 24(H)  Creatinine 0.61 - 1.24 mg/dL 1.20 1.01 1.10  Sodium 135 - 145 mmol/L 143 136 141  Potassium 3.5 - 5.1 mmol/L 4.7 4.1 5.0  Chloride 98 - 111 mmol/L 105 105 106  CO2 22 - 32 mmol/L - 24 -  Calcium 8.9 - 10.3 mg/dL - 9.1 -     Hepatic Function Latest Ref Rng & Units 07/29/2012 04/12/2008 04/10/2008  Total Protein 6.0 - 8.3 g/dL 7.1 6.4 6.4  Albumin 3.5 - 5.2 g/dL 3.9 3.2(L) 3.1(L)  AST 0 - 37 U/L _1 ALT 0 - 53 U/L _2 Alk Phosphatase 39 -  117 U/L 118(H) 98 82  Total Bilirubin 0.3 - 1.2 mg/dL 0.2(L) 0.7 0.6    CBC Latest Ref Rng & Units 03/05/2019 12/26/2015 09/11/2014  WBC 4.0 - 10.5 K/uL - 7.7 -  Hemoglobin 13.0 - 17.0 g/dL 13.9 13.7 15.6  Hematocrit 39 - 52 % 41.0 42.6 46.0  Platelets 150 - 400 K/uL - 166 -   Lab Results  Component Value Date   MCV 90.8 12/26/2015   MCV 84.3 08/06/2012   MCV 87.3 08/05/2012   Lab Results  Component Value Date   TSH 2.019 Test methodology is 3rd generation TSH 04/05/2008   Lab Results  Component Value Date   HGBA1C (H) 05/07/2007    7.2 (NOTE)   The ADA recommends the following therapeutic goals for glycemic   control related to Hgb A1C measurement:   Goal of Therapy:   <  7.0% Hgb A1C   Action Suggested:  > 8.0% Hgb A1C   Ref:  Diabetes Care, 22, Suppl. 1, 1999     BNP No results found for: BNP  ProBNP No results found for: PROBNP   Lipid Panel  No results found for: CHOL, TRIG, HDL, CHOLHDL, VLDL, LDLCALC, LDLDIRECT  RADIOLOGY: No results found.  IMPRESSION:  1. Essential hypertension   2. UARS (upper airway resistance syndrome)   3. Hyperlipidemia with target LDL less than 70   4. Type 2 diabetes mellitus without complication, without long-term current use of insulin (HCC)     ASSESSMENT AND PLAN: Mr. Eldra Word is a young appearing 86 year old- Caucasian male who has a history of type 2 diabetes mellitus,  hypertensio and hyperlipidemia.  He also has a history of first-degree AV block.  An echo Doppler study in 2017 showed normal systolic function with grade 1 diastolic dysfunction, moderate left atrial dilatation and mild aortic insufficiency..  Due to concerns for sleep apnea with his very poor sleep history and being awakened from sleep with his arrhtyhmia  sleep study revealed an AHI  4.9 ; however,  I  believe he may very well have more significant sleep apnea.  He had very poor sleep efficiency and never achieved slow-wave sleep or REM sleep.  He also has probable restless leg syndrome which may also have contributed to some of his reduced sleep efficiency.  Sleep has significantly improved with addition of zolpidem, which he has been taken as needed.  Presently he is sleeping well and is not on any sleep aid.  His blood pressure today remained stable on ramipril 5 mg and there is no orthostatic drop to his blood pressure.  I reviewed recent laboratory.  He continues to be on simvastatin 20 mg for hyperlipidemia.  On March 31, 2019 total cholesterol was 99 with LDL cholesterol 43 triglycerides 103 and HDL 35.  He is diabetic on Metformin and hemoglobin A1c was 6.6 on August 19, 2019.  Thyroid function studies in December 2020 were stable with a TSH  of 2.9.  In November 2020 he underwent bipolar TURP and urethral dilation by Dr. Lovena Neighbours of urology.  Presently, he is doing well and is stable on his current medical regimen.  He will follow up with Dr. Elsworth Soho.  I will see him in 1 year for cardiology reevaluation.   Troy Sine, MD, [facility] 11/21/2019 3:03 PM

## 2019-11-21 NOTE — Patient Instructions (Signed)

## 2019-12-31 DIAGNOSIS — E1169 Type 2 diabetes mellitus with other specified complication: Secondary | ICD-10-CM | POA: Diagnosis not present

## 2019-12-31 DIAGNOSIS — F439 Reaction to severe stress, unspecified: Secondary | ICD-10-CM | POA: Diagnosis not present

## 2019-12-31 DIAGNOSIS — M25519 Pain in unspecified shoulder: Secondary | ICD-10-CM | POA: Diagnosis not present

## 2019-12-31 DIAGNOSIS — E785 Hyperlipidemia, unspecified: Secondary | ICD-10-CM | POA: Diagnosis not present

## 2019-12-31 DIAGNOSIS — I10 Essential (primary) hypertension: Secondary | ICD-10-CM | POA: Diagnosis not present

## 2019-12-31 DIAGNOSIS — R600 Localized edema: Secondary | ICD-10-CM | POA: Diagnosis not present

## 2019-12-31 DIAGNOSIS — N400 Enlarged prostate without lower urinary tract symptoms: Secondary | ICD-10-CM | POA: Diagnosis not present

## 2020-02-10 DIAGNOSIS — H0102A Squamous blepharitis right eye, upper and lower eyelids: Secondary | ICD-10-CM | POA: Diagnosis not present

## 2020-02-10 DIAGNOSIS — H35411 Lattice degeneration of retina, right eye: Secondary | ICD-10-CM | POA: Diagnosis not present

## 2020-02-10 DIAGNOSIS — H0102B Squamous blepharitis left eye, upper and lower eyelids: Secondary | ICD-10-CM | POA: Diagnosis not present

## 2020-02-10 DIAGNOSIS — H04123 Dry eye syndrome of bilateral lacrimal glands: Secondary | ICD-10-CM | POA: Diagnosis not present

## 2020-02-10 DIAGNOSIS — Z961 Presence of intraocular lens: Secondary | ICD-10-CM | POA: Diagnosis not present

## 2020-02-10 DIAGNOSIS — E119 Type 2 diabetes mellitus without complications: Secondary | ICD-10-CM | POA: Diagnosis not present

## 2020-03-04 DIAGNOSIS — Z23 Encounter for immunization: Secondary | ICD-10-CM | POA: Diagnosis not present

## 2020-03-08 DIAGNOSIS — D485 Neoplasm of uncertain behavior of skin: Secondary | ICD-10-CM | POA: Diagnosis not present

## 2020-03-08 DIAGNOSIS — Z85828 Personal history of other malignant neoplasm of skin: Secondary | ICD-10-CM | POA: Diagnosis not present

## 2020-03-08 DIAGNOSIS — D044 Carcinoma in situ of skin of scalp and neck: Secondary | ICD-10-CM | POA: Diagnosis not present

## 2020-03-08 DIAGNOSIS — C44319 Basal cell carcinoma of skin of other parts of face: Secondary | ICD-10-CM | POA: Diagnosis not present

## 2020-03-08 DIAGNOSIS — C4441 Basal cell carcinoma of skin of scalp and neck: Secondary | ICD-10-CM | POA: Diagnosis not present

## 2020-03-08 DIAGNOSIS — L570 Actinic keratosis: Secondary | ICD-10-CM | POA: Diagnosis not present

## 2020-04-28 DIAGNOSIS — Z125 Encounter for screening for malignant neoplasm of prostate: Secondary | ICD-10-CM | POA: Diagnosis not present

## 2020-04-28 DIAGNOSIS — E785 Hyperlipidemia, unspecified: Secondary | ICD-10-CM | POA: Diagnosis not present

## 2020-04-28 DIAGNOSIS — E1169 Type 2 diabetes mellitus with other specified complication: Secondary | ICD-10-CM | POA: Diagnosis not present

## 2020-05-05 DIAGNOSIS — Z0000 Encounter for general adult medical examination without abnormal findings: Secondary | ICD-10-CM | POA: Diagnosis not present

## 2020-05-05 DIAGNOSIS — E1169 Type 2 diabetes mellitus with other specified complication: Secondary | ICD-10-CM | POA: Diagnosis not present

## 2020-05-05 DIAGNOSIS — R600 Localized edema: Secondary | ICD-10-CM | POA: Diagnosis not present

## 2020-05-05 DIAGNOSIS — M25519 Pain in unspecified shoulder: Secondary | ICD-10-CM | POA: Diagnosis not present

## 2020-05-05 DIAGNOSIS — R82998 Other abnormal findings in urine: Secondary | ICD-10-CM | POA: Diagnosis not present

## 2020-05-05 DIAGNOSIS — M1990 Unspecified osteoarthritis, unspecified site: Secondary | ICD-10-CM | POA: Diagnosis not present

## 2020-05-05 DIAGNOSIS — E785 Hyperlipidemia, unspecified: Secondary | ICD-10-CM | POA: Diagnosis not present

## 2020-05-05 DIAGNOSIS — N400 Enlarged prostate without lower urinary tract symptoms: Secondary | ICD-10-CM | POA: Diagnosis not present

## 2020-05-05 DIAGNOSIS — N1831 Chronic kidney disease, stage 3a: Secondary | ICD-10-CM | POA: Diagnosis not present

## 2020-05-05 DIAGNOSIS — F439 Reaction to severe stress, unspecified: Secondary | ICD-10-CM | POA: Diagnosis not present

## 2020-05-05 DIAGNOSIS — I129 Hypertensive chronic kidney disease with stage 1 through stage 4 chronic kidney disease, or unspecified chronic kidney disease: Secondary | ICD-10-CM | POA: Diagnosis not present

## 2020-09-08 DIAGNOSIS — N1831 Chronic kidney disease, stage 3a: Secondary | ICD-10-CM | POA: Diagnosis not present

## 2020-09-08 DIAGNOSIS — E1169 Type 2 diabetes mellitus with other specified complication: Secondary | ICD-10-CM | POA: Diagnosis not present

## 2020-09-08 DIAGNOSIS — N400 Enlarged prostate without lower urinary tract symptoms: Secondary | ICD-10-CM | POA: Diagnosis not present

## 2020-09-08 DIAGNOSIS — E785 Hyperlipidemia, unspecified: Secondary | ICD-10-CM | POA: Diagnosis not present

## 2020-09-08 DIAGNOSIS — F439 Reaction to severe stress, unspecified: Secondary | ICD-10-CM | POA: Diagnosis not present

## 2020-09-08 DIAGNOSIS — I129 Hypertensive chronic kidney disease with stage 1 through stage 4 chronic kidney disease, or unspecified chronic kidney disease: Secondary | ICD-10-CM | POA: Diagnosis not present

## 2020-10-23 DIAGNOSIS — Z20822 Contact with and (suspected) exposure to covid-19: Secondary | ICD-10-CM | POA: Diagnosis not present

## 2020-11-22 ENCOUNTER — Other Ambulatory Visit: Payer: Self-pay | Admitting: Internal Medicine

## 2020-11-22 ENCOUNTER — Other Ambulatory Visit (HOSPITAL_COMMUNITY): Payer: Self-pay | Admitting: Internal Medicine

## 2020-11-22 DIAGNOSIS — E1169 Type 2 diabetes mellitus with other specified complication: Secondary | ICD-10-CM | POA: Diagnosis not present

## 2020-11-22 DIAGNOSIS — I129 Hypertensive chronic kidney disease with stage 1 through stage 4 chronic kidney disease, or unspecified chronic kidney disease: Secondary | ICD-10-CM | POA: Diagnosis not present

## 2020-11-22 DIAGNOSIS — M5410 Radiculopathy, site unspecified: Secondary | ICD-10-CM | POA: Diagnosis not present

## 2020-11-22 DIAGNOSIS — G459 Transient cerebral ischemic attack, unspecified: Secondary | ICD-10-CM

## 2020-11-22 DIAGNOSIS — R29898 Other symptoms and signs involving the musculoskeletal system: Secondary | ICD-10-CM | POA: Diagnosis not present

## 2020-11-22 DIAGNOSIS — N1831 Chronic kidney disease, stage 3a: Secondary | ICD-10-CM | POA: Diagnosis not present

## 2020-11-22 DIAGNOSIS — Z79899 Other long term (current) drug therapy: Secondary | ICD-10-CM | POA: Diagnosis not present

## 2020-11-23 ENCOUNTER — Ambulatory Visit (HOSPITAL_COMMUNITY)
Admission: RE | Admit: 2020-11-23 | Discharge: 2020-11-23 | Disposition: A | Discharge status: Home / Self Care | Payer: Medicare Other | Source: Ambulatory Visit | Attending: Internal Medicine | Admitting: Internal Medicine

## 2020-11-23 ENCOUNTER — Other Ambulatory Visit: Payer: Self-pay

## 2020-11-23 ENCOUNTER — Other Ambulatory Visit (HOSPITAL_COMMUNITY): Payer: Self-pay | Admitting: Internal Medicine

## 2020-11-23 DIAGNOSIS — I6200 Nontraumatic subdural hemorrhage, unspecified: Secondary | ICD-10-CM | POA: Diagnosis not present

## 2020-11-23 DIAGNOSIS — G459 Transient cerebral ischemic attack, unspecified: Secondary | ICD-10-CM

## 2020-11-23 DIAGNOSIS — Z8673 Personal history of transient ischemic attack (TIA), and cerebral infarction without residual deficits: Secondary | ICD-10-CM | POA: Diagnosis not present

## 2020-11-23 DIAGNOSIS — R220 Localized swelling, mass and lump, head: Secondary | ICD-10-CM | POA: Diagnosis not present

## 2020-11-30 ENCOUNTER — Ambulatory Visit (HOSPITAL_COMMUNITY)
Admission: RE | Admit: 2020-11-30 | Discharge: 2020-11-30 | Disposition: A | Discharge status: Home / Self Care | Payer: Medicare Other | Source: Ambulatory Visit | Attending: Internal Medicine | Admitting: Internal Medicine

## 2020-11-30 ENCOUNTER — Other Ambulatory Visit: Payer: Self-pay

## 2020-11-30 DIAGNOSIS — G459 Transient cerebral ischemic attack, unspecified: Secondary | ICD-10-CM | POA: Diagnosis not present

## 2020-11-30 NOTE — Progress Notes (Signed)
Carotid Duplex has been completed.  Results can be found under chart review under CV PROC. 11/30/2020 10:47 AM Lavance Beazer RVT, RDMS

## 2020-12-07 ENCOUNTER — Other Ambulatory Visit: Payer: Self-pay | Admitting: Student

## 2020-12-07 DIAGNOSIS — S065X9A Traumatic subdural hemorrhage with loss of consciousness of unspecified duration, initial encounter: Secondary | ICD-10-CM | POA: Diagnosis not present

## 2020-12-07 DIAGNOSIS — S065XAA Traumatic subdural hemorrhage with loss of consciousness status unknown, initial encounter: Secondary | ICD-10-CM

## 2020-12-08 ENCOUNTER — Ambulatory Visit
Admission: RE | Admit: 2020-12-08 | Discharge: 2020-12-08 | Disposition: A | Discharge status: Home / Self Care | Payer: Medicare Other | Source: Ambulatory Visit | Attending: Student | Admitting: Student

## 2020-12-08 ENCOUNTER — Other Ambulatory Visit: Payer: Self-pay

## 2020-12-08 DIAGNOSIS — R519 Headache, unspecified: Secondary | ICD-10-CM | POA: Diagnosis not present

## 2020-12-08 DIAGNOSIS — S065X9A Traumatic subdural hemorrhage with loss of consciousness of unspecified duration, initial encounter: Secondary | ICD-10-CM

## 2020-12-08 DIAGNOSIS — S065XAA Traumatic subdural hemorrhage with loss of consciousness status unknown, initial encounter: Secondary | ICD-10-CM

## 2020-12-14 ENCOUNTER — Ambulatory Visit (INDEPENDENT_AMBULATORY_CARE_PROVIDER_SITE_OTHER): Payer: Medicare Other | Admitting: Cardiovascular Disease

## 2020-12-14 ENCOUNTER — Other Ambulatory Visit: Payer: Self-pay

## 2020-12-14 DIAGNOSIS — S065XAA Traumatic subdural hemorrhage with loss of consciousness status unknown, initial encounter: Secondary | ICD-10-CM

## 2020-12-14 DIAGNOSIS — S065X9A Traumatic subdural hemorrhage with loss of consciousness of unspecified duration, initial encounter: Secondary | ICD-10-CM | POA: Diagnosis not present

## 2020-12-14 DIAGNOSIS — G478 Other sleep disorders: Secondary | ICD-10-CM

## 2020-12-14 DIAGNOSIS — E119 Type 2 diabetes mellitus without complications: Secondary | ICD-10-CM

## 2020-12-14 DIAGNOSIS — I10 Essential (primary) hypertension: Secondary | ICD-10-CM | POA: Diagnosis not present

## 2020-12-14 DIAGNOSIS — E785 Hyperlipidemia, unspecified: Secondary | ICD-10-CM

## 2020-12-14 NOTE — Patient Instructions (Signed)
Medication Instructions:  PLEASE REMAIN OFF OF THE ASPIRIN. DO NOT TAKE *If you need a refill on your cardiac medications before your next appointment, please call your pharmacy*  Follow-Up: At [facility], you and your health needs are our priority.  As part of our continuing mission to provide you with exceptional heart care, we have created designated Provider Care Teams.  These Care Teams include your primary Cardiologist (physician) and Advanced Practice Providers (APPs -  Physician Assistants and Nurse Practitioners) who all work together to provide you with the care you need, when you need it.  We recommend signing up for the patient portal called "MyChart".  Sign up information is provided on this After Visit Summary.  MyChart is used to connect with patients for Virtual Visits (Telemedicine).  Patients are able to view lab/test results, encounter notes, upcoming appointments, etc.  Non-urgent messages can be sent to your provider as well.   To learn more about what you can do with MyChart, go to [email or web address].    Your next appointment:   1 year(s)  The format for your next appointment:   In Person  Provider:   Shelva Majestic, MD

## 2020-12-14 NOTE — Progress Notes (Signed)
Primary MD: [name]  PATIENT PROFILE: [name] is a 87 y.o. male who presents for a 13 month follow-up cardiology evaluation.  HPI:  [name] has a long-standing history of diabetes mellitus as well as hypertension.  Early Sunday morning on 12/26/2015 he was awakened from sleep at 12:22 AM feeling dizzy and had transient blurred vision.  He denied associated chest pain, shortness of breath, nausea or vomiting.  He felt that his pulse was slow.  Reportedly, his heart rate was in the upper 30s to low 40s.  He stayed in bed until approximate 5 AM at that time his blood pressure recording was elevated.  He ultimately went to the emergency room.  His heart rate apparently was somewhat labile.  He underwent a CT which did not reveal any acute intracranial findings.  There was mild cerebral atrophy.  A chest x-ray revealed no evidence for acute cardiopulmonary abnormality.  Thoracic spondylosis was present.  In the emergency room, his blood pressure was 146/71, pulse was 45.  2 saturation was 97%.  Laboratory revealed a glucose 130.  He had a normal urinalysis.  Troponin was negative.  Hemoglobin 13.7, hematocrit 42.6.   A14 day event monitor from September 26 through 01/31/2016 showed predominant sinus rhythm with rare interpolated PVCs.  There were no episodes of atrial fibrillation or significant bradycardia or tachydysrhythmia.  Because of very poor sleep he underwent a sleep study on 01/24/2016.  This study was interpreted by me and showed an AHI of 4.9 overall, suggesting at least increased upper airway resistance, but I suspect he does have underlying sleep apnea.  He had very poor sleep efficiency at only 32.5% and he never achieved REM sleep. For this reason, I feel that this study is suboptimal and I suspect he did not meet definitive sleep apnea criteria as result of inadequate sleep duration and absence of REM sleep.  He also was noted have frequent periodic limb movements with an  index of 49.76.  He does admit to painful restless legs, with the urge to move.   An  Echo Doppler study to 07/18/2015 showed an EF of 60-65%.  There was grade 1 diastolic dysfunction,  moderate left atrial dilatation, mild aortic insufficiency.   When I saw him in February 2019 he had remained stable since his prior evaluation in January 2018.  He is sleeping much better than he had previously.  He denies chest pain.  He denies change in exercise tolerance.  He denies palpitations.  He continues to take ramipril for hypertension.  He is on simvastatin 20 mg for hyperlipidemia.  Laboratory by [name]. Dagmar Woodard in October 2018 showed an LDL cholesterol at 48.  Hemoglobin A1c was increased at 7.3%.  He is on metformin 500 mg twice a day.    I saw him in February 2019 and his weight was 217 and he remained cardiovascular stable being very active doing yard work and gardening.  He was  seen by me in July 2020 and at that time continue to be on ramipril 5 mg for hypertension and simvastatin 20 mg.  He will be undergoing laboratory this week with his primary physician.  He is diabetic on metformin 500 mg twice a day.  He experiences occasional low back discomfort.  He deniedchest pain PND orthopnea or any recurrent episodes of dizziness.  Continue to be active cutting his grass and doing gardening.   I last saw him on January 22, 2020 and  since his prior evaluation he had remained stable.  He is followed by [name]. Danne Woodard.  Laboratory in December 2020 showed an LDL cholesterol at 43 on simvastatin 20 mg.  He had recent laboratory in April/May 2021 which showed a creatinine of 1.2.  He is diabetic on metformin 500 mg and hemoglobin A1c in April 2021 was 6.6.  He deniedany chest pain or shortness of breath.  He denied palpitations.    He is here in the office today with his daughter who is the sister of my patient [name].  His wife has been having some issues with dementia.  He denies any recurrent episodes of  chest pain.  He has lumbar back discomfort and will be undergoing neurosurgical evaluation with [name]. Saintclair Woodard.  Due to recent headaches and balance issues he underwent an MRI of his brain on November 23, 2020 which showed right lateral convexity subdural hematoma felt to be probably mixed age for possible acute components.  There were multiple septations.  Maximum thickness was 2 cm.  There was mild mass-effect upon the right hemisphere with right to left shift of 3.5 mm.  He subsequently underwent a head CT on August 17 which did not show any progression and it was felt that the collection may be few millimeters smaller now at 3 mm.  His headaches have resolved.  He is followed by [name]. Dagmar Woodard.  He states his blood pressure at home typically is in the 1 20-1 25 range with diastolics in the 73A.  Presently his balance is better and his dizziness has essentially resolved.  He presents for follow-up evaluation.  Past Medical History:  Diagnosis Date   Allergic rhinitis    Anxiety    Arthritis    Benign localized prostatic hyperplasia with lower urinary tract symptoms (LUTS)    Chronic back pain    treated with epidural injections   Complication of anesthesia    delirium with infection post op back surgery 04-01-2008    H/O hiatal hernia    History of cardiac arrhythmia    event monitor 01-18-2016 in epic (per pt heart rate drops),  showed SR, SB, rare episodes intercalated PVCs with no evidence high grade ectopy or atrial fib   Hyperlipidemia    Hypertension    followed by pcp   Sciatica of right side    Type 2 diabetes mellitus ([place])    followed by pcp  (03-04-2019  check's cbg 3 times per week,  fasting cbg 109-140)   Upper airway resistance syndrome    per cardiologist , [name], note in [facility] 11-13-2018  dx from study done 2017 in epic    Urethral stricture     Past Surgical History:  Procedure Laterality Date   APPENDECTOMY  age 11   CATARACT EXTRACTION W/ INTRAOCULAR LENS  IMPLANT, BILATERAL  Bilateral 2017; 2018   CLOSED REDUCTION GREATER HUMERAL TUBEROSITY FRACTURE Left 06-26-2009   '@[facility]    KNEE ARTHROSCOPY Bilateral right 11/ 2007;  left 2012   LAPAROSCOPIC NISSEN FUNDOPLICATION  [id number]   [name] Hassell Done '@[facility]'    LUMBAR SPINE SURGERY  x4  last one 04-01-2008 [name] Ronnald Ramp [facility]    this including ORIF L4 compression fracture and fusion L3-5   NASAL SEPTOPLASTY W/ TURBINOPLASTY  2000   SHOULDER ARTHROSCOPY Left 07-20-2009   [name] Mardelle Matte [facility]    ORIF of greater tuberosity and debridement   STRABISMUS SURGERY Left 09/11/2014   Procedure: REPAIR STRABISMUS LEFT EYE;  Surgeon: Everitt Amber, MD;  Location: [facility];  Service: Ophthalmology;  Laterality: Left;   TONSILLECTOMY AND ADENOIDECTOMY  age 69   TOTAL KNEE ARTHROPLASTY Right 05/13/2007    [name] Noemi Chapel [facility]    TOTAL KNEE ARTHROPLASTY Left 08/05/2012   Procedure: TOTAL KNEE ARTHROPLASTY;  Surgeon: Lorn Junes, MD;  Location: [facility];  Service: Orthopedics;  Laterality: Left;   TRANSURETHRAL RESECTION OF PROSTATE N/A 03/05/2019   Procedure: TRANSURETHRAL RESECTION OF THE PROSTATE (TURP), BIPOLAR/ URETHRAL DILATION;  Surgeon: Ceasar Mons, MD;  Location[facility];  Service: Urology;  Laterality: N/A;    No Known Allergies  Current Outpatient Medications  Medication Sig Dispense Refill   cetirizine (ZYRTEC) 10 MG tablet Take 10 mg by mouth at bedtime.      metFORMIN (GLUCOPHAGE) 500 MG tablet Take 500 mg by mouth 2 (two) times daily.      OVER THE COUNTER MEDICATION Place 1 spray into both nostrils daily as needed (for congestion). [employer] Nasal Spray     ramipril (ALTACE) 5 MG capsule Take 5 mg by mouth at bedtime.      simvastatin (ZOCOR) 20 MG tablet Take 20 mg by mouth every evening.      No current facility-administered medications for this visit.    Social History   Socioeconomic History   Marital status: Married    Spouse name: Not on file   Number of children: Not on file   Years of  education: Not on file   Highest education level: Not on file  Occupational History   Not on file  Tobacco Use   Smoking status: Former    Years: 25.00    Types: Cigarettes    Quit date: 04/29/1983    Years since quitting: 37.6   Smokeless tobacco: Former    Types: Chew    Quit date: 03/03/1986  Vaping Use   Vaping Use: Never used  Substance and Sexual Activity   Alcohol use: No   Drug use: Never   Sexual activity: Not on file  Other Topics Concern   Not on file  Social History Narrative   Not on file   Social Determinants of Health   Financial Resource Strain: Not on file  Food Insecurity: Not on file  Transportation Needs: Not on file  Physical Activity: Not on file  Stress: Not on file  Social Connections: Not on file  Intimate Partner Violence: Not on file   Socially he is married to my patient, [name] who is the mother of my patient [name] (previously [employer]).  He is married for 24 years after being widowed previously.  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  He remains active.  Family History  Problem Relation Age of Onset   Heart attack Father    Stroke Maternal Grandfather    Cancer Other    Stroke Mother    COPD Sister    Other Brother        accident   Heart failure Sister    COPD Brother    Additional family history is notable that his mother died of "old age "at age 42.  His father died at age 73 secondary to leukemia.  His 2 brothers are deceased, one at age 60 with COPD and the other died as a result of an auto accident.  He is 2 deceased sisters, one died at age 24 from COPD and another at age 74 with heart failure.  ROS General: Negative; No fevers, chills, or night sweats HEENT: Negative; No changes in vision or hearing, sinus congestion, difficulty swallowing Pulmonary: Negative; No cough, wheezing, shortness of breath, hemoptysis Cardiovascular:  See HPI;   GI: Negative; No nausea, vomiting, diarrhea, or abdominal pain GU: Negative; No dysuria, hematuria, or difficulty voiding Musculoskeletal: Low back pain discomfort; bilateral knee surgery 2008 in 2012 Hematologic/Oncologic: Negative; no easy bruising, bleeding Endocrine: Negative; no heat/cold intolerance; no diabetes Neuro: Recent balance, headache and episodes of dizziness.  Found to have small right lateral convexity subdural hematoma on MRI of his brain. Skin: Negative; No rashes or skin lesions Psychiatric: Negative; No behavioral problems, depression Sleep: Positive for previously poor sleep with frequent awakenings at least 4 times per night, snoring, nonrestorative sleep.  This improved with zolpidem.  No bruxism, restless legs, hypnogagnic hallucinations Other comprehensive 14 point system review is negative   Physical Exam BP 140/80   Pulse (!) 59   Ht '5\' 11"'  (1.803 m)   Wt 204 lb (92.5 kg)   SpO2 93%   BMI 28.45 kg/m    Repeat blood pressure by me 122/70  Wt Readings from Last 3 Encounters:  12/14/20 204 lb (92.5 kg)  11/21/19 (!) 212 lb (96.2 kg)  03/05/19 209 lb 9 oz (95.1 kg)   General: Alert, oriented, no distress.  Skin: normal turgor, no rashes, warm and dry HEENT: Normocephalic, atraumatic. Pupils equal round and reactive to light; sclera anicteric; extraocular muscles intact;  Nose without nasal septal hypertrophy Mouth/Parynx benign; Mallinpatti scale 3 Neck: No JVD, no carotid bruits; normal carotid upstroke Lungs: clear to ausculatation and percussion; no wheezing or rales Chest wall: without tenderness to palpitation Heart: PMI not displaced, RRR, s1 s2 normal, 1/6 systolic murmur, no diastolic murmur, no rubs, gallops, thrills, or heaves Abdomen: soft, nontender; no hepatosplenomehaly, BS+; abdominal aorta nontender and not dilated by palpation. Back: no CVA tenderness Pulses 2+ Musculoskeletal: full range of motion, normal strength, no joint  deformities Extremities: no clubbing cyanosis or edema, Homan's sign negative  Neurologic: grossly nonfocal; Cranial nerves grossly wnl Psychologic: Normal mood and affect   December 14, 2020 ECG (independently read by me):  Sinus bradycardia at 54; 1st degree AV block  July 2021 ECG (independently read by me): Normal sinus rhythm at 65 bpm.  First-degree block with a PR interval of 266 ms.  No ectopy.  July 2020 ECG (independently read by me): Sinus bradycardia at 58 bpm.  First-degree AV block with a PR interval 222 ms.  February 2019 ECG (independently read by me): Sinus bradycardia 55 bpm.  First-degree AV block with a PR interval of 228 ms.  January 2018 ECG (independently read by me): Normal sinus rhythm at 75 bpm.  First-degree AV block with a PR interval at 214 ms.  Isolated PVCs.  November 2017 ECG (independently read by me): Sinus bradycardia 59 bpm with first-degree AV block.  Isolated PAC.  September 2017 ECG (independently read by me): Sinus rhythm at 64 bpm.  First-degree AV block with PR interval of 236 ms.  No significant ST-T changes.  LABS: Recent laboratory by [name]. Dagmar Woodard from 12/29/2015 was reviewed.  BMP Latest Ref Rng & Units 03/05/2019 12/26/2015 09/11/2014  Glucose 70 - 99 mg/dL 135(H) 130(H) 147(H)  BUN 8 - 23 mg/dL 15 17 24(H)  Creatinine 0.61 - 1.24 mg/dL 1.20 1.01 1.10  Sodium 135 - 145 mmol/L 143 136 141  Potassium 3.5 - 5.1 mmol/L 4.7 4.1 5.0  Chloride 98 - 111  mmol/L 105 105 106  CO2 22 - 32 mmol/L - 24 -  Calcium 8.9 - 10.3 mg/dL - 9.1 -     Hepatic Function Latest Ref Rng & Units 07/29/2012 04/12/2008 04/10/2008  Total Protein 6.0 - 8.3 g/dL 7.1 6.4 6.4  Albumin 3.5 - 5.2 g/dL 3.9 3.2(L) 3.1(L)  AST 0 - 37 U/L '21 18 18  ' ALT 0 - 53 U/L '20 19 20  ' Alk Phosphatase 39 - 117 U/L 118(H) 98 82  Total Bilirubin 0.3 - 1.2 mg/dL 0.2(L) 0.7 0.6    CBC Latest Ref Rng & Units 03/05/2019 12/26/2015 09/11/2014  WBC 4.0 - 10.5 K/uL - 7.7 -  Hemoglobin 13.0 - 17.0 g/dL  13.9 13.7 15.6  Hematocrit 39.0 - 52.0 % 41.0 42.6 46.0  Platelets 150 - 400 K/uL - 166 -   Lab Results  Component Value Date   MCV 90.8 12/26/2015   MCV 84.3 08/06/2012   MCV 87.3 08/05/2012   Lab Results  Component Value Date   TSH 2.019 Test methodology is 3rd generation TSH 04/05/2008   Lab Results  Component Value Date   HGBA1C (H) 05/07/2007    7.2 (NOTE)   The ADA recommends the following therapeutic goals for glycemic   control related to Hgb A1C measurement:   Goal of Therapy:   < 7.0% Hgb A1C   Action Suggested:  > 8.0% Hgb A1C   Ref:  Diabetes Care, 22, Suppl. 1, 1999     BNP No results found for: BNP  ProBNP No results found for: PROBNP   Lipid Panel  No results found for: CHOL, TRIG, HDL, CHOLHDL, VLDL, LDLCALC, LDLDIRECT  RADIOLOGY: CT HEAD WO CONTRAST (5MM)  Result Date: 12/08/2020 CLINICAL DATA:  Follow-up subdural hematoma.  Headache. EXAM: CT HEAD WITHOUT CONTRAST TECHNIQUE: Contiguous axial images were obtained from the base of the skull through the vertex without intravenous contrast. COMPARISON:  MRI 11/23/2020 FINDINGS: Brain: Comparing techniques, the complex, septated, mixed density subdural collection along the right lateral convexity is no larger, and may be a few mm smaller. The largest diameter I can measure today is 16.5 mm, compared with 19.5 mm on the previous study. No evidence of acute hyperdense bleeding of any volume. Tree of mass effect is mild, with right-to-left shift of 3 mm. The brain parenchyma does not show any old or acute focal infarction. No hydrocephalus. Vascular: There is atherosclerotic calcification of the major vessels at the base of the brain. Skull: Negative Sinuses/Orbits: Clear/normal Other: None IMPRESSION: No enlargement of the complex, septated, mixed density subdural collection along the right lateral convexity. Comparing techniques, the collection may even be a few mm smaller. Mild mass-effect with right-to-left shift of 3  mm. Electronically Signed   By: Nelson Chimes M.D.   On: 12/08/2020 15:55   MR BRAIN WO CONTRAST  Result Date: 11/23/2020 CLINICAL DATA:  Transient ischemic attack. EXAM: MRI HEAD WITHOUT CONTRAST TECHNIQUE: Multiplanar, multiecho pulse sequences of the brain and surrounding structures were obtained without intravenous contrast. COMPARISON:  Head CT 12/26/2015 FINDINGS: Brain: Diffusion imaging does not show any acute or subacute infarction. There is an acute or mixed age subdural hematoma on the right with multiple loculations and septations. Maximal thickness is 2 cm. There is mass-effect upon the right hemisphere. Right-to-left midline shift is only 3.5 mm. No abnormality affects the brainstem or cerebellum. No evidence of ischemic stroke, either old or recent. No hydrocephalus. Vascular: Major vessels at the base of the brain show flow. Skull  and upper cervical spine: Negative Sinuses/Orbits: Clear/normal Other: None IMPRESSION: Right lateral convexity subdural hematoma, probably mixed age but with acute components. Multiple septations. Maximal thickness 2 cm. Mass-effect upon the right hemisphere, but with right-to-left shift of only 3.5 mm. No evidence of recent stroke. Call report in progress. Electronically Signed   By: Nelson Chimes M.D.   On: 11/23/2020 17:39   VAS US CAROTID  Result Date: 11/30/2020 Carotid Arterial Duplex Study Patient Name:  MARIO CORONADO  Date of Exam:   11/30/2020 Medical Rec #: [id number]     Accession #:    [id number] Date of Birth: 1931-05-17    Patient Gender: M Patient Age:   74 years Exam Location:  [facility] Procedure:      VAS US CAROTID Referring Phys: Steva Ready AVVA --------------------------------------------------------------------------------  Indications:  TIA. Risk Factors: Hypertension, hyperlipidemia, Diabetes, past history of smoking. Performing Technologist: Rogelia Rohrer RVT, RDMS  Examination Guidelines: A complete evaluation includes B-mode imaging,  spectral Doppler, color Doppler, and power Doppler as needed of all accessible portions of each vessel. Bilateral testing is considered an integral part of a complete examination. Limited examinations for reoccurring indications may be performed as noted.  Right Carotid Findings: +----------+--------+--------+--------+------------------+------------------+           PSV cm/sEDV cm/sStenosisPlaque DescriptionComments           +----------+--------+--------+--------+------------------+------------------+ CCA Prox  72      8                                 intimal thickening +----------+--------+--------+--------+------------------+------------------+ CCA Distal63      6                                 intimal thickening +----------+--------+--------+--------+------------------+------------------+ ICA Prox  32      8                                 tortuous           +----------+--------+--------+--------+------------------+------------------+ ICA Distal73      17                                                   +----------+--------+--------+--------+------------------+------------------+ ECA       86      0                                                    +----------+--------+--------+--------+------------------+------------------+ +----------+--------+-------+----------------+-------------------+           PSV cm/sEDV cmsDescribe        Arm Pressure (mmHG) +----------+--------+-------+----------------+-------------------+ Subclavian100            Multiphasic, WNL                    +----------+--------+-------+----------------+-------------------+ +---------+--------+--+--------+--+---------+ VertebralPSV cm/s45EDV cm/s10Antegrade +---------+--------+--+--------+--+---------+  Left Carotid Findings: +----------+--------+--------+--------+------------------+------------------+           PSV cm/sEDV cm/sStenosisPlaque DescriptionComments            +----------+--------+--------+--------+------------------+------------------+ CCA Prox  96      0                                 intimal thickening +----------+--------+--------+--------+------------------+------------------+ CCA Distal71      0                                 intimal thickening +----------+--------+--------+--------+------------------+------------------+ ICA Prox  65      17                                tortuous           +----------+--------+--------+--------+------------------+------------------+ ICA Distal68      17                                                   +----------+--------+--------+--------+------------------+------------------+ ECA       77      0                                                    +----------+--------+--------+--------+------------------+------------------+ +----------+--------+--------+----------------+-------------------+           PSV cm/sEDV cm/sDescribe        Arm Pressure (mmHG) +----------+--------+--------+----------------+-------------------+ [id number]             Multiphasic, WNL                    +----------+--------+--------+----------------+-------------------+ +---------+--------+--+--------+--+---------+ VertebralPSV cm/s58EDV cm/s17Antegrade +---------+--------+--+--------+--+---------+   Summary: Right Carotid: The extracranial vessels were near-normal with only minimal wall                thickening or plaque. Left Carotid: The extracranial vessels were near-normal with only minimal wall               thickening or plaque. Vertebrals:  Bilateral vertebral arteries demonstrate antegrade flow. Subclavians: Normal flow hemodynamics were seen in bilateral subclavian              arteries. *See table(s) above for measurements and observations.  Electronically signed by Ruta Hinds MD on 11/30/2020 at 5:29:43 PM.    Final     IMPRESSION:  1. Essential hypertension   2. Subdural hematoma  (HCC)   3. UARS (upper airway resistance syndrome)   4. Hyperlipidemia with target LDL less than 70   5. Type 2 diabetes mellitus without complication, without long-term current use of insulin (HCC)      ASSESSMENT AND PLAN: Mr. Demetria Lightsey is a young appearing 87 year old- Caucasian male who has a history of type 2 diabetes mellitus,  hypertension and hyperlipidemia.  He also has a history of first-degree AV block.  An echo Doppler study in 2017 showed normal systolic function with grade 1 diastolic dysfunction, moderate left atrial dilatation and mild aortic insufficiency.  Due to concerns for sleep apnea with his very poor sleep history and being awakened from sleep with his arrhtyhmia  sleep study revealed an AHI  4.9 ; however, I  believe he may very well have more significant sleep apnea.  He had very poor sleep efficiency and never achieved slow-wave sleep or REM sleep.  He also has probable restless leg syndrome which may also have contributed to some of his reduced sleep efficiency.  Sleep has significantly improved with addition of zolpidem, which he has been taken as needed.  Presently he is sleeping well and is not on any sleep aid.  He states his blood pressure at home has been well controlled with typical systolic pressure in the 683F and diastolic blood pressures in the 70s.  He has issues with chronic lumbar back discomfort and will be seeing [name]. Saintclair Woodard for neurosurgical evaluation with potential need for future surgery.  Recently had experience balance issues, headache, and episodes of dizziness.  MR of his brain demonstrated right lateral convexity subdural hematoma with multiple septations and mild right to left shift of 3.5 mm.  A subsequent CT of his head showed slight improvement with right to left shift of 3 mm.  Clinically he feels well.  His headaches have resolved and dizziness is improved.  Blood pressure today on repeat by me was excellent at 122/70.  With his recent subdural  hematoma aspirin has been discontinued and I recommended he continue to stay off this therapy.  He denies any chest pain or breathing issues.  He will be following up with [name]. Dagmar Woodard who checks his laboratory.  He is diabetic on metformin and has been on simvastatin for hyperlipidemia.  LDL cholesterol in January 2022 was excellent at 49 on simvastatin 20 mg.  I will see him in 1 year for follow-up evaluation or sooner as needed.    His blood pressure today remained stable on ramipril 5 mg and there is no orthostatic drop to his blood pressure.  I reviewed recent laboratory.  He continues to be on simvastatin 20 mg for hyperlipidemia.  On March 31, 2019 total cholesterol was 99 with LDL cholesterol 43 triglycerides 103 and HDL 35.  He is diabetic on Metformin and hemoglobin A1c was 6.6 on August 19, 2019.  Thyroid function studies in December 2020 were stable with a TSH of 2.9.  In November 2020 he underwent bipolar TURP and urethral dilation by [name]. Lovena Neighbours of urology.  Presently, he is doing well and is stable on his current medical regimen.  He will follow up with [name]. Elsworth Soho.  I will see him in 1 year for cardiology reevaluation.   Troy Sine, MD, [facility] 12/16/2020 3:25 PM

## 2020-12-16 ENCOUNTER — Encounter: Payer: Self-pay | Admitting: Cardiovascular Disease

## 2020-12-16 DIAGNOSIS — S065X9A Traumatic subdural hemorrhage with loss of consciousness of unspecified duration, initial encounter: Secondary | ICD-10-CM | POA: Diagnosis not present

## 2020-12-16 DIAGNOSIS — R030 Elevated blood-pressure reading, without diagnosis of hypertension: Secondary | ICD-10-CM | POA: Diagnosis not present

## 2020-12-16 DIAGNOSIS — Z6827 Body mass index (BMI) 27.0-27.9, adult: Secondary | ICD-10-CM | POA: Diagnosis not present

## 2020-12-22 ENCOUNTER — Other Ambulatory Visit: Payer: Self-pay | Admitting: Student

## 2020-12-22 DIAGNOSIS — S065XAA Traumatic subdural hemorrhage with loss of consciousness status unknown, initial encounter: Secondary | ICD-10-CM

## 2020-12-22 DIAGNOSIS — S065X9A Traumatic subdural hemorrhage with loss of consciousness of unspecified duration, initial encounter: Secondary | ICD-10-CM

## 2021-01-07 DIAGNOSIS — X32XXXA Exposure to sunlight, initial encounter: Secondary | ICD-10-CM | POA: Diagnosis not present

## 2021-01-07 DIAGNOSIS — L570 Actinic keratosis: Secondary | ICD-10-CM | POA: Diagnosis not present

## 2021-01-07 DIAGNOSIS — L820 Inflamed seborrheic keratosis: Secondary | ICD-10-CM | POA: Diagnosis not present

## 2021-01-10 ENCOUNTER — Ambulatory Visit
Admission: RE | Admit: 2021-01-10 | Discharge: 2021-01-10 | Disposition: A | Discharge status: Home / Self Care | Payer: Medicare Other | Source: Ambulatory Visit | Attending: Student | Admitting: Student

## 2021-01-10 DIAGNOSIS — S065XAA Traumatic subdural hemorrhage with loss of consciousness status unknown, initial encounter: Secondary | ICD-10-CM

## 2021-01-10 DIAGNOSIS — S065X9A Traumatic subdural hemorrhage with loss of consciousness of unspecified duration, initial encounter: Secondary | ICD-10-CM

## 2021-01-10 DIAGNOSIS — I6200 Nontraumatic subdural hemorrhage, unspecified: Secondary | ICD-10-CM | POA: Diagnosis not present

## 2021-01-12 DIAGNOSIS — M5410 Radiculopathy, site unspecified: Secondary | ICD-10-CM | POA: Diagnosis not present

## 2021-01-12 DIAGNOSIS — R29898 Other symptoms and signs involving the musculoskeletal system: Secondary | ICD-10-CM | POA: Diagnosis not present

## 2021-01-12 DIAGNOSIS — N1831 Chronic kidney disease, stage 3a: Secondary | ICD-10-CM | POA: Diagnosis not present

## 2021-01-12 DIAGNOSIS — Z23 Encounter for immunization: Secondary | ICD-10-CM | POA: Diagnosis not present

## 2021-01-12 DIAGNOSIS — S065X9A Traumatic subdural hemorrhage with loss of consciousness of unspecified duration, initial encounter: Secondary | ICD-10-CM | POA: Diagnosis not present

## 2021-01-12 DIAGNOSIS — G459 Transient cerebral ischemic attack, unspecified: Secondary | ICD-10-CM | POA: Diagnosis not present

## 2021-01-12 DIAGNOSIS — F439 Reaction to severe stress, unspecified: Secondary | ICD-10-CM | POA: Diagnosis not present

## 2021-01-12 DIAGNOSIS — I129 Hypertensive chronic kidney disease with stage 1 through stage 4 chronic kidney disease, or unspecified chronic kidney disease: Secondary | ICD-10-CM | POA: Diagnosis not present

## 2021-01-12 DIAGNOSIS — E1169 Type 2 diabetes mellitus with other specified complication: Secondary | ICD-10-CM | POA: Diagnosis not present

## 2021-01-13 DIAGNOSIS — S065X9A Traumatic subdural hemorrhage with loss of consciousness of unspecified duration, initial encounter: Secondary | ICD-10-CM | POA: Diagnosis not present

## 2021-01-14 DIAGNOSIS — M48061 Spinal stenosis, lumbar region without neurogenic claudication: Secondary | ICD-10-CM | POA: Diagnosis not present

## 2021-02-08 DIAGNOSIS — M25551 Pain in right hip: Secondary | ICD-10-CM | POA: Diagnosis not present

## 2021-02-08 DIAGNOSIS — M48061 Spinal stenosis, lumbar region without neurogenic claudication: Secondary | ICD-10-CM | POA: Diagnosis not present

## 2021-02-09 DIAGNOSIS — M25551 Pain in right hip: Secondary | ICD-10-CM | POA: Diagnosis not present

## 2021-03-10 DIAGNOSIS — M25551 Pain in right hip: Secondary | ICD-10-CM | POA: Diagnosis not present

## 2021-03-10 DIAGNOSIS — M5416 Radiculopathy, lumbar region: Secondary | ICD-10-CM | POA: Diagnosis not present

## 2021-03-10 DIAGNOSIS — M48061 Spinal stenosis, lumbar region without neurogenic claudication: Secondary | ICD-10-CM | POA: Diagnosis not present

## 2021-03-21 DIAGNOSIS — M48061 Spinal stenosis, lumbar region without neurogenic claudication: Secondary | ICD-10-CM | POA: Diagnosis not present

## 2021-03-31 DIAGNOSIS — Z20822 Contact with and (suspected) exposure to covid-19: Secondary | ICD-10-CM | POA: Diagnosis not present

## 2021-04-19 DIAGNOSIS — M48061 Spinal stenosis, lumbar region without neurogenic claudication: Secondary | ICD-10-CM | POA: Diagnosis not present

## 2021-04-19 DIAGNOSIS — M25551 Pain in right hip: Secondary | ICD-10-CM | POA: Diagnosis not present

## 2021-04-19 DIAGNOSIS — M5416 Radiculopathy, lumbar region: Secondary | ICD-10-CM | POA: Diagnosis not present

## 2021-05-17 DIAGNOSIS — Z20822 Contact with and (suspected) exposure to covid-19: Secondary | ICD-10-CM | POA: Diagnosis not present

## 2021-05-20 DIAGNOSIS — E1169 Type 2 diabetes mellitus with other specified complication: Secondary | ICD-10-CM | POA: Diagnosis not present

## 2021-05-20 DIAGNOSIS — Z125 Encounter for screening for malignant neoplasm of prostate: Secondary | ICD-10-CM | POA: Diagnosis not present

## 2021-05-20 DIAGNOSIS — I10 Essential (primary) hypertension: Secondary | ICD-10-CM | POA: Diagnosis not present

## 2021-05-20 DIAGNOSIS — E785 Hyperlipidemia, unspecified: Secondary | ICD-10-CM | POA: Diagnosis not present

## 2021-05-27 DIAGNOSIS — D696 Thrombocytopenia, unspecified: Secondary | ICD-10-CM | POA: Diagnosis not present

## 2021-05-27 DIAGNOSIS — M5410 Radiculopathy, site unspecified: Secondary | ICD-10-CM | POA: Diagnosis not present

## 2021-05-27 DIAGNOSIS — K5900 Constipation, unspecified: Secondary | ICD-10-CM | POA: Diagnosis not present

## 2021-05-27 DIAGNOSIS — R82998 Other abnormal findings in urine: Secondary | ICD-10-CM | POA: Diagnosis not present

## 2021-05-27 DIAGNOSIS — Z0000 Encounter for general adult medical examination without abnormal findings: Secondary | ICD-10-CM | POA: Diagnosis not present

## 2021-05-27 DIAGNOSIS — F439 Reaction to severe stress, unspecified: Secondary | ICD-10-CM | POA: Diagnosis not present

## 2021-05-27 DIAGNOSIS — I129 Hypertensive chronic kidney disease with stage 1 through stage 4 chronic kidney disease, or unspecified chronic kidney disease: Secondary | ICD-10-CM | POA: Diagnosis not present

## 2021-05-27 DIAGNOSIS — N400 Enlarged prostate without lower urinary tract symptoms: Secondary | ICD-10-CM | POA: Diagnosis not present

## 2021-05-27 DIAGNOSIS — R29898 Other symptoms and signs involving the musculoskeletal system: Secondary | ICD-10-CM | POA: Diagnosis not present

## 2021-05-27 DIAGNOSIS — N1831 Chronic kidney disease, stage 3a: Secondary | ICD-10-CM | POA: Diagnosis not present

## 2021-05-27 DIAGNOSIS — E1169 Type 2 diabetes mellitus with other specified complication: Secondary | ICD-10-CM | POA: Diagnosis not present

## 2021-05-27 DIAGNOSIS — G459 Transient cerebral ischemic attack, unspecified: Secondary | ICD-10-CM | POA: Diagnosis not present

## 2021-05-27 DIAGNOSIS — Z23 Encounter for immunization: Secondary | ICD-10-CM | POA: Diagnosis not present

## 2021-07-06 DIAGNOSIS — Z20822 Contact with and (suspected) exposure to covid-19: Secondary | ICD-10-CM | POA: Diagnosis not present

## 2021-07-07 DIAGNOSIS — R634 Abnormal weight loss: Secondary | ICD-10-CM | POA: Diagnosis not present

## 2021-07-07 DIAGNOSIS — Z20822 Contact with and (suspected) exposure to covid-19: Secondary | ICD-10-CM | POA: Diagnosis not present

## 2021-07-07 DIAGNOSIS — J069 Acute upper respiratory infection, unspecified: Secondary | ICD-10-CM | POA: Diagnosis not present

## 2021-07-07 DIAGNOSIS — Z1152 Encounter for screening for COVID-19: Secondary | ICD-10-CM | POA: Diagnosis not present

## 2021-07-07 DIAGNOSIS — R058 Other specified cough: Secondary | ICD-10-CM | POA: Diagnosis not present

## 2021-07-07 DIAGNOSIS — R0981 Nasal congestion: Secondary | ICD-10-CM | POA: Diagnosis not present

## 2021-07-07 DIAGNOSIS — R5383 Other fatigue: Secondary | ICD-10-CM | POA: Diagnosis not present

## 2021-07-12 DIAGNOSIS — Z20822 Contact with and (suspected) exposure to covid-19: Secondary | ICD-10-CM | POA: Diagnosis not present

## 2021-08-10 DIAGNOSIS — Z20822 Contact with and (suspected) exposure to covid-19: Secondary | ICD-10-CM | POA: Diagnosis not present

## 2021-08-11 DIAGNOSIS — Z20822 Contact with and (suspected) exposure to covid-19: Secondary | ICD-10-CM | POA: Diagnosis not present

## 2021-08-24 DIAGNOSIS — Z20822 Contact with and (suspected) exposure to covid-19: Secondary | ICD-10-CM | POA: Diagnosis not present

## 2021-10-21 DIAGNOSIS — M5410 Radiculopathy, site unspecified: Secondary | ICD-10-CM | POA: Diagnosis not present

## 2021-10-21 DIAGNOSIS — I129 Hypertensive chronic kidney disease with stage 1 through stage 4 chronic kidney disease, or unspecified chronic kidney disease: Secondary | ICD-10-CM | POA: Diagnosis not present

## 2021-10-21 DIAGNOSIS — F439 Reaction to severe stress, unspecified: Secondary | ICD-10-CM | POA: Diagnosis not present

## 2021-10-21 DIAGNOSIS — D696 Thrombocytopenia, unspecified: Secondary | ICD-10-CM | POA: Diagnosis not present

## 2021-10-21 DIAGNOSIS — S065X9A Traumatic subdural hemorrhage with loss of consciousness of unspecified duration, initial encounter: Secondary | ICD-10-CM | POA: Diagnosis not present

## 2021-10-21 DIAGNOSIS — N1831 Chronic kidney disease, stage 3a: Secondary | ICD-10-CM | POA: Diagnosis not present

## 2021-10-21 DIAGNOSIS — G459 Transient cerebral ischemic attack, unspecified: Secondary | ICD-10-CM | POA: Diagnosis not present

## 2021-10-21 DIAGNOSIS — R29898 Other symptoms and signs involving the musculoskeletal system: Secondary | ICD-10-CM | POA: Diagnosis not present

## 2021-10-21 DIAGNOSIS — R609 Edema, unspecified: Secondary | ICD-10-CM | POA: Diagnosis not present

## 2021-10-21 DIAGNOSIS — E1169 Type 2 diabetes mellitus with other specified complication: Secondary | ICD-10-CM | POA: Diagnosis not present

## 2021-10-21 DIAGNOSIS — K5900 Constipation, unspecified: Secondary | ICD-10-CM | POA: Diagnosis not present

## 2021-10-25 DIAGNOSIS — Z20828 Contact with and (suspected) exposure to other viral communicable diseases: Secondary | ICD-10-CM | POA: Diagnosis not present

## 2021-11-17 DIAGNOSIS — W19XXXA Unspecified fall, initial encounter: Secondary | ICD-10-CM | POA: Diagnosis not present

## 2021-11-17 DIAGNOSIS — Z7189 Other specified counseling: Secondary | ICD-10-CM | POA: Diagnosis not present

## 2021-11-17 DIAGNOSIS — N3281 Overactive bladder: Secondary | ICD-10-CM | POA: Diagnosis not present

## 2021-11-17 DIAGNOSIS — R29898 Other symptoms and signs involving the musculoskeletal system: Secondary | ICD-10-CM | POA: Diagnosis not present

## 2021-11-17 DIAGNOSIS — I129 Hypertensive chronic kidney disease with stage 1 through stage 4 chronic kidney disease, or unspecified chronic kidney disease: Secondary | ICD-10-CM | POA: Diagnosis not present

## 2021-11-17 DIAGNOSIS — E1169 Type 2 diabetes mellitus with other specified complication: Secondary | ICD-10-CM | POA: Diagnosis not present

## 2021-11-17 DIAGNOSIS — N1831 Chronic kidney disease, stage 3a: Secondary | ICD-10-CM | POA: Diagnosis not present

## 2021-11-17 DIAGNOSIS — F439 Reaction to severe stress, unspecified: Secondary | ICD-10-CM | POA: Diagnosis not present

## 2022-01-18 DIAGNOSIS — N3281 Overactive bladder: Secondary | ICD-10-CM | POA: Diagnosis not present

## 2022-01-18 DIAGNOSIS — Z23 Encounter for immunization: Secondary | ICD-10-CM | POA: Diagnosis not present

## 2022-01-18 DIAGNOSIS — F439 Reaction to severe stress, unspecified: Secondary | ICD-10-CM | POA: Diagnosis not present

## 2022-01-18 DIAGNOSIS — S065X9A Traumatic subdural hemorrhage with loss of consciousness of unspecified duration, initial encounter: Secondary | ICD-10-CM | POA: Diagnosis not present

## 2022-01-18 DIAGNOSIS — K5900 Constipation, unspecified: Secondary | ICD-10-CM | POA: Diagnosis not present

## 2022-01-18 DIAGNOSIS — E1169 Type 2 diabetes mellitus with other specified complication: Secondary | ICD-10-CM | POA: Diagnosis not present

## 2022-01-18 DIAGNOSIS — N1831 Chronic kidney disease, stage 3a: Secondary | ICD-10-CM | POA: Diagnosis not present

## 2022-01-18 DIAGNOSIS — N400 Enlarged prostate without lower urinary tract symptoms: Secondary | ICD-10-CM | POA: Diagnosis not present

## 2022-01-18 DIAGNOSIS — I129 Hypertensive chronic kidney disease with stage 1 through stage 4 chronic kidney disease, or unspecified chronic kidney disease: Secondary | ICD-10-CM | POA: Diagnosis not present

## 2022-01-31 DIAGNOSIS — H35411 Lattice degeneration of retina, right eye: Secondary | ICD-10-CM | POA: Diagnosis not present

## 2022-01-31 DIAGNOSIS — Z961 Presence of intraocular lens: Secondary | ICD-10-CM | POA: Diagnosis not present

## 2022-01-31 DIAGNOSIS — H04123 Dry eye syndrome of bilateral lacrimal glands: Secondary | ICD-10-CM | POA: Diagnosis not present

## 2022-01-31 DIAGNOSIS — H0102A Squamous blepharitis right eye, upper and lower eyelids: Secondary | ICD-10-CM | POA: Diagnosis not present

## 2022-01-31 DIAGNOSIS — H0102B Squamous blepharitis left eye, upper and lower eyelids: Secondary | ICD-10-CM | POA: Diagnosis not present

## 2022-01-31 DIAGNOSIS — E119 Type 2 diabetes mellitus without complications: Secondary | ICD-10-CM | POA: Diagnosis not present

## 2022-06-06 DIAGNOSIS — E1169 Type 2 diabetes mellitus with other specified complication: Secondary | ICD-10-CM | POA: Diagnosis not present

## 2022-06-06 DIAGNOSIS — I10 Essential (primary) hypertension: Secondary | ICD-10-CM | POA: Diagnosis not present

## 2022-06-06 DIAGNOSIS — R7989 Other specified abnormal findings of blood chemistry: Secondary | ICD-10-CM | POA: Diagnosis not present

## 2022-06-06 DIAGNOSIS — Z125 Encounter for screening for malignant neoplasm of prostate: Secondary | ICD-10-CM | POA: Diagnosis not present

## 2022-06-06 DIAGNOSIS — E785 Hyperlipidemia, unspecified: Secondary | ICD-10-CM | POA: Diagnosis not present

## 2022-06-13 ENCOUNTER — Other Ambulatory Visit: Payer: Self-pay | Admitting: Internal Medicine

## 2022-06-13 DIAGNOSIS — E1169 Type 2 diabetes mellitus with other specified complication: Secondary | ICD-10-CM | POA: Diagnosis not present

## 2022-06-13 DIAGNOSIS — I10 Essential (primary) hypertension: Secondary | ICD-10-CM | POA: Diagnosis not present

## 2022-06-13 DIAGNOSIS — N1831 Chronic kidney disease, stage 3a: Secondary | ICD-10-CM

## 2022-06-13 DIAGNOSIS — K5900 Constipation, unspecified: Secondary | ICD-10-CM | POA: Diagnosis not present

## 2022-06-13 DIAGNOSIS — Z7189 Other specified counseling: Secondary | ICD-10-CM | POA: Diagnosis not present

## 2022-06-13 DIAGNOSIS — R82998 Other abnormal findings in urine: Secondary | ICD-10-CM | POA: Diagnosis not present

## 2022-06-13 DIAGNOSIS — N400 Enlarged prostate without lower urinary tract symptoms: Secondary | ICD-10-CM | POA: Diagnosis not present

## 2022-06-13 DIAGNOSIS — I129 Hypertensive chronic kidney disease with stage 1 through stage 4 chronic kidney disease, or unspecified chronic kidney disease: Secondary | ICD-10-CM | POA: Diagnosis not present

## 2022-06-13 DIAGNOSIS — Z0000 Encounter for general adult medical examination without abnormal findings: Secondary | ICD-10-CM | POA: Diagnosis not present

## 2022-06-13 DIAGNOSIS — N3281 Overactive bladder: Secondary | ICD-10-CM | POA: Diagnosis not present

## 2022-06-13 DIAGNOSIS — S065X9A Traumatic subdural hemorrhage with loss of consciousness of unspecified duration, initial encounter: Secondary | ICD-10-CM | POA: Diagnosis not present

## 2022-06-13 DIAGNOSIS — R29898 Other symptoms and signs involving the musculoskeletal system: Secondary | ICD-10-CM | POA: Diagnosis not present

## 2022-06-13 DIAGNOSIS — F439 Reaction to severe stress, unspecified: Secondary | ICD-10-CM | POA: Diagnosis not present

## 2022-07-12 ENCOUNTER — Ambulatory Visit
Admission: RE | Admit: 2022-07-12 | Discharge: 2022-07-12 | Disposition: A | Discharge status: Home / Self Care | Payer: Medicare Other | Source: Ambulatory Visit | Attending: Internal Medicine | Admitting: Internal Medicine

## 2022-07-12 DIAGNOSIS — N189 Chronic kidney disease, unspecified: Secondary | ICD-10-CM | POA: Diagnosis not present

## 2022-07-12 DIAGNOSIS — N281 Cyst of kidney, acquired: Secondary | ICD-10-CM | POA: Diagnosis not present

## 2022-07-12 DIAGNOSIS — N1831 Chronic kidney disease, stage 3a: Secondary | ICD-10-CM

## 2022-08-04 DIAGNOSIS — N400 Enlarged prostate without lower urinary tract symptoms: Secondary | ICD-10-CM | POA: Diagnosis not present

## 2022-08-04 DIAGNOSIS — D696 Thrombocytopenia, unspecified: Secondary | ICD-10-CM | POA: Diagnosis not present

## 2022-08-04 DIAGNOSIS — I129 Hypertensive chronic kidney disease with stage 1 through stage 4 chronic kidney disease, or unspecified chronic kidney disease: Secondary | ICD-10-CM | POA: Diagnosis not present

## 2022-08-04 DIAGNOSIS — M1990 Unspecified osteoarthritis, unspecified site: Secondary | ICD-10-CM | POA: Diagnosis not present

## 2022-08-04 DIAGNOSIS — F439 Reaction to severe stress, unspecified: Secondary | ICD-10-CM | POA: Diagnosis not present

## 2022-08-04 DIAGNOSIS — K5900 Constipation, unspecified: Secondary | ICD-10-CM | POA: Diagnosis not present

## 2022-08-04 DIAGNOSIS — E1169 Type 2 diabetes mellitus with other specified complication: Secondary | ICD-10-CM | POA: Diagnosis not present

## 2022-08-04 DIAGNOSIS — N1831 Chronic kidney disease, stage 3a: Secondary | ICD-10-CM | POA: Diagnosis not present

## 2022-08-04 DIAGNOSIS — Z7189 Other specified counseling: Secondary | ICD-10-CM | POA: Diagnosis not present

## 2022-08-04 DIAGNOSIS — N3281 Overactive bladder: Secondary | ICD-10-CM | POA: Diagnosis not present

## 2022-10-04 DIAGNOSIS — F439 Reaction to severe stress, unspecified: Secondary | ICD-10-CM | POA: Diagnosis not present

## 2022-10-04 DIAGNOSIS — Z7189 Other specified counseling: Secondary | ICD-10-CM | POA: Diagnosis not present

## 2022-10-04 DIAGNOSIS — E1169 Type 2 diabetes mellitus with other specified complication: Secondary | ICD-10-CM | POA: Diagnosis not present

## 2022-10-04 DIAGNOSIS — K5900 Constipation, unspecified: Secondary | ICD-10-CM | POA: Diagnosis not present

## 2022-10-04 DIAGNOSIS — N400 Enlarged prostate without lower urinary tract symptoms: Secondary | ICD-10-CM | POA: Diagnosis not present

## 2022-10-04 DIAGNOSIS — M1990 Unspecified osteoarthritis, unspecified site: Secondary | ICD-10-CM | POA: Diagnosis not present

## 2022-10-04 DIAGNOSIS — R29898 Other symptoms and signs involving the musculoskeletal system: Secondary | ICD-10-CM | POA: Diagnosis not present

## 2022-10-04 DIAGNOSIS — I129 Hypertensive chronic kidney disease with stage 1 through stage 4 chronic kidney disease, or unspecified chronic kidney disease: Secondary | ICD-10-CM | POA: Diagnosis not present

## 2022-10-04 DIAGNOSIS — N1831 Chronic kidney disease, stage 3a: Secondary | ICD-10-CM | POA: Diagnosis not present

## 2022-10-04 DIAGNOSIS — D696 Thrombocytopenia, unspecified: Secondary | ICD-10-CM | POA: Diagnosis not present

## 2022-10-04 DIAGNOSIS — N3281 Overactive bladder: Secondary | ICD-10-CM | POA: Diagnosis not present

## 2022-11-11 ENCOUNTER — Ambulatory Visit (INDEPENDENT_AMBULATORY_CARE_PROVIDER_SITE_OTHER): Payer: Medicare Other

## 2022-11-11 ENCOUNTER — Ambulatory Visit
Admission: EM | Admit: 2022-11-11 | Discharge: 2022-11-11 | Disposition: A | Discharge status: Home / Self Care | Payer: Medicare Other

## 2022-11-11 DIAGNOSIS — S52515A Nondisplaced fracture of left radial styloid process, initial encounter for closed fracture: Secondary | ICD-10-CM

## 2022-11-11 DIAGNOSIS — S50312A Abrasion of left elbow, initial encounter: Secondary | ICD-10-CM | POA: Diagnosis not present

## 2022-11-11 MED ORDER — MUPIROCIN 2 % EX OINT: 1.0000 | TOPICAL_OINTMENT | Freq: Two times a day (BID) | CUTANEOUS | 0 refills | Status: DC

## 2022-11-11 NOTE — ED Provider Notes (Signed)
[facility]    CSN: [id number] Arrival date & time: 11/11/22  1238      History   Chief Complaint Chief Complaint  Patient presents with   Fall    HPI [name] is a 89 y.o. male.   The history is provided by the patient.   The patient presents with his daughter for complaints of left wrist pain that started after he fell approximately 4 days ago.  Patient also injured the left elbow at the time of the fall.  Patient states that initially after the injury occurred, he applied alcohol and liniment to the left forearm and wrist.  Patient states he had swelling in his wrist and fingers initially after the injury occurred.  That has since improved.  He states that he has pain mostly when he is bending the wrist "backward".  Patient states that "I do good when I am in a certain position".  He states that his pain worsens at nighttime when he is sleep.  Patient denies numbness, tingling, or radiation of pain.  Patient is right-hand dominant.  Past Medical History:  Diagnosis Date   Allergic rhinitis    Anxiety    Arthritis    Benign localized prostatic hyperplasia with lower urinary tract symptoms (LUTS)    Chronic back pain    treated with epidural injections   Complication of anesthesia    delirium with infection post op back surgery 04-01-2008    H/O hiatal hernia    History of cardiac arrhythmia    event monitor 01-18-2016 in epic (per pt heart rate drops),  showed SR, SB, rare episodes intercalated PVCs with no evidence high grade ectopy or atrial fib   Hyperlipidemia    Hypertension    followed by pcp   Sciatica of right side    Type 2 diabetes mellitus (HCC)    followed by pcp  (03-04-2019  check's cbg 3 times per week,  fasting cbg 109-140)   Upper airway resistance syndrome    per cardiologist , dr Tresa Endo, note in eip[facility] 11-13-2018  dx from study done 2017 in epic    Urethral stricture     Patient Active Problem List   Diagnosis Date Noted   BPH with  urinary obstruction 03/05/2019   Excessive daytime sleepiness 01/26/2016   OSA (obstructive sleep apnea) 01/26/2016   Snoring 12/29/2015   Other cardiac arrhythmia 12/29/2015   Diabetes mellitus (HCC)    Hypertension    Hypercholesteremia    Left knee DJD    S/P total knee arthroplasty 05/13/2007    Past Surgical History:  Procedure Laterality Date   APPENDECTOMY  age 49   CATARACT EXTRACTION W/ INTRAOCULAR LENS  IMPLANT, BILATERAL Bilateral 2017; 2018   CLOSED REDUCTION GREATER HUMERAL TUBEROSITY FRACTURE Left 06-26-2009   @[facility]    KNEE ARTHROSCOPY Bilateral right 11/ 2007;  left 2012   LAPAROSCOPIC NISSEN FUNDOPLICATION  11-07-2000   dr Daphine Deutscher @[facility]    LUMBAR SPINE SURGERY  x4  last one 04-01-2008 dr Yetta Barre @[facility]    this including ORIF L4 compression fracture and fusion L3-5   NASAL SEPTOPLASTY W/ TURBINOPLASTY  2000   SHOULDER ARTHROSCOPY Left 07-20-2009   dr Dion Saucier @[facility]    ORIF of greater tuberosity and debridement   STRABISMUS SURGERY Left 09/11/2014   Procedure: REPAIR STRABISMUS LEFT EYE;  Surgeon: Verne Carrow, MD;  Location: [facility];  Service: Ophthalmology;  Laterality: Left;   TONSILLECTOMY AND ADENOIDECTOMY  age 54   TOTAL KNEE ARTHROPLASTY  Right 05/13/2007    dr Thurston Hole @[facility]    TOTAL KNEE ARTHROPLASTY Left 08/05/2012   Procedure: TOTAL KNEE ARTHROPLASTY;  Surgeon: Nilda Simmer, MD;  Location: [facility] OR;  Service: Orthopedics;  Laterality: Left;   TRANSURETHRAL RESECTION OF PROSTATE N/A 03/05/2019   Procedure: TRANSURETHRAL RESECTION OF THE PROSTATE (TURP), BIPOLAR/ URETHRAL DILATION;  Surgeon: Rene Paci, MD;  Location: [facility];  Service: Urology;  Laterality: N/A;       Home Medications    Prior to Admission medications   Medication Sig Start Date End Date Taking? Authorizing Provider  aspirin EC 81 MG tablet Take 81 mg by mouth daily. Swallow whole.   Yes [provider]  mupirocin ointment (BACTROBAN) 2 % Apply 1  Application topically 2 (two) times daily. 11/11/22  Yes Janara Klett-Warren, Sadie Haber, NP  cetirizine (ZYRTEC) 10 MG tablet Take 10 mg by mouth at bedtime.     [provider]  metFORMIN (GLUCOPHAGE) 500 MG tablet Take 500 mg by mouth 2 (two) times daily.     [provider]  OVER THE COUNTER MEDICATION Place 1 spray into both nostrils daily as needed (for congestion). [employer] Nasal Spray    [provider]  ramipril (ALTACE) 5 MG capsule Take 5 mg by mouth at bedtime.  12/06/15   [provider]  simvastatin (ZOCOR) 20 MG tablet Take 20 mg by mouth every evening.     [provider]    Family History Family History  Problem Relation Age of Onset   Heart attack Father    Stroke Maternal Grandfather    Cancer Other    Stroke Mother    COPD Sister    Other Brother        accident   Heart failure Sister    COPD Brother     Social History Social History   Tobacco Use   Smoking status: Former    Current packs/day: 0.00    Types: Cigarettes    Start date: 04/28/1958    Quit date: 04/29/1983    Years since quitting: 39.5   Smokeless tobacco: Former    Types: Chew    Quit date: 03/03/1986  Vaping Use   Vaping status: Never Used  Substance Use Topics   Alcohol use: No   Drug use: Never     Allergies   Patient has no known allergies.   Review of Systems Review of Systems Per HPI  Physical Exam Triage Vital Signs ED Triage Vitals  Encounter Vitals Group     BP 11/11/22 1257 125/62     Systolic BP Percentile --      Diastolic BP Percentile --      Pulse Rate 11/11/22 1257 81     Resp 11/11/22 1257 20     Temp 11/11/22 1257 98.2 F (36.8 C)     Temp Source 11/11/22 1257 Oral     SpO2 11/11/22 1257 90 %     Weight --      Height --      Head Circumference --      Peak Flow --      Pain Score 11/11/22 1301 5     Pain Loc --      Pain Education --      Exclude from Growth Chart --    No data found.  Updated Vital Signs BP  125/62 (BP Location: Right Arm)   Pulse 81   Temp 98.2 F (36.8 C) (Oral)  Resp 20   SpO2 90%   Visual Acuity Right Eye Distance:   Left Eye Distance:   Bilateral Distance:    Right Eye Near:   Left Eye Near:    Bilateral Near:     Physical Exam Vitals and nursing note reviewed.  Constitutional:      General: He is not in acute distress.    Appearance: Normal appearance.  HENT:     Head: Normocephalic.  Eyes:     Extraocular Movements: Extraocular movements intact.     Pupils: Pupils are equal, round, and reactive to light.  Pulmonary:     Effort: Pulmonary effort is normal.  Musculoskeletal:     Left forearm: Normal. No swelling, edema or tenderness.     Left wrist: Swelling, deformity and tenderness present. Decreased range of motion. Normal pulse.     Comments: Swelling and tenderness noted to the radial and ulnar aspect of the left wrist.  Pain increases with flexion and extension, increase more with flexion.  Skin:    General: Skin is warm and dry.     Findings: Abrasion present.     Comments: Abrasion noted to the lateral aspect of the left elbow.  Abrasion measures approximately 6 to 7 cm in diameter.  Localized erythema present.  There is no oozing, fluctuance, or drainage present.  Neurological:     General: No focal deficit present.     Mental Status: He is alert and oriented to person, place, and time.  Psychiatric:        Mood and Affect: Mood normal.        Behavior: Behavior normal.      UC Treatments / Results  Labs (all labs ordered are listed, but only abnormal results are displayed) Labs Reviewed - No data to display  EKG   Radiology DG Forearm Left  Result Date: 11/11/2022 CLINICAL DATA:  Patient fell on concrete 4 days ago. Soft tissue wound. EXAM: LEFT FOREARM - 2 VIEW COMPARISON:  None Available. FINDINGS: Nondisplaced radial styloid fracture. No associated fracture of the ulna evident. Degenerative changes noted first carpometacarpal  joint. Calcification noted TFCC. Bones are demineralized. IMPRESSION: Nondisplaced radial styloid fracture. Electronically Signed   By: Kennith Center M.D.   On: 11/11/2022 13:16    Procedures Procedures (including critical care time)  Medications Ordered in UC Medications - No data to display  Initial Impression / Assessment and Plan / UC Course  I have reviewed the triage vital signs and the nursing notes.  Pertinent labs & imaging results that were available during my care of the patient were reviewed by me and considered in my medical decision making (see chart for details).  The patient is well-appearing, he is in no acute distress, vital signs are stable.  X-ray shows a nondisplaced radial styloid fracture of the left wrist.  A radial gutter splint was applied to allow for room to apply dressing to the left elbow.  Left elbow was dressed and cleansed, topical antibiotic ointment was also applied.  Supportive care recommendations were provided and discussed with the patient's daughter to include RICE therapy, over-the-counter Tylenol arthritis strength 650 mg tablets, and wound care of the left elbow.  Patient advised to leave the splint in place until he is seen by orthopedics.  Patient was given information for [facility].  Patient and daughter are in agreement with this plan of care and verbalized understanding.  All questions were answered.  Patient stable for discharge.   Final Clinical Impressions(s) /  UC Diagnoses   Final diagnoses:  Closed nondisplaced fracture of styloid process of left radius, initial encounter  Abrasion of left elbow, initial encounter     Discharge Instructions      The x-ray of the right wrist does show a fracture to the styloid bone. Leave the splint in place until you have seen orthopedics. May take over-the-counter Tylenol arthritis strength 650 mg tablet, take 1 tablet every 8 hours as needed for pain or discomfort. RICE therapy, rest, ice,  compression, and elevation. You may leave the dressing in place to the left elbow for 24 hours.  Cleanse the area daily with warm water and an antibacterial soap such as Dial gold bar soap.  Apply topical antibiotic ointment as prescribed.  Keep the area covered when you are out in public, when you are home, may leave the area open to air.  If you have pets in the home, do not allow them to lick the wound. As discussed, would like for you to follow-up with orthopedics on 11/13/2022.  You may follow-up with [facility]. Follow-up as needed.     ED Prescriptions     Medication Sig Dispense Auth. Provider   mupirocin ointment (BACTROBAN) 2 % Apply 1 Application topically 2 (two) times daily. 22 g Davinia Riccardi-Warren, Sadie Haber, NP      PDMP not reviewed this encounter.   Abran Cantor, NP 11/11/22 1400

## 2022-11-11 NOTE — ED Triage Notes (Signed)
Pt reports he fell and injured his left elbow and left wrist x 4 days.     Difficulty with bending wrist backward.  Pt has an open sore on his elbow after falling on concrete.

## 2022-11-11 NOTE — Discharge Instructions (Signed)
The x-ray of the right wrist does show a fracture to the styloid bone. Leave the splint in place until you have seen orthopedics. May take over-the-counter Tylenol arthritis strength 650 mg tablet, take 1 tablet every 8 hours as needed for pain or discomfort. RICE therapy, rest, ice, compression, and elevation. You may leave the dressing in place to the left elbow for 24 hours.  Cleanse the area daily with warm water and an antibacterial soap such as Dial gold bar soap.  Apply topical antibiotic ointment as prescribed.  Keep the area covered when you are out in public, when you are home, may leave the area open to air.  If you have pets in the home, do not allow them to lick the wound. As discussed, would like for you to follow-up with orthopedics on 11/13/2022.  You may follow-up with [facility]. Follow-up as needed.

## 2022-11-13 DIAGNOSIS — M25532 Pain in left wrist: Secondary | ICD-10-CM | POA: Diagnosis not present

## 2022-11-27 DIAGNOSIS — M25532 Pain in left wrist: Secondary | ICD-10-CM | POA: Diagnosis not present

## 2022-12-26 DIAGNOSIS — M25532 Pain in left wrist: Secondary | ICD-10-CM | POA: Diagnosis not present

## 2023-02-06 DIAGNOSIS — Z961 Presence of intraocular lens: Secondary | ICD-10-CM | POA: Diagnosis not present

## 2023-02-06 DIAGNOSIS — H04123 Dry eye syndrome of bilateral lacrimal glands: Secondary | ICD-10-CM | POA: Diagnosis not present

## 2023-02-06 DIAGNOSIS — E119 Type 2 diabetes mellitus without complications: Secondary | ICD-10-CM | POA: Diagnosis not present

## 2023-02-06 DIAGNOSIS — H0102B Squamous blepharitis left eye, upper and lower eyelids: Secondary | ICD-10-CM | POA: Diagnosis not present

## 2023-02-06 DIAGNOSIS — H0102A Squamous blepharitis right eye, upper and lower eyelids: Secondary | ICD-10-CM | POA: Diagnosis not present

## 2023-02-06 DIAGNOSIS — H35411 Lattice degeneration of retina, right eye: Secondary | ICD-10-CM | POA: Diagnosis not present

## 2023-02-16 DIAGNOSIS — R29898 Other symptoms and signs involving the musculoskeletal system: Secondary | ICD-10-CM | POA: Diagnosis not present

## 2023-02-16 DIAGNOSIS — M1990 Unspecified osteoarthritis, unspecified site: Secondary | ICD-10-CM | POA: Diagnosis not present

## 2023-02-16 DIAGNOSIS — N1831 Chronic kidney disease, stage 3a: Secondary | ICD-10-CM | POA: Diagnosis not present

## 2023-02-16 DIAGNOSIS — F439 Reaction to severe stress, unspecified: Secondary | ICD-10-CM | POA: Diagnosis not present

## 2023-02-16 DIAGNOSIS — K5900 Constipation, unspecified: Secondary | ICD-10-CM | POA: Diagnosis not present

## 2023-02-16 DIAGNOSIS — N3281 Overactive bladder: Secondary | ICD-10-CM | POA: Diagnosis not present

## 2023-02-16 DIAGNOSIS — N183 Chronic kidney disease, stage 3 unspecified: Secondary | ICD-10-CM | POA: Diagnosis not present

## 2023-02-16 DIAGNOSIS — Z1389 Encounter for screening for other disorder: Secondary | ICD-10-CM | POA: Diagnosis not present

## 2023-02-16 DIAGNOSIS — Z23 Encounter for immunization: Secondary | ICD-10-CM | POA: Diagnosis not present

## 2023-02-16 DIAGNOSIS — N400 Enlarged prostate without lower urinary tract symptoms: Secondary | ICD-10-CM | POA: Diagnosis not present

## 2023-02-16 DIAGNOSIS — Z0000 Encounter for general adult medical examination without abnormal findings: Secondary | ICD-10-CM | POA: Diagnosis not present

## 2023-02-16 DIAGNOSIS — I1310 Hypertensive heart and chronic kidney disease without heart failure, with stage 1 through stage 4 chronic kidney disease, or unspecified chronic kidney disease: Secondary | ICD-10-CM | POA: Diagnosis not present

## 2023-02-16 DIAGNOSIS — D696 Thrombocytopenia, unspecified: Secondary | ICD-10-CM | POA: Diagnosis not present

## 2023-02-16 DIAGNOSIS — E1169 Type 2 diabetes mellitus with other specified complication: Secondary | ICD-10-CM | POA: Diagnosis not present

## 2023-03-19 DIAGNOSIS — M25552 Pain in left hip: Secondary | ICD-10-CM | POA: Diagnosis not present

## 2023-03-19 DIAGNOSIS — M5416 Radiculopathy, lumbar region: Secondary | ICD-10-CM | POA: Diagnosis not present

## 2023-03-29 DIAGNOSIS — M545 Low back pain, unspecified: Secondary | ICD-10-CM | POA: Diagnosis not present

## 2023-04-16 DIAGNOSIS — M1612 Unilateral primary osteoarthritis, left hip: Secondary | ICD-10-CM | POA: Diagnosis not present

## 2023-04-16 DIAGNOSIS — M5416 Radiculopathy, lumbar region: Secondary | ICD-10-CM | POA: Diagnosis not present

## 2023-10-06 ENCOUNTER — Encounter (HOSPITAL_COMMUNITY): Payer: Self-pay | Admitting: Internal Medicine

## 2023-10-06 ENCOUNTER — Other Ambulatory Visit: Payer: Self-pay

## 2023-10-06 ENCOUNTER — Emergency Department (HOSPITAL_COMMUNITY)

## 2023-10-06 ENCOUNTER — Inpatient Hospital Stay (HOSPITAL_COMMUNITY)

## 2023-10-06 ENCOUNTER — Inpatient Hospital Stay (HOSPITAL_COMMUNITY)
Admission: EM | Admit: 2023-10-06 | Discharge: 2023-10-11 | DRG: 481 | Disposition: A | Discharge status: Skilled Nursing Facility | Attending: Internal Medicine | Admitting: Internal Medicine

## 2023-10-06 ENCOUNTER — Encounter (HOSPITAL_COMMUNITY)
Admission: EM | Disposition: A | Discharge status: Skilled Nursing Facility | Payer: Self-pay | Source: Home / Self Care | Attending: Internal Medicine

## 2023-10-06 DIAGNOSIS — N401 Enlarged prostate with lower urinary tract symptoms: Secondary | ICD-10-CM | POA: Diagnosis present

## 2023-10-06 DIAGNOSIS — Z87891 Personal history of nicotine dependence: Secondary | ICD-10-CM

## 2023-10-06 DIAGNOSIS — R17 Unspecified jaundice: Secondary | ICD-10-CM | POA: Diagnosis present

## 2023-10-06 DIAGNOSIS — Z9079 Acquired absence of other genital organ(s): Secondary | ICD-10-CM

## 2023-10-06 DIAGNOSIS — N390 Urinary tract infection, site not specified: Secondary | ICD-10-CM | POA: Diagnosis present

## 2023-10-06 DIAGNOSIS — N179 Acute kidney failure, unspecified: Secondary | ICD-10-CM | POA: Diagnosis present

## 2023-10-06 DIAGNOSIS — M7989 Other specified soft tissue disorders: Secondary | ICD-10-CM | POA: Diagnosis not present

## 2023-10-06 DIAGNOSIS — S72142A Displaced intertrochanteric fracture of left femur, initial encounter for closed fracture: Secondary | ICD-10-CM

## 2023-10-06 DIAGNOSIS — Z79899 Other long term (current) drug therapy: Secondary | ICD-10-CM | POA: Diagnosis not present

## 2023-10-06 DIAGNOSIS — Z7982 Long term (current) use of aspirin: Secondary | ICD-10-CM | POA: Diagnosis not present

## 2023-10-06 DIAGNOSIS — M6282 Rhabdomyolysis: Secondary | ICD-10-CM | POA: Diagnosis present

## 2023-10-06 DIAGNOSIS — W109XXA Fall (on) (from) unspecified stairs and steps, initial encounter: Secondary | ICD-10-CM | POA: Diagnosis present

## 2023-10-06 DIAGNOSIS — E7800 Pure hypercholesterolemia, unspecified: Secondary | ICD-10-CM | POA: Diagnosis present

## 2023-10-06 DIAGNOSIS — D72829 Elevated white blood cell count, unspecified: Secondary | ICD-10-CM | POA: Diagnosis present

## 2023-10-06 DIAGNOSIS — Z96653 Presence of artificial knee joint, bilateral: Secondary | ICD-10-CM | POA: Diagnosis present

## 2023-10-06 DIAGNOSIS — N138 Other obstructive and reflux uropathy: Secondary | ICD-10-CM | POA: Diagnosis present

## 2023-10-06 DIAGNOSIS — M25552 Pain in left hip: Secondary | ICD-10-CM | POA: Diagnosis present

## 2023-10-06 DIAGNOSIS — G4733 Obstructive sleep apnea (adult) (pediatric): Secondary | ICD-10-CM

## 2023-10-06 DIAGNOSIS — Z751 Person awaiting admission to adequate facility elsewhere: Secondary | ICD-10-CM | POA: Diagnosis not present

## 2023-10-06 DIAGNOSIS — Z7984 Long term (current) use of oral hypoglycemic drugs: Secondary | ICD-10-CM

## 2023-10-06 DIAGNOSIS — W19XXXA Unspecified fall, initial encounter: Secondary | ICD-10-CM | POA: Diagnosis not present

## 2023-10-06 DIAGNOSIS — D62 Acute posthemorrhagic anemia: Secondary | ICD-10-CM | POA: Diagnosis not present

## 2023-10-06 DIAGNOSIS — T796XXA Traumatic ischemia of muscle, initial encounter: Secondary | ICD-10-CM

## 2023-10-06 DIAGNOSIS — N1831 Chronic kidney disease, stage 3a: Secondary | ICD-10-CM | POA: Diagnosis present

## 2023-10-06 DIAGNOSIS — E1122 Type 2 diabetes mellitus with diabetic chronic kidney disease: Secondary | ICD-10-CM | POA: Diagnosis present

## 2023-10-06 DIAGNOSIS — I129 Hypertensive chronic kidney disease with stage 1 through stage 4 chronic kidney disease, or unspecified chronic kidney disease: Secondary | ICD-10-CM | POA: Diagnosis present

## 2023-10-06 DIAGNOSIS — E119 Type 2 diabetes mellitus without complications: Secondary | ICD-10-CM

## 2023-10-06 DIAGNOSIS — I482 Chronic atrial fibrillation, unspecified: Secondary | ICD-10-CM | POA: Diagnosis present

## 2023-10-06 DIAGNOSIS — B952 Enterococcus as the cause of diseases classified elsewhere: Secondary | ICD-10-CM | POA: Diagnosis present

## 2023-10-06 DIAGNOSIS — Z823 Family history of stroke: Secondary | ICD-10-CM

## 2023-10-06 DIAGNOSIS — S72002A Fracture of unspecified part of neck of left femur, initial encounter for closed fracture: Secondary | ICD-10-CM | POA: Diagnosis not present

## 2023-10-06 DIAGNOSIS — E1165 Type 2 diabetes mellitus with hyperglycemia: Secondary | ICD-10-CM | POA: Diagnosis present

## 2023-10-06 DIAGNOSIS — Z8249 Family history of ischemic heart disease and other diseases of the circulatory system: Secondary | ICD-10-CM

## 2023-10-06 DIAGNOSIS — I10 Essential (primary) hypertension: Secondary | ICD-10-CM

## 2023-10-06 HISTORY — PX: INTRAMEDULLARY (IM) NAIL INTERTROCHANTERIC: SHX5875

## 2023-10-06 LAB — CBC WITH DIFFERENTIAL/PLATELET
Abs Immature Granulocytes: 0.07 10*3/uL (ref 0.00–0.07)
Basophils Absolute: 0 10*3/uL (ref 0.0–0.1)
Basophils Relative: 0 %
Eosinophils Absolute: 0 10*3/uL (ref 0.0–0.5)
Eosinophils Relative: 0 %
HCT: 41.8 % (ref 39.0–52.0)
Hemoglobin: 13.8 g/dL (ref 13.0–17.0)
Immature Granulocytes: 0 %
Lymphocytes Relative: 5 %
Lymphs Abs: 0.8 10*3/uL (ref 0.7–4.0)
MCH: 30.7 pg (ref 26.0–34.0)
MCHC: 33 g/dL (ref 30.0–36.0)
MCV: 92.9 fL (ref 80.0–100.0)
Monocytes Absolute: 1.3 10*3/uL — ABNORMAL HIGH (ref 0.1–1.0)
Monocytes Relative: 8 %
Neutro Abs: 14.3 10*3/uL — ABNORMAL HIGH (ref 1.7–7.7)
Neutrophils Relative %: 87 %
Platelets: 196 10*3/uL (ref 150–400)
RBC: 4.5 MIL/uL (ref 4.22–5.81)
RDW: 13 % (ref 11.5–15.5)
WBC: 16.4 10*3/uL — ABNORMAL HIGH (ref 4.0–10.5)
nRBC: 0 % (ref 0.0–0.2)

## 2023-10-06 LAB — COMPREHENSIVE METABOLIC PANEL WITH GFR
ALT: 44 U/L (ref 0–44)
AST: 126 U/L — ABNORMAL HIGH (ref 15–41)
Albumin: 3.4 g/dL — ABNORMAL LOW (ref 3.5–5.0)
Alkaline Phosphatase: 88 U/L (ref 38–126)
Anion gap: 12 (ref 5–15)
BUN: 28 mg/dL — ABNORMAL HIGH (ref 8–23)
CO2: 22 mmol/L (ref 22–32)
Calcium: 8.3 mg/dL — ABNORMAL LOW (ref 8.9–10.3)
Chloride: 104 mmol/L (ref 98–111)
Creatinine, Ser: 1.43 mg/dL — ABNORMAL HIGH (ref 0.61–1.24)
GFR, Estimated: 47 mL/min — ABNORMAL LOW (ref >60)
Glucose, Bld: 251 mg/dL — ABNORMAL HIGH (ref 70–99)
Potassium: 4.5 mmol/L (ref 3.5–5.1)
Sodium: 138 mmol/L (ref 135–145)
Total Bilirubin: 1.4 mg/dL — ABNORMAL HIGH (ref 0.0–1.2)
Total Protein: 6 g/dL — ABNORMAL LOW (ref 6.5–8.1)

## 2023-10-06 LAB — URINALYSIS, W/ REFLEX TO CULTURE (INFECTION SUSPECTED)
Bilirubin Urine: NEGATIVE
Glucose, UA: 50 mg/dL — AB
Ketones, ur: 5 mg/dL — AB
Nitrite: NEGATIVE
Protein, ur: 100 mg/dL — AB
Specific Gravity, Urine: 1.02 (ref 1.005–1.030)
WBC, UA: >50 WBC/hpf (ref 0–5)
pH: 5 (ref 5.0–8.0)

## 2023-10-06 LAB — CK: Total CK: 5820 U/L — ABNORMAL HIGH (ref 49–397)

## 2023-10-06 LAB — GLUCOSE, CAPILLARY: Glucose-Capillary: 186 mg/dL — ABNORMAL HIGH (ref 70–99)

## 2023-10-06 LAB — PROTIME-INR
INR: 1.1 (ref 0.8–1.2)
Prothrombin Time: 14.8 s (ref 11.4–15.2)

## 2023-10-06 LAB — LIPASE, BLOOD: Lipase: 22 U/L (ref 11–51)

## 2023-10-06 LAB — ETHANOL: Alcohol, Ethyl (B): <15 mg/dL (ref <15)

## 2023-10-06 LAB — TROPONIN I (HIGH SENSITIVITY)
Troponin I (High Sensitivity): 52 ng/L — ABNORMAL HIGH (ref <18)
Troponin I (High Sensitivity): 58 ng/L — ABNORMAL HIGH (ref <18)

## 2023-10-06 SURGERY — FIXATION, FRACTURE, INTERTROCHANTERIC, WITH INTRAMEDULLARY ROD
Anesthesia: General | Site: Leg Upper | Laterality: Left

## 2023-10-06 MED ORDER — CEFAZOLIN SODIUM-DEXTROSE 2-3 GM-%(50ML) IV SOLR: INTRAVENOUS | Status: DC | PRN

## 2023-10-06 MED ORDER — DROPERIDOL 2.5 MG/ML IJ SOLN: 0.6250 mg | Freq: Once | INTRAMUSCULAR | Status: DC | PRN

## 2023-10-06 MED ORDER — ACETAMINOPHEN 10 MG/ML IV SOLN
1000.0000 mg | Freq: Once | INTRAVENOUS | Status: DC | PRN
Start: 2023-10-06 — End: 2023-10-07

## 2023-10-06 MED ORDER — IOHEXOL 350 MG/ML SOLN
75.0000 mL | Freq: Once | INTRAVENOUS | Status: AC | PRN
  Administered 2023-10-06: 75 mL via INTRAVENOUS

## 2023-10-06 MED ORDER — SODIUM CHLORIDE 0.9 % IV SOLN: Freq: Once | INTRAVENOUS | Status: AC

## 2023-10-06 MED ORDER — ONDANSETRON HCL 4 MG/2ML IJ SOLN: INTRAMUSCULAR | Status: DC | PRN

## 2023-10-06 MED ORDER — SODIUM CHLORIDE 0.9 % IV SOLN
INTRAVENOUS | Status: DC | PRN
Start: 2023-10-06 — End: 2023-10-06

## 2023-10-06 MED ORDER — FENTANYL CITRATE (PF) 250 MCG/5ML IJ SOLN: INTRAMUSCULAR | Status: DC | PRN

## 2023-10-06 MED ORDER — ROCURONIUM BROMIDE 100 MG/10ML IV SOLN: INTRAVENOUS | Status: DC | PRN

## 2023-10-06 MED ORDER — OXYCODONE HCL 5 MG/5ML PO SOLN: 5.0000 mg | Freq: Once | ORAL | Status: DC | PRN

## 2023-10-06 MED ORDER — SUGAMMADEX SODIUM 200 MG/2ML IV SOLN: INTRAVENOUS | Status: DC | PRN

## 2023-10-06 MED ORDER — CEFAZOLIN SODIUM-DEXTROSE 2-4 GM/100ML-% IV SOLN
INTRAVENOUS | Status: AC
Start: 2023-10-06 — End: 2023-10-06
  Filled 2023-10-06: qty 100

## 2023-10-06 MED ORDER — INSULIN ASPART 100 UNIT/ML IJ SOLN: 0.0000 [IU] | Freq: Every day | INTRAMUSCULAR | Status: DC

## 2023-10-06 MED ORDER — OXYCODONE HCL 5 MG PO TABS: 5.0000 mg | ORAL_TABLET | Freq: Once | ORAL | Status: DC | PRN

## 2023-10-06 MED ORDER — ESMOLOL HCL 100 MG/10ML IV SOLN
INTRAVENOUS | Status: DC | PRN
Start: 2023-10-06 — End: 2023-10-06

## 2023-10-06 MED ORDER — FENTANYL CITRATE (PF) 100 MCG/2ML IJ SOLN: 25.0000 ug | INTRAMUSCULAR | Status: DC | PRN

## 2023-10-06 MED ORDER — FENTANYL CITRATE (PF) 250 MCG/5ML IJ SOLN
INTRAMUSCULAR | Status: AC
Start: 2023-10-06 — End: 2023-10-06
  Filled 2023-10-06: qty 5

## 2023-10-06 MED ORDER — 0.9 % SODIUM CHLORIDE (POUR BTL) OPTIME
TOPICAL | Status: DC | PRN
  Administered 2023-10-06: 1000 mL

## 2023-10-06 MED ORDER — PROPOFOL 10 MG/ML IV BOLUS
INTRAVENOUS | Status: AC
  Filled 2023-10-06: qty 20

## 2023-10-06 MED ORDER — ACETAMINOPHEN 10 MG/ML IV SOLN: INTRAVENOUS | Status: DC | PRN

## 2023-10-06 MED ORDER — SODIUM CHLORIDE 0.9 % IV BOLUS
500.0000 mL | Freq: Once | INTRAVENOUS | Status: AC
  Administered 2023-10-06: 500 mL via INTRAVENOUS

## 2023-10-06 MED ORDER — LORAZEPAM 2 MG/ML IJ SOLN
0.5000 mg | Freq: Once | INTRAMUSCULAR | Status: AC
  Administered 2023-10-06: 0.5 mg via INTRAVENOUS
  Filled 2023-10-06: qty 1

## 2023-10-06 MED ORDER — FENTANYL CITRATE PF 50 MCG/ML IJ SOSY
25.0000 ug | PREFILLED_SYRINGE | INTRAMUSCULAR | Status: DC | PRN
  Administered 2023-10-06: 25 ug via INTRAVENOUS
  Filled 2023-10-06: qty 1

## 2023-10-06 MED ORDER — PHENYLEPHRINE HCL-NACL 20-0.9 MG/250ML-% IV SOLN: INTRAVENOUS | Status: DC | PRN

## 2023-10-06 MED ORDER — DEXAMETHASONE SODIUM PHOSPHATE 4 MG/ML IJ SOLN: INTRAMUSCULAR | Status: DC | PRN

## 2023-10-06 MED ORDER — LIDOCAINE HCL (CARDIAC) PF 50 MG/5ML IV SOSY: PREFILLED_SYRINGE | INTRAVENOUS | Status: DC | PRN

## 2023-10-06 MED ORDER — ACETAMINOPHEN 10 MG/ML IV SOLN
INTRAVENOUS | Status: AC
Start: 2023-10-06 — End: 2023-10-06
  Filled 2023-10-06: qty 100

## 2023-10-06 MED ORDER — PHENYLEPHRINE 80 MCG/ML (10ML) SYRINGE FOR IV PUSH (FOR BLOOD PRESSURE SUPPORT): PREFILLED_SYRINGE | INTRAVENOUS | Status: DC | PRN

## 2023-10-06 MED ORDER — INSULIN ASPART 100 UNIT/ML IJ SOLN
0.0000 [IU] | Freq: Three times a day (TID) | INTRAMUSCULAR | Status: DC
  Administered 2023-10-07: 3 [IU] via SUBCUTANEOUS
  Administered 2023-10-07: 2 [IU] via SUBCUTANEOUS
  Administered 2023-10-07: 5 [IU] via SUBCUTANEOUS
  Administered 2023-10-08: 3 [IU] via SUBCUTANEOUS
  Administered 2023-10-08: 2 [IU] via SUBCUTANEOUS
  Administered 2023-10-08: 3 [IU] via SUBCUTANEOUS
  Administered 2023-10-09: 2 [IU] via SUBCUTANEOUS
  Administered 2023-10-09: 5 [IU] via SUBCUTANEOUS
  Administered 2023-10-09 – 2023-10-10 (×2): 3 [IU] via SUBCUTANEOUS
  Administered 2023-10-10: 5 [IU] via SUBCUTANEOUS
  Administered 2023-10-10: 2 [IU] via SUBCUTANEOUS
  Administered 2023-10-11: 3 [IU] via SUBCUTANEOUS

## 2023-10-06 MED ORDER — PROPOFOL 10 MG/ML IV BOLUS: INTRAVENOUS | Status: DC | PRN

## 2023-10-06 SURGICAL SUPPLY — 44 items
BAG COUNTER SPONGE SURGICOUNT (BAG) ×1 IMPLANT
BIT DRILL 4.0X280 (BIT) IMPLANT
BLADE SURG 10 STRL SS (BLADE) ×2 IMPLANT
BNDG COHESIVE 6X5 TAN ST LF (GAUZE/BANDAGES/DRESSINGS) ×2 IMPLANT
CHLORAPREP W/TINT 26 (MISCELLANEOUS) ×1 IMPLANT
COVER PERINEAL POST (MISCELLANEOUS) ×1 IMPLANT
COVER SURGICAL LIGHT HANDLE (MISCELLANEOUS) ×1 IMPLANT
DRAPE C-ARM 35X43 STRL (DRAPES) ×1 IMPLANT
DRAPE C-ARMOR (DRAPES) ×1 IMPLANT
DRAPE HALF SHEET 40X57 (DRAPES) ×2 IMPLANT
DRAPE IMP U-DRAPE 54X76 (DRAPES) ×2 IMPLANT
DRAPE INCISE IOBAN 66X45 STRL (DRAPES) ×1 IMPLANT
DRAPE STERI IOBAN 125X83 (DRAPES) ×1 IMPLANT
DRAPE SURG 17X23 STRL (DRAPES) ×1 IMPLANT
DRAPE U-SHAPE 47X51 STRL (DRAPES) ×1 IMPLANT
DRESSING MEPILEX FLEX 4X4 (GAUZE/BANDAGES/DRESSINGS) ×3 IMPLANT
DRSG MEPILEX POST OP 4X8 (GAUZE/BANDAGES/DRESSINGS) ×1 IMPLANT
ELECTRODE REM PT RTRN 9FT ADLT (ELECTROSURGICAL) ×1 IMPLANT
GAUZE PAD ABD 8X10 STRL (GAUZE/BANDAGES/DRESSINGS) IMPLANT
GLOVE BIO SURGEON STRL SZ7.5 (GLOVE) ×1 IMPLANT
GLOVE BIOGEL M STRL SZ7.5 (GLOVE) ×1 IMPLANT
GLOVE BIOGEL PI IND STRL 8 (GLOVE) ×2 IMPLANT
GOWN STRL REUS W/ TWL LRG LVL3 (GOWN DISPOSABLE) ×1 IMPLANT
GOWN STRL REUS W/ TWL XL LVL3 (GOWN DISPOSABLE) ×1 IMPLANT
GUIDEWIRE ORTH 900X3XBALL NOSE (WIRE) IMPLANT
KIT BASIN OR (CUSTOM PROCEDURE TRAY) ×1 IMPLANT
KIT TURNOVER KIT B (KITS) ×1 IMPLANT
NAIL LT 10X39X130 TROCHANTERIC (Screw) IMPLANT
NS IRRIG 1000ML POUR BTL (IV SOLUTION) ×1 IMPLANT
PACK GENERAL/GYN (CUSTOM PROCEDURE TRAY) ×1 IMPLANT
PAD ARMBOARD POSITIONER FOAM (MISCELLANEOUS) ×2 IMPLANT
PIN GUIDE THRD AR 3.2X330 (PIN) IMPLANT
SCREW CORT CAPTR 5X42 (Screw) IMPLANT
SCREW TELESCOP LAG LT 10.5X110 (Screw) IMPLANT
STAPLER SKIN PROX 35W (STAPLE) ×1 IMPLANT
SUT MNCRL AB 3-0 PS2 27 (SUTURE) IMPLANT
SUT MON AB 3-0 SH27 (SUTURE) ×1 IMPLANT
SUT PDS AB 2-0 CT1 27 (SUTURE) ×1 IMPLANT
SUT VIC AB 2-0 CT1 TAPERPNT 27 (SUTURE) IMPLANT
TAPE CLOTH SOFT 2X10 (GAUZE/BANDAGES/DRESSINGS) IMPLANT
TOOL ACTIVATION (INSTRUMENTS) IMPLANT
TOWEL GREEN STERILE (TOWEL DISPOSABLE) ×2 IMPLANT
TOWEL GREEN STERILE FF (TOWEL DISPOSABLE) ×1 IMPLANT
WATER STERILE IRR 1000ML POUR (IV SOLUTION) ×1 IMPLANT

## 2023-10-06 NOTE — Anesthesia Preprocedure Evaluation (Addendum)
 Anesthesia Evaluation  Patient identified by MRN, date of birth, ID band Patient awake    Reviewed: Allergy & Precautions, H&P , NPO status , Patient's Chart, lab work & pertinent test results  History of Anesthesia Complications (+) history of anesthetic complications (hx of post op delirium)  Airway Mallampati: II  TM Distance: >3 FB Neck ROM: Full    Dental no notable dental hx.    Pulmonary sleep apnea , former smoker   Pulmonary exam normal breath sounds clear to auscultation       Cardiovascular hypertension, (-) Past MI Normal cardiovascular exam+ dysrhythmias Atrial Fibrillation  Rhythm:Irregular Rate:Normal  TTE Echocardiogram 09/11/2019:  Normal LV systolic function with visual EF 55-60%. Left ventricle cavity  is normal in size. Normal global wall motion. Moderate left ventricular  hypertrophy. Doppler evidence of grade I diastolic dysfunction, elevated  LAP. Calculated EF 55%.  Left atrial cavity is mildly dilated.  Structurally normal trileaflet aortic valve. No evidence of aortic  stenosis. Mild (Grade I) aortic regurgitation.  Mild calcification of the mitral valve annulus, mild mitral valve leaflet  thickening, and normal mitral valve leaflet mobility. No evidence of  mitral stenosis. Mild (Grade I) mitral regurgitation.  Mild tricuspid regurgitation.  The aortic root is dilated.  No prior study for comparison.     Neuro/Psych   Anxiety    Dementia Hx of subdural hematoma      GI/Hepatic Neg liver ROS, hiatal hernia,,,  Endo/Other  diabetes, Type 2    Renal/GU negative Renal ROS  negative genitourinary   Musculoskeletal  (+) Arthritis ,   left intertrochanteric hip fracture following fall. mild rhabdomyolysis   Abdominal   Peds negative pediatric ROS (+)  Hematology negative hematology ROS (+)   Anesthesia Other Findings   Reproductive/Obstetrics negative OB ROS                              Anesthesia Physical Anesthesia Plan  ASA: 3  Anesthesia Plan: General   Post-op Pain Management: Ofirmev  IV (intra-op)*   Induction: Intravenous and Rapid sequence  PONV Risk Score and Plan: 2 and Ondansetron , Dexamethasone  and Treatment may vary due to age or medical condition  Airway Management Planned: Oral ETT  Additional Equipment: None  Intra-op Plan:   Post-operative Plan: Extubation in OR  Informed Consent: I have reviewed the patients History and Physical, chart, labs and discussed the procedure including the risks, benefits and alternatives for the proposed anesthesia with the patient or authorized representative who has indicated his/her understanding and acceptance.     Dental advisory given  Plan Discussed with: CRNA  Anesthesia Plan Comments: [name] is a 89 y.o. male with past history of A-fib (not anticoagulated), hypertension, hyperlipidemia, diabetes)        Anesthesia Quick Evaluation

## 2023-10-06 NOTE — ED Notes (Signed)
 C collar removed by MD long at bedside.

## 2023-10-06 NOTE — Anesthesia Procedure Notes (Signed)
 Procedure Name: Intubation Date/Time: 10/06/2023 9:32 PM  Performed by: Alvia Awkward, CRNAPre-anesthesia Checklist: Patient identified, Emergency Drugs available, Suction available, Patient being monitored and Timeout performed Patient Re-evaluated:Patient Re-evaluated prior to induction Oxygen  Delivery Method: Circle system utilized Preoxygenation: Pre-oxygenation with 100% oxygen  Induction Type: IV induction Ventilation: Mask ventilation without difficulty Laryngoscope Size: Glidescope and 3 Grade View: Grade I Tube type: Oral Tube size: 7.5 mm Number of attempts: 1 Airway Equipment and Method: Stylet and Video-laryngoscopy Placement Confirmation: ETT inserted through vocal cords under direct vision, positive ETCO2 and breath sounds checked- equal and bilateral Secured at: 24 cm Tube secured with: Tape Dental Injury: Teeth and Oropharynx as per pre-operative assessment

## 2023-10-06 NOTE — Consult Note (Addendum)
 Brief orthopedic consult note:  Called by EDP for this patient.  Status post fall at home with left intertrochanteric hip fracture.  Patient was down for a while and has elevated CK.  ED consulting medicine for admission.  Patient is indicated for surgery for his left hip fracture.    Plan for surgery ASAP.  Keep NPO.  Bedrest for now.

## 2023-10-06 NOTE — Op Note (Signed)
 [name] male 89 y.o. 10/06/2023  PreOperative Diagnosis: Left intertochanteric hip fracture  PostOperative Diagnosis: same  PROCEDURE: Cephalomedullary nail of left hip fracture  SURGEON: Lasandra Points, MD  ASSISTANT: Jesse [place], PA-C was necessary for patient positioning, prep, drape, assistance with fracture reduction and placement of hardware.  ANESTHESIA: General endotracheal tube anesthesia  FINDINGS: Displaced intertrochanteric hip fracture  IMPLANTS: Arthrex intertrochanteric long ES hip nail  INDICATIONS:89 y.o. malehad a fall and sustained an intertrochanteric hip fracture.  She was indicated for surgery due to baseline ambulatory status and significant pain due to the fracture.  Discussion was had with the patient about the surgery and they wish to proceed.  Patient understood the risks, benefits and alternatives to surgery which include but are not limited to wound healing complications, infection, nonunion, malunion, need for further surgery as well as damage to surrounding structures. They also understood the potential for continued pain in that there were no guarantees of acceptable outcome After weighing these risks the patient opted to proceed with surgery.  PROCEDURE: Patient was identified in the preoperative holding area and the left hip was marked by myself.  The consent was signed by myself and the patient.  The patient was taken to the operating room where general endotracheal anesthesia was induced without difficulty and then was moved supine on a Hana table.  The operative leg was placed into the traction boot.  The non-operative leg was well-padded and affixed to the right leg holder with coban.  The arm was crossed over the chest and well padded.  Preoperative antibiotics was given.  Surgical timeout was performed.    Using fluoroscopy appropriate AP and lateral x-rays of the hip were obtained.  Then using traction through the Hana table was able  to reduce the fracture fragments in a closed fashion.  Acceptable reduction was confirmed on AP and lateral x-rays.  Then the left hip was prepped and draped in usual sterile fashion.  A shower curtain drape was placed.  We began the surgery by placing the guidepin percutaneously at the appropriate starting point at the tip of the greater trochanter.  The appropriate starting point was confirmed on AP and lateral radiograph.  Then a 3 cm incision was made about the site of the percutaneous placement of the pin and the opening reamer with a soft tissue guide was placed into the wound down to the tip of the greater trochanter and the bone was entered with the opening reamer down to the level of lesser trochanter.  Then the openning reamer and guide pin were removed and a guidewire was placed down to the center center portion of the femur.  He did have a total knee in place.  Then the nail was measured.  We decided on a 39 cm nail.  We then placed the nail over the guidewire without difficulty.  The implant placed without difficulty and appropriate positioning was confirmed on fluoroscopy.  Then a stab incision was made for guidepin placement for the cephalomedullary screw.  Guidepin was placed in the appropriate position and the length was measured.  Care was taken to place the pin and the acceptable position within the femoral head.  Then cannulated drill was used to drill for the screw placement.  Screw was placed without difficulty.  We used a reverse threaded screw due to it being a left hip fracture then the screw was then locked within the nail.  Appropriate position was confirmed fluoroscopically.  Then the distal interlock  screw was placed without difficulty through the guide.  Final films were taken.  The wounds were irrigated with normal saline and the incisions were closed in a layered fashion using 2-0 monocryl and staples.  Mepilex dressings were used.  Patient was then transferred to the hospital bed  and awakened from anesthesia without difficulty.  Counts were correct at the end the case.  There were no complications.  POST OPERATIVE INSTRUCTIONS: WBAT operative extremity Patient will be discharged on 325 mg aspirin  for DVT prophylaxis if not already on DVT prophylaxis at baseline Follow-up in 2 weeks for x-rays of the operative hip and suture removal if appropriate    BLOOD LOSS:  200 mL         DRAINS: none         SPECIMEN: none       COMPLICATIONS:  * No complications entered in OR log *         Disposition: PACU - hemodynamically stable.         Condition: stable

## 2023-10-06 NOTE — ED Notes (Signed)
 Pt taken upstairs to the PACU.

## 2023-10-06 NOTE — Consult Note (Signed)
 Reason for Consult: Left intertrochanteric hip fracture after fall Referring Physician: Emergency department  [name] is an 89 y.o. male.  HPI: Patient was found down after a fall in his garage.  He is ambulatory at baseline.  X-rays in the emergency department revealed a left intertrochanteric hip fracture.  He does have a history of left total knee arthroplasty and x-rays appeared stable.  In the ER he was slightly disoriented.  Family is at bedside.  Patient complained of pain in the left hip. Past Medical History:  Diagnosis Date   Allergic rhinitis    Anxiety    Arthritis    Benign localized prostatic hyperplasia with lower urinary tract symptoms (LUTS)    Chronic back pain    treated with epidural injections   Complication of anesthesia    delirium with infection post op back surgery 04-01-2008    H/O hiatal hernia    History of cardiac arrhythmia    event monitor 01-18-2016 in epic (per pt heart rate drops),  showed SR, SB, rare episodes intercalated PVCs with no evidence high grade ectopy or atrial fib   Hyperlipidemia    Hypertension    followed by pcp   Sciatica of right side    Type 2 diabetes mellitus (HCC)    followed by pcp  (03-04-2019  check's cbg 3 times per week,  fasting cbg 109-140)   Upper airway resistance syndrome    per cardiologist , dr Loetta Ringer, note in [facility] 11-13-2018  dx from study done 2017 in epic    Urethral stricture     Past Surgical History:  Procedure Laterality Date   APPENDECTOMY  age 40   CATARACT EXTRACTION W/ INTRAOCULAR LENS  IMPLANT, BILATERAL Bilateral 2017; 2018   CLOSED REDUCTION GREATER HUMERAL TUBEROSITY FRACTURE Left 06-26-2009   @[facility]    KNEE ARTHROSCOPY Bilateral right 11/ 2007;  left 2012   LAPAROSCOPIC NISSEN FUNDOPLICATION  11-07-2000   dr Gaylyn Keas @[facility]    LUMBAR SPINE SURGERY  x4  last one 04-01-2008 dr Rochelle Chu @[facility]    this including ORIF L4 compression fracture and fusion L3-5   NASAL SEPTOPLASTY W/ TURBINOPLASTY  2000   SHOULDER  ARTHROSCOPY Left 07-20-2009   dr Agatha Horsfall @[facility]    ORIF of greater tuberosity and debridement   STRABISMUS SURGERY Left 09/11/2014   Procedure: REPAIR STRABISMUS LEFT EYE;  Surgeon: Dorothey Gate, MD;  Location: [facility];  Service: Ophthalmology;  Laterality: Left;   TONSILLECTOMY AND ADENOIDECTOMY  age 80   TOTAL KNEE ARTHROPLASTY Right 05/13/2007    dr Jinger Mount @[facility]    TOTAL KNEE ARTHROPLASTY Left 08/05/2012   Procedure: TOTAL KNEE ARTHROPLASTY;  Surgeon: Genevie Kerns, MD;  Location: [facility] OR;  Service: Orthopedics;  Laterality: Left;   TRANSURETHRAL RESECTION OF PROSTATE N/A 03/05/2019   Procedure: TRANSURETHRAL RESECTION OF THE PROSTATE (TURP), BIPOLAR/ URETHRAL DILATION;  Surgeon: Adelbert Homans, MD;  Location: [facility];  Service: Urology;  Laterality: N/A;    Family History  Problem Relation Age of Onset   Heart attack Father    Stroke Maternal Grandfather    Cancer Other    Stroke Mother    COPD Sister    Other Brother        accident   Heart failure Sister    COPD Brother     Social History:  reports that he quit smoking about 40 years ago. His smoking use included cigarettes. He started smoking about 65 years ago. He quit smokeless tobacco use about  37 years ago.  His smokeless tobacco use included chew. He reports that he does not drink alcohol and does not use drugs.  Allergies: No Known Allergies  Medications: I have reviewed the patient's current medications.  Results for orders placed or performed during the hospital encounter of 10/06/23 (from the past 48 hours)  Comprehensive metabolic panel     Status: Abnormal   Collection Time: 10/06/23  3:25 PM  Result Value Ref Range   Sodium 138 135 - 145 mmol/L   Potassium 4.5 3.5 - 5.1 mmol/L   Chloride 104 98 - 111 mmol/L   CO2 22 22 - 32 mmol/L   Glucose, Bld 251 (H) 70 - 99 mg/dL    Comment: Glucose reference range applies only to samples taken after fasting for at least 8 hours.    BUN 28 (H) 8 - 23 mg/dL   Creatinine, Ser 7.82 (H) 0.61 - 1.24 mg/dL   Calcium 8.3 (L) 8.9 - 10.3 mg/dL   Total Protein 6.0 (L) 6.5 - 8.1 g/dL   Albumin 3.4 (L) 3.5 - 5.0 g/dL   AST 956 (H) 15 - 41 U/L   ALT 44 0 - 44 U/L   Alkaline Phosphatase 88 38 - 126 U/L   Total Bilirubin 1.4 (H) 0.0 - 1.2 mg/dL   GFR, Estimated 47 (L) >60 mL/min    Comment: (NOTE) Calculated using the CKD-EPI Creatinine Equation (2021)    Anion gap 12 5 - 15    Comment: Performed at [facility], [address], [place], [place] [address]  Ethanol     Status: None   Collection Time: 10/06/23  3:25 PM  Result Value Ref Range   Alcohol, Ethyl (B) <15 <15 mg/dL    Comment: (NOTE) For medical purposes only. Performed at [facility], [address], [place], [place] [address]   Troponin I (High Sensitivity)     Status: Abnormal   Collection Time: 10/06/23  3:25 PM  Result Value Ref Range   Troponin I (High Sensitivity) 52 (H) <18 ng/L    Comment: (NOTE) Elevated high sensitivity troponin I (hsTnI) values and significant  changes across serial measurements may suggest ACS but many other  chronic and acute conditions are known to elevate hsTnI results.  Refer to the Links section for chest pain algorithms and additional  guidance. Performed at [facility], [address] [place], [place] [address]   CBC with Differential     Status: Abnormal   Collection Time: 10/06/23  3:25 PM  Result Value Ref Range   WBC 16.4 (H) 4.0 - 10.5 K/uL   RBC 4.50 4.22 - 5.81 MIL/uL   Hemoglobin 13.8 13.0 - 17.0 g/dL   HCT 52.8 41.3 - 24.4 %   MCV 92.9 80.0 - 100.0 fL   MCH 30.7 26.0 - 34.0 pg   MCHC 33.0 30.0 - 36.0 g/dL   RDW 01.0 27.2 - 53.6 %   Platelets 196 150 - 400 K/uL   nRBC 0.0 0.0 - 0.2 %   Neutrophils Relative % 87 %   Neutro Abs 14.3 (H) 1.7 - 7.7 K/uL   Lymphocytes Relative 5 %   Lymphs Abs 0.8 0.7 - 4.0 K/uL   Monocytes Relative 8 %   Monocytes Absolute 1.3 (H) 0.1 - 1.0 K/uL    Eosinophils Relative 0 %   Eosinophils Absolute 0.0 0.0 - 0.5 K/uL   Basophils Relative 0 %   Basophils Absolute 0.0 0.0 -  0.1 K/uL   Immature Granulocytes 0 %   Abs Immature Granulocytes 0.07 0.00 - 0.07 K/uL    Comment: Performed at [facility], [address] [place], [place] [address]  Protime-INR     Status: None   Collection Time: 10/06/23  3:25 PM  Result Value Ref Range   Prothrombin Time 14.8 11.4 - 15.2 seconds   INR 1.1 0.8 - 1.2    Comment: (NOTE) INR goal varies based on device and disease states. Performed at [facility], [address] [place], [place] [address]   CK     Status: Abnormal   Collection Time: 10/06/23  3:25 PM  Result Value Ref Range   Total CK 5,820 (H) 49 - 397 U/L    Comment: RESULT CONFIRMED BY MANUAL DILUTION Performed at [facility], [address], [place], [place] [address]   Lipase, blood     Status: None   Collection Time: 10/06/23  3:25 PM  Result Value Ref Range   Lipase 22 11 - 51 U/L    Comment: Performed at [facility], [address] [place], [place] [address]  Troponin I (High Sensitivity)     Status: Abnormal   Collection Time: 10/06/23  5:25 PM  Result Value Ref Range   Troponin I (High Sensitivity) 58 (H) <18 ng/L    Comment: (NOTE) Elevated high sensitivity troponin I (hsTnI) values and significant  changes across serial measurements may suggest ACS but many other  chronic and acute conditions are known to elevate hsTnI results.  Refer to the Links section for chest pain algorithms and additional  guidance. Performed at [facility], [address], [place], [place] [address]   Urinalysis, w/ Reflex to Culture (Infection Suspected) -Urine, Clean Catch     Status: Abnormal   Collection Time: 10/06/23  7:05 PM  Result Value Ref Range   Specimen Source URINE, CLEAN CATCH    Color, Urine YELLOW YELLOW   APPearance HAZY (A) CLEAR   Specific Gravity, Urine 1.020 1.005 - 1.030   pH 5.0  5.0 - 8.0   Glucose, UA 50 (A) NEGATIVE mg/dL   Hgb urine dipstick MODERATE (A) NEGATIVE   Bilirubin Urine NEGATIVE NEGATIVE   Ketones, ur 5 (A) NEGATIVE mg/dL   Protein, ur 657 (A) NEGATIVE mg/dL   Nitrite NEGATIVE NEGATIVE   Leukocytes,Ua SMALL (A) NEGATIVE   RBC / HPF 0-5 0 - 5 RBC/hpf   WBC, UA >50 0 - 5 WBC/hpf    Comment:        Reflex urine culture not performed if WBC <=10, OR if Squamous epithelial cells >5. If Squamous epithelial cells >5 suggest recollection.    Bacteria, UA RARE (A) NONE SEEN   Squamous Epithelial / HPF 0-5 0 - 5 /HPF   Mucus PRESENT    Hyaline Casts, UA PRESENT     Comment: Performed at [facility], [address], [place], [place] [address]  Urine Culture     Status: None (Preliminary result)   Collection Time: 10/06/23  7:05 PM   Specimen: Urine, Random  Result Value Ref Range   Specimen Description URINE, RANDOM    Special Requests      URINE, CLEAN CATCH Performed at [facility], [address] [place], [place] [address]    Culture PENDING    Report Status PENDING     CT Head Wo Contrast Result Date: 10/06/2023 CLINICAL DATA:  Neck trauma (Age >= 65y); Head trauma, minor (Age >= 65y) EXAM: CT HEAD WITHOUT CONTRAST CT CERVICAL SPINE WITHOUT CONTRAST TECHNIQUE: Multidetector CT imaging of the head and cervical spine was performed following the standard protocol without intravenous contrast. Multiplanar CT image reconstructions of the cervical spine were also generated. RADIATION DOSE REDUCTION: This exam was performed according to the departmental dose-optimization program which includes automated exposure control, adjustment of the mA and/or kV according to patient size and/or use of iterative reconstruction technique. COMPARISON:  None Available. FINDINGS: CT HEAD FINDINGS Brain: Patchy and confluent areas of decreased attenuation are noted throughout the deep and periventricular white matter of the cerebral hemispheres bilaterally,  compatible with chronic microvascular ischemic disease. No evidence of large-territorial acute infarction. No parenchymal hemorrhage. No mass lesion. No extra-axial collection. No mass effect or midline shift. No hydrocephalus. Basilar cisterns are patent. Vascular: No hyperdense vessel. Atherosclerotic calcifications are present within the cavernous internal carotid and vertebral arteries. Skull: No acute fracture or focal lesion. Sinuses/Orbits: Paranasal sinuses and mastoid air cells are clear. Bilateral lens replacement. Otherwise the orbits are unremarkable. Other: None. CT CERVICAL SPINE FINDINGS Alignment: Normal. Skull base and vertebrae: Multilevel moderate degenerative change of the spine. Associated multilevel severe osseous neural foraminal stenosis. No severe osseous central canal stenosis. No acute fracture. No aggressive appearing focal osseous lesion or focal pathologic process. Soft tissues and spinal canal: No prevertebral fluid or swelling. No visible canal hematoma. Upper chest: Unremarkable. Other: None. IMPRESSION: 1. No acute intracranial abnormality. 2. No acute displaced fracture or traumatic listhesis of the cervical spine. Electronically Signed   By: Morgane  Naveau M.D.   On: 10/06/2023 18:32   CT Cervical Spine Wo Contrast Result Date: 10/06/2023 CLINICAL DATA:  Neck trauma (Age >= 65y); Head trauma, minor (Age >= 65y) EXAM: CT HEAD WITHOUT CONTRAST CT CERVICAL SPINE WITHOUT CONTRAST TECHNIQUE: Multidetector CT imaging of the head and cervical spine was performed following the standard protocol without intravenous contrast. Multiplanar CT image reconstructions of the cervical spine were also generated. RADIATION DOSE REDUCTION: This exam was performed according to the departmental dose-optimization program which includes automated exposure control, adjustment of the mA and/or kV according to patient size and/or use of iterative reconstruction technique. COMPARISON:  None Available.  FINDINGS: CT HEAD FINDINGS Brain: Patchy and confluent areas of decreased attenuation are noted throughout the deep and periventricular white matter of the cerebral hemispheres bilaterally, compatible with chronic microvascular ischemic disease. No evidence of large-territorial acute infarction. No parenchymal hemorrhage. No mass lesion. No extra-axial collection. No mass effect or midline shift. No hydrocephalus. Basilar cisterns are patent. Vascular: No hyperdense vessel. Atherosclerotic calcifications are present within the cavernous internal carotid and vertebral arteries. Skull: No acute fracture or focal lesion. Sinuses/Orbits: Paranasal sinuses and mastoid air cells are clear. Bilateral lens replacement. Otherwise the orbits are unremarkable. Other: None. CT CERVICAL SPINE FINDINGS Alignment: Normal. Skull base and vertebrae: Multilevel moderate degenerative change of the spine. Associated multilevel severe osseous neural foraminal stenosis. No severe osseous central canal stenosis. No acute fracture. No aggressive appearing focal osseous lesion or focal pathologic process. Soft tissues and spinal canal: No prevertebral fluid or swelling. No visible canal hematoma. Upper chest: Unremarkable. Other: None. IMPRESSION: 1. No acute intracranial abnormality. 2. No acute displaced fracture or traumatic listhesis of the cervical spine. Electronically Signed   By: Morgane  Naveau M.D.   On: 10/06/2023 18:32   CT CHEST ABDOMEN PELVIS W CONTRAST Result Date: 10/06/2023 CLINICAL DATA:  Polytrauma, blunt.  Fall EXAM: CT CHEST, ABDOMEN, AND PELVIS WITH CONTRAST TECHNIQUE: Multidetector CT imaging of the chest, abdomen and pelvis was performed following the standard protocol during bolus administration of intravenous contrast. RADIATION DOSE REDUCTION: This exam was performed according to the departmental dose-optimization program which includes automated exposure control, adjustment of the mA and/or kV according to  patient size and/or use of iterative reconstruction technique. CONTRAST:  75mL OMNIPAQUE IOHEXOL 350 MG/ML SOLN COMPARISON:  Chest x-ray 12/26/2015 FINDINGS: CHEST: Cardiovascular: No aortic injury. The thoracic aorta is normal in caliber. The heart is normal in size. No significant pericardial effusion. Severe atherosclerotic plaque. At least 3 vessel coronary calcification. Mediastinum/Nodes: No pneumomediastinum. No mediastinal hematoma. The esophagus is unremarkable.  Small hiatal hernia. The thyroid  is unremarkable. The central airways are patent. No mediastinal, hilar, or axillary lymphadenopathy. Lungs/Pleura: Severe emphysematous changes more prominent along the upper lung zones. No focal consolidation. No pulmonary nodule. No pulmonary mass. No pulmonary contusion or laceration. No pneumatocele formation. No pleural effusion. No pneumothorax. No hemothorax. Musculoskeletal/Chest wall: No chest wall mass. Prior screw fixation of the left humeral head with fracture of 1 of the screws (4:14). No acute rib or sternal fracture. No spinal fracture. ABDOMEN / PELVIS: Hepatobiliary: Not enlarged. No focal lesion. No laceration or subcapsular hematoma. The gallbladder is otherwise unremarkable with no radio-opaque gallstones. No biliary ductal dilatation. Pancreas: Normal pancreatic contour. No main pancreatic duct dilatation. Spleen: Not enlarged. No focal lesion. No laceration, subcapsular hematoma, or vascular injury. Adrenals/Urinary Tract: No nodularity bilaterally. Bilateral kidneys enhance symmetrically. No hydronephrosis. No contusion, laceration, or subcapsular hematoma. Fluid is lesions of the kidneys likely represent simple renal cysts. Simple renal cysts, in the absence of clinically indicated signs/symptoms, require no independent follow-up. No injury to the vascular structures or collecting systems. No hydroureter. The urinary bladder is unremarkable. On delayed imaging, there is no urothelial wall  thickening and there are no filling defects in the opacified portions of the bilateral collecting systems or ureters. Stomach/Bowel: No small or large bowel wall thickening or dilatation. The appendix is unremarkable. Vasculature/Lymphatics: Severe atherosclerotic plaque. No abdominal aorta or iliac aneurysm. No active contrast extravasation or pseudoaneurysm. No abdominal, pelvic, inguinal lymphadenopathy. Reproductive: Prostate is unremarkable. Other: No simple free fluid ascites. No pneumoperitoneum. No hemoperitoneum. No mesenteric hematoma identified. No organized fluid collection. Musculoskeletal: No significant soft tissue hematoma. Enlarged and heterogeneous asymmetric left adductor musculature compartment consistent with hematoma with underlying active extravasation not excluded given with the hyperdensity extending from the fracture site and adjacent vasculature. Left hip acute displaced and comminuted intertrochanteric fracture. L3-L5 posterolateral surgical hardware fusion. Chronic L4 fracture. No spinal fracture. Other ports and devices: None. IMPRESSION: 1. Left hip acute displaced and comminuted intertrochanteric fracture. 2. Associated left adductor musculature compartment consistent with hematoma with underlying active extravasation not excluded given with the hyperdensity extending from the fracture site and adjacent vasculature. 3. No acute intrathoracic, intra-abdominal, intrapelvic traumatic injury. 4. No acute fracture or traumatic malalignment of the thoracic or lumbar spine. 5. Aortic Atherosclerosis (ICD10-I70.0) and Emphysema (ICD10-J43.9). 6. Prior screw fixation of the left humeral head with fracture of 1 of the screws. Electronically Signed   By: Morgane  Naveau M.D.   On: 10/06/2023 18:28   DG Wrist Complete Right Result Date: 10/06/2023 CLINICAL DATA:  Fall EXAM: RIGHT WRIST - COMPLETE 3+ VIEW COMPARISON:  None Available. FINDINGS: There is no evidence of fracture or dislocation.  First and second digit carpometacarpal joint degenerative changes. There is no evidence of arthropathy or  other focal bone abnormality. Soft tissues are unremarkable. Vascular calcification IMPRESSION: No acute displaced fracture or dislocation. Electronically Signed   By: Morgane  Naveau M.D.   On: 10/06/2023 18:10   DG Knee 1-2 Views Right Result Date: 10/06/2023 CLINICAL DATA:  144615 Pain 144615 EXAM: RIGHT KNEE - 1-2 VIEW COMPARISON:  None Available. FINDINGS: Total knee arthroplasty. No evidence of fracture, dislocation, or joint effusion. No evidence of arthropathy or other focal bone abnormality. Soft tissues are unremarkable. Atherosclerotic plaque. IMPRESSION: 1. Total knee arthroplasty. 2.  Negative for acute traumatic injury. Electronically Signed   By: Morgane  Naveau M.D.   On: 10/06/2023 18:09   DG Knee 1-2 Views Left Result Date: 10/06/2023 CLINICAL DATA:  144615 Pain 144615 EXAM: LEFT KNEE - 1-2 VIEW COMPARISON:  None Available. FINDINGS: Total knee arthroplasty. No evidence of fracture, dislocation, or joint effusion. No evidence of arthropathy or other focal bone abnormality. Soft tissues are unremarkable. IMPRESSION: 1. Total knee arthroplasty. 2.  Negative for acute traumatic injury. Electronically Signed   By: Morgane  Naveau M.D.   On: 10/06/2023 18:09   DG Shoulder Right Result Date: 10/06/2023 CLINICAL DATA:  fall EXAM: RIGHT SHOULDER - 2+ VIEW COMPARISON:  None Available. FINDINGS: There is no evidence of fracture or dislocation. Acromioclavicular joint degenerative changes. Subcutaneus soft tissue edema of the shoulder. IMPRESSION: No acute displaced fracture or dislocation. Electronically Signed   By: Morgane  Naveau M.D.   On: 10/06/2023 18:03   DG Hip Unilat W or Wo Pelvis 2-3 Views Left Result Date: 10/06/2023 CLINICAL DATA:  fall EXAM: DG HIP (WITH OR WITHOUT PELVIS) 2-3V LEFT; DG HIP (WITH OR WITHOUT PELVIS) 2-3V RIGHT COMPARISON:  None Available. FINDINGS: Acute  displaced and comminuted left hip intertrochanteric fracture. No left hip dislocation. No acute displaced fracture or dislocation of the right hip. No acute displaced fracture or diastasis of the bones of the pelvis. Visualized lower lumbar spine demonstrates surgical hardware. Mild to moderate right hip degenerative changes. There is no evidence of severe arthropathy or other focal bone abnormality. IMPRESSION: Acute displaced and comminuted left hip intertrochanteric fracture. Electronically Signed   By: Morgane  Naveau M.D.   On: 10/06/2023 18:02   DG Hip Unilat W or Wo Pelvis 2-3 Views Right Result Date: 10/06/2023 CLINICAL DATA:  fall EXAM: DG HIP (WITH OR WITHOUT PELVIS) 2-3V LEFT; DG HIP (WITH OR WITHOUT PELVIS) 2-3V RIGHT COMPARISON:  None Available. FINDINGS: Acute displaced and comminuted left hip intertrochanteric fracture. No left hip dislocation. No acute displaced fracture or dislocation of the right hip. No acute displaced fracture or diastasis of the bones of the pelvis. Visualized lower lumbar spine demonstrates surgical hardware. Mild to moderate right hip degenerative changes. There is no evidence of severe arthropathy or other focal bone abnormality. IMPRESSION: Acute displaced and comminuted left hip intertrochanteric fracture. Electronically Signed   By: Morgane  Naveau M.D.   On: 10/06/2023 18:02   DG Chest Portable 1 View Result Date: 10/06/2023 CLINICAL DATA:  fall EXAM: PORTABLE CHEST 1 VIEW COMPARISON:  Chest x-ray 12/26/2015 FINDINGS: The heart and mediastinal contours are within normal limits. Atherosclerotic plaque. No focal consolidation. No pulmonary edema. No pleural effusion. No pneumothorax. No acute osseous abnormality. IMPRESSION: 1. No active disease. 2.  Aortic Atherosclerosis (ICD10-I70.0). Electronically Signed   By: Morgane  Naveau M.D.   On: 10/06/2023 18:01    Review of Systems  Constitutional: Negative.   Musculoskeletal:        Left hip pain  All other  systems reviewed and are negative.  Blood pressure (!) 142/82, pulse (!) 114, temperature 98.6 F (37 C), temperature source Oral, resp. rate 18, height 5' 11 (1.803 m), weight 92.1 kg, SpO2 99%. Physical Exam Constitutional:      Comments: disorientated  HENT:     Head: Atraumatic.     Mouth/Throat:     Mouth: Mucous membranes are moist.   Eyes:     Extraocular Movements: Extraocular movements intact.    Cardiovascular:     Rate and Rhythm: Tachycardia present.  Pulmonary:     Effort: Pulmonary effort is normal.  Abdominal:     Palpations: Abdomen is soft.   Musculoskeletal:     Cervical back: Neck supple.     Comments: Left lower extremity with deformity through the hip.  Externally rotated.  There is abrasion about the knee but no significant swelling or tenderness to palpation.  Tender to palpation about the hip.  No tenderness along the leg, ankle or foot.  No evidence of bilateral upper extremity injury.  He is noted to move his arms without evidence of pain or dysfunction  Right lower extremity without evidence of injury.   Skin:    General: Skin is warm.     Findings: Bruising present.   Neurological:     Mental Status: He is disoriented.   Psychiatric:        Behavior: Behavior normal.     Assessment/Plan: Left intertrochanteric hip fracture after fall  Patient has a left intertrochanteric hip fracture.  He is ambulatory at baseline and therefore indicated for surgery.  I discussed this with the family.  They agreed to proceed.  Plan is for intramedullary nail fixation of left intertrochanteric hip.  Will plan for admission to the hospitalist team postoperatively.  He will work with physical therapy for disposition.  I discussed the risk, benefits and alternatives to surgery which include but not limited to wound healing complications, infection, nonunion, no malunion, need for further surgery and damage to surrounding structure.  There are also inherent  anesthetic risks which they will discuss with the anesthesiologist.  Will proceed with surgery.  Donnamarie Gables 10/06/2023, 8:14 PM

## 2023-10-06 NOTE — ED Notes (Signed)
 Help get patient on the monitor did EKG patient is resting with call bell in reach

## 2023-10-06 NOTE — Transfer of Care (Signed)
 Immediate Anesthesia Transfer of Care Note  Patient: [name]  Procedure(s) Performed: FIXATION, FRACTURE, INTERTROCHANTERIC, WITH INTRAMEDULLARY ROD (Left: Leg Upper)  Patient Location: PACU  Anesthesia Type:General  Level of Consciousness: drowsy and responds to stimulation  Airway & Oxygen  Therapy: Patient Spontanous Breathing and Patient connected to face mask oxygen   Post-op Assessment: Report given to RN and Post -op Vital signs reviewed and stable  Post vital signs: Reviewed and stable  Last Vitals:  Vitals Value Taken Time  BP 126/71 10/06/23 23:01  Temp    Pulse 96 10/06/23 23:07  Resp 17 10/06/23 23:07  SpO2 97 % 10/06/23 23:07  Vitals shown include unfiled device data.  Last Pain:  Vitals:   10/06/23 1544  TempSrc:   PainSc: 3          Complications: No notable events documented.

## 2023-10-06 NOTE — ED Triage Notes (Addendum)
 According [facility]: pt was found laying on his right side in the garage in urine and feces.Pt complaining of R sided hip pain with no deformity covered in bruises and  with skin breakdown on hips down. Pt last seen by neighbors at 4pm yesterday. Stroke screens by ems were negative.Pt incontinent, hx of bilateral knee replacement and diabetes.  Pt unable to straighten R knee fully.Pt arrived in a C collar.  Vitals   HR 86 afib on EKG Bp 116/80 Cbg 252 Spo2 98   18 gauge piv in AC.

## 2023-10-06 NOTE — ED Provider Notes (Signed)
 Emergency Department Provider Note   I have reviewed the triage vital signs and the nursing notes.   HISTORY  Chief Complaint No chief complaint on file.   HPI [name] is a 89 y.o. male with past history of A-fib (not anticoagulated), hypertension, hyperlipidemia, diabetes presents to the emergency department for evaluation of pain after a fall.  Patient states he went out to his garage to get something out of the fridge at around 10:45 PM yesterday.  He was walking back into the house when he lost balance on the second step falling backwards.  Unsure if he had a syncope event versus trip/fall but landed on his back.  He was unable to get up or call for help.  Neighbors found him later on in the day and EMS was called. He reports most of his pain is in the bilateral hips. No CP or SOB. No abdominal pain. Mild HA.   Past Medical History:  Diagnosis Date   Allergic rhinitis    Anxiety    Arthritis    Benign localized prostatic hyperplasia with lower urinary tract symptoms (LUTS)    Chronic back pain    treated with epidural injections   Complication of anesthesia    delirium with infection post op back surgery 04-01-2008    H/O hiatal hernia    History of cardiac arrhythmia    event monitor 01-18-2016 in epic (per pt heart rate drops),  showed SR, SB, rare episodes intercalated PVCs with no evidence high grade ectopy or atrial fib   Hyperlipidemia    Hypertension    followed by pcp   Sciatica of right side    Type 2 diabetes mellitus (HCC)    followed by pcp  (03-04-2019  check's cbg 3 times per week,  fasting cbg 109-140)   Upper airway resistance syndrome    per cardiologist , dr Loetta Ringer, note in [facility] 11-13-2018  dx from study done 2017 in epic    Urethral stricture     Review of Systems  Constitutional: No fever/chills Cardiovascular: Denies chest pain. Respiratory: Denies shortness of breath. Gastrointestinal: No abdominal pain.  No nausea, no vomiting.    Musculoskeletal: Positive bilateral hip pain.  Skin: Negative for rash. Neurological: Negative for headaches, focal weakness or numbness.  ____________________________________________   PHYSICAL EXAM:  VITAL SIGNS: ED Triage Vitals  Encounter Vitals Group     BP 10/06/23 1500 121/75     Pulse Rate 10/06/23 1500 89     Resp 10/06/23 1500 20     Temp 10/06/23 1518 98.6 F (37 C)     Temp Source 10/06/23 1518 Oral     SpO2 10/06/23 1500 93 %     Weight 10/06/23 1502 203 lb (92.1 kg)     Height 10/06/23 1502 5' 11 (1.803 m)   Constitutional: Alert and oriented. Patient appears fatigued but able to provide a fairly reasonable history.  Eyes: Conjunctivae are normal. PERRL. Head: Atraumatic. Nose: No congestion/rhinnorhea. Mouth/Throat: Mucous membranes are moist.   Neck: No stridor. C collar in place.  Cardiovascular: Normal rate, regular rhythm. Good peripheral circulation. Grossly normal heart sounds.   Respiratory: Normal respiratory effort.  No retractions. Lungs CTAB. Gastrointestinal: Soft and nontender. No distention.  Musculoskeletal: LLE is shortened and externally rotated. Pain with ROM of the right shoulder without crepitus. Mild tenderness to the right wrist.  Neurologic:  Normal speech and language. No gross focal neurologic deficits are appreciated.  Skin:  Skin is warm, dry.  Superficial skin breakdown from pressure on the RUE and RLE.  ____________________________________________   LABS (all labs ordered are listed, but only abnormal results are displayed)  Labs Reviewed  COMPREHENSIVE METABOLIC PANEL WITH GFR - Abnormal; Notable for the following components:      Result Value   Glucose, Bld 251 (*)    BUN 28 (*)    Creatinine, Ser 1.43 (*)    Calcium 8.3 (*)    Total Protein 6.0 (*)    Albumin 3.4 (*)    AST 126 (*)    Total Bilirubin 1.4 (*)    GFR, Estimated 47 (*)    All other components within normal limits  CBC WITH DIFFERENTIAL/PLATELET -  Abnormal; Notable for the following components:   WBC 16.4 (*)    Neutro Abs 14.3 (*)    Monocytes Absolute 1.3 (*)    All other components within normal limits  URINALYSIS, W/ REFLEX TO CULTURE (INFECTION SUSPECTED) - Abnormal; Notable for the following components:   APPearance HAZY (*)    Glucose, UA 50 (*)    Hgb urine dipstick MODERATE (*)    Ketones, ur 5 (*)    Protein, ur 100 (*)    Leukocytes,Ua SMALL (*)    Bacteria, UA RARE (*)    All other components within normal limits  CK - Abnormal; Notable for the following components:   Total CK 5,820 (*)    All other components within normal limits  TROPONIN I (HIGH SENSITIVITY) - Abnormal; Notable for the following components:   Troponin I (High Sensitivity) 52 (*)    All other components within normal limits  URINE CULTURE  ETHANOL  PROTIME-INR  LIPASE, BLOOD  TROPONIN I (HIGH SENSITIVITY)   ____________________________________________  EKG   EKG Interpretation Date/Time:  Saturday October 06 2023 15:00:06 EDT Ventricular Rate:  89 PR Interval:    QRS Duration:  112 QT Interval:  383 QTC Calculation: 466 R Axis:   85  Text Interpretation: Atrial fibrillation Borderline intraventricular conduction delay Low voltage, precordial leads Confirmed by Abby Hocking [phone]) on 10/06/2023 3:31:37 PM        ____________________________________________  RADIOLOGY  CT Head Wo Contrast Result Date: 10/06/2023 CLINICAL DATA:  Neck trauma (Age >= 65y); Head trauma, minor (Age >= 65y) EXAM: CT HEAD WITHOUT CONTRAST CT CERVICAL SPINE WITHOUT CONTRAST TECHNIQUE: Multidetector CT imaging of the head and cervical spine was performed following the standard protocol without intravenous contrast. Multiplanar CT image reconstructions of the cervical spine were also generated. RADIATION DOSE REDUCTION: This exam was performed according to the departmental dose-optimization program which includes automated exposure control, adjustment of the mA  and/or kV according to patient size and/or use of iterative reconstruction technique. COMPARISON:  None Available. FINDINGS: CT HEAD FINDINGS Brain: Patchy and confluent areas of decreased attenuation are noted throughout the deep and periventricular white matter of the cerebral hemispheres bilaterally, compatible with chronic microvascular ischemic disease. No evidence of large-territorial acute infarction. No parenchymal hemorrhage. No mass lesion. No extra-axial collection. No mass effect or midline shift. No hydrocephalus. Basilar cisterns are patent. Vascular: No hyperdense vessel. Atherosclerotic calcifications are present within the cavernous internal carotid and vertebral arteries. Skull: No acute fracture or focal lesion. Sinuses/Orbits: Paranasal sinuses and mastoid air cells are clear. Bilateral lens replacement. Otherwise the orbits are unremarkable. Other: None. CT CERVICAL SPINE FINDINGS Alignment: Normal. Skull base and vertebrae: Multilevel moderate degenerative change of the spine. Associated multilevel severe osseous neural foraminal stenosis. No severe osseous central canal  stenosis. No acute fracture. No aggressive appearing focal osseous lesion or focal pathologic process. Soft tissues and spinal canal: No prevertebral fluid or swelling. No visible canal hematoma. Upper chest: Unremarkable. Other: None. IMPRESSION: 1. No acute intracranial abnormality. 2. No acute displaced fracture or traumatic listhesis of the cervical spine. Electronically Signed   By: Morgane  Naveau M.D.   On: 10/06/2023 18:32   CT Cervical Spine Wo Contrast Result Date: 10/06/2023 CLINICAL DATA:  Neck trauma (Age >= 65y); Head trauma, minor (Age >= 65y) EXAM: CT HEAD WITHOUT CONTRAST CT CERVICAL SPINE WITHOUT CONTRAST TECHNIQUE: Multidetector CT imaging of the head and cervical spine was performed following the standard protocol without intravenous contrast. Multiplanar CT image reconstructions of the cervical spine  were also generated. RADIATION DOSE REDUCTION: This exam was performed according to the departmental dose-optimization program which includes automated exposure control, adjustment of the mA and/or kV according to patient size and/or use of iterative reconstruction technique. COMPARISON:  None Available. FINDINGS: CT HEAD FINDINGS Brain: Patchy and confluent areas of decreased attenuation are noted throughout the deep and periventricular white matter of the cerebral hemispheres bilaterally, compatible with chronic microvascular ischemic disease. No evidence of large-territorial acute infarction. No parenchymal hemorrhage. No mass lesion. No extra-axial collection. No mass effect or midline shift. No hydrocephalus. Basilar cisterns are patent. Vascular: No hyperdense vessel. Atherosclerotic calcifications are present within the cavernous internal carotid and vertebral arteries. Skull: No acute fracture or focal lesion. Sinuses/Orbits: Paranasal sinuses and mastoid air cells are clear. Bilateral lens replacement. Otherwise the orbits are unremarkable. Other: None. CT CERVICAL SPINE FINDINGS Alignment: Normal. Skull base and vertebrae: Multilevel moderate degenerative change of the spine. Associated multilevel severe osseous neural foraminal stenosis. No severe osseous central canal stenosis. No acute fracture. No aggressive appearing focal osseous lesion or focal pathologic process. Soft tissues and spinal canal: No prevertebral fluid or swelling. No visible canal hematoma. Upper chest: Unremarkable. Other: None. IMPRESSION: 1. No acute intracranial abnormality. 2. No acute displaced fracture or traumatic listhesis of the cervical spine. Electronically Signed   By: Morgane  Naveau M.D.   On: 10/06/2023 18:32   CT CHEST ABDOMEN PELVIS W CONTRAST Result Date: 10/06/2023 CLINICAL DATA:  Polytrauma, blunt.  Fall EXAM: CT CHEST, ABDOMEN, AND PELVIS WITH CONTRAST TECHNIQUE: Multidetector CT imaging of the chest, abdomen  and pelvis was performed following the standard protocol during bolus administration of intravenous contrast. RADIATION DOSE REDUCTION: This exam was performed according to the departmental dose-optimization program which includes automated exposure control, adjustment of the mA and/or kV according to patient size and/or use of iterative reconstruction technique. CONTRAST:  75mL OMNIPAQUE IOHEXOL 350 MG/ML SOLN COMPARISON:  Chest x-ray 12/26/2015 FINDINGS: CHEST: Cardiovascular: No aortic injury. The thoracic aorta is normal in caliber. The heart is normal in size. No significant pericardial effusion. Severe atherosclerotic plaque. At least 3 vessel coronary calcification. Mediastinum/Nodes: No pneumomediastinum. No mediastinal hematoma. The esophagus is unremarkable.  Small hiatal hernia. The thyroid  is unremarkable. The central airways are patent. No mediastinal, hilar, or axillary lymphadenopathy. Lungs/Pleura: Severe emphysematous changes more prominent along the upper lung zones. No focal consolidation. No pulmonary nodule. No pulmonary mass. No pulmonary contusion or laceration. No pneumatocele formation. No pleural effusion. No pneumothorax. No hemothorax. Musculoskeletal/Chest wall: No chest wall mass. Prior screw fixation of the left humeral head with fracture of 1 of the screws (4:14). No acute rib or sternal fracture. No spinal fracture. ABDOMEN / PELVIS: Hepatobiliary: Not enlarged. No focal lesion. No laceration or subcapsular hematoma.  The gallbladder is otherwise unremarkable with no radio-opaque gallstones. No biliary ductal dilatation. Pancreas: Normal pancreatic contour. No main pancreatic duct dilatation. Spleen: Not enlarged. No focal lesion. No laceration, subcapsular hematoma, or vascular injury. Adrenals/Urinary Tract: No nodularity bilaterally. Bilateral kidneys enhance symmetrically. No hydronephrosis. No contusion, laceration, or subcapsular hematoma. Fluid is lesions of the kidneys likely  represent simple renal cysts. Simple renal cysts, in the absence of clinically indicated signs/symptoms, require no independent follow-up. No injury to the vascular structures or collecting systems. No hydroureter. The urinary bladder is unremarkable. On delayed imaging, there is no urothelial wall thickening and there are no filling defects in the opacified portions of the bilateral collecting systems or ureters. Stomach/Bowel: No small or large bowel wall thickening or dilatation. The appendix is unremarkable. Vasculature/Lymphatics: Severe atherosclerotic plaque. No abdominal aorta or iliac aneurysm. No active contrast extravasation or pseudoaneurysm. No abdominal, pelvic, inguinal lymphadenopathy. Reproductive: Prostate is unremarkable. Other: No simple free fluid ascites. No pneumoperitoneum. No hemoperitoneum. No mesenteric hematoma identified. No organized fluid collection. Musculoskeletal: No significant soft tissue hematoma. Enlarged and heterogeneous asymmetric left adductor musculature compartment consistent with hematoma with underlying active extravasation not excluded given with the hyperdensity extending from the fracture site and adjacent vasculature. Left hip acute displaced and comminuted intertrochanteric fracture. L3-L5 posterolateral surgical hardware fusion. Chronic L4 fracture. No spinal fracture. Other ports and devices: None. IMPRESSION: 1. Left hip acute displaced and comminuted intertrochanteric fracture. 2. Associated left adductor musculature compartment consistent with hematoma with underlying active extravasation not excluded given with the hyperdensity extending from the fracture site and adjacent vasculature. 3. No acute intrathoracic, intra-abdominal, intrapelvic traumatic injury. 4. No acute fracture or traumatic malalignment of the thoracic or lumbar spine. 5. Aortic Atherosclerosis (ICD10-I70.0) and Emphysema (ICD10-J43.9). 6. Prior screw fixation of the left humeral head with  fracture of 1 of the screws. Electronically Signed   By: Morgane  Naveau M.D.   On: 10/06/2023 18:28   DG Wrist Complete Right Result Date: 10/06/2023 CLINICAL DATA:  Fall EXAM: RIGHT WRIST - COMPLETE 3+ VIEW COMPARISON:  None Available. FINDINGS: There is no evidence of fracture or dislocation. First and second digit carpometacarpal joint degenerative changes. There is no evidence of arthropathy or other focal bone abnormality. Soft tissues are unremarkable. Vascular calcification IMPRESSION: No acute displaced fracture or dislocation. Electronically Signed   By: Morgane  Naveau M.D.   On: 10/06/2023 18:10   DG Knee 1-2 Views Right Result Date: 10/06/2023 CLINICAL DATA:  144615 Pain 144615 EXAM: RIGHT KNEE - 1-2 VIEW COMPARISON:  None Available. FINDINGS: Total knee arthroplasty. No evidence of fracture, dislocation, or joint effusion. No evidence of arthropathy or other focal bone abnormality. Soft tissues are unremarkable. Atherosclerotic plaque. IMPRESSION: 1. Total knee arthroplasty. 2.  Negative for acute traumatic injury. Electronically Signed   By: Morgane  Naveau M.D.   On: 10/06/2023 18:09   DG Knee 1-2 Views Left Result Date: 10/06/2023 CLINICAL DATA:  144615 Pain 144615 EXAM: LEFT KNEE - 1-2 VIEW COMPARISON:  None Available. FINDINGS: Total knee arthroplasty. No evidence of fracture, dislocation, or joint effusion. No evidence of arthropathy or other focal bone abnormality. Soft tissues are unremarkable. IMPRESSION: 1. Total knee arthroplasty. 2.  Negative for acute traumatic injury. Electronically Signed   By: Morgane  Naveau M.D.   On: 10/06/2023 18:09   DG Shoulder Right Result Date: 10/06/2023 CLINICAL DATA:  fall EXAM: RIGHT SHOULDER - 2+ VIEW COMPARISON:  None Available. FINDINGS: There is no evidence of fracture or dislocation. Acromioclavicular joint  degenerative changes. Subcutaneus soft tissue edema of the shoulder. IMPRESSION: No acute displaced fracture or dislocation.  Electronically Signed   By: Morgane  Naveau M.D.   On: 10/06/2023 18:03   DG Hip Unilat W or Wo Pelvis 2-3 Views Left Result Date: 10/06/2023 CLINICAL DATA:  fall EXAM: DG HIP (WITH OR WITHOUT PELVIS) 2-3V LEFT; DG HIP (WITH OR WITHOUT PELVIS) 2-3V RIGHT COMPARISON:  None Available. FINDINGS: Acute displaced and comminuted left hip intertrochanteric fracture. No left hip dislocation. No acute displaced fracture or dislocation of the right hip. No acute displaced fracture or diastasis of the bones of the pelvis. Visualized lower lumbar spine demonstrates surgical hardware. Mild to moderate right hip degenerative changes. There is no evidence of severe arthropathy or other focal bone abnormality. IMPRESSION: Acute displaced and comminuted left hip intertrochanteric fracture. Electronically Signed   By: Morgane  Naveau M.D.   On: 10/06/2023 18:02   DG Hip Unilat W or Wo Pelvis 2-3 Views Right Result Date: 10/06/2023 CLINICAL DATA:  fall EXAM: DG HIP (WITH OR WITHOUT PELVIS) 2-3V LEFT; DG HIP (WITH OR WITHOUT PELVIS) 2-3V RIGHT COMPARISON:  None Available. FINDINGS: Acute displaced and comminuted left hip intertrochanteric fracture. No left hip dislocation. No acute displaced fracture or dislocation of the right hip. No acute displaced fracture or diastasis of the bones of the pelvis. Visualized lower lumbar spine demonstrates surgical hardware. Mild to moderate right hip degenerative changes. There is no evidence of severe arthropathy or other focal bone abnormality. IMPRESSION: Acute displaced and comminuted left hip intertrochanteric fracture. Electronically Signed   By: Morgane  Naveau M.D.   On: 10/06/2023 18:02   DG Chest Portable 1 View Result Date: 10/06/2023 CLINICAL DATA:  fall EXAM: PORTABLE CHEST 1 VIEW COMPARISON:  Chest x-ray 12/26/2015 FINDINGS: The heart and mediastinal contours are within normal limits. Atherosclerotic plaque. No focal consolidation. No pulmonary edema. No pleural effusion. No  pneumothorax. No acute osseous abnormality. IMPRESSION: 1. No active disease. 2.  Aortic Atherosclerosis (ICD10-I70.0). Electronically Signed   By: Morgane  Naveau M.D.   On: 10/06/2023 18:01    ____________________________________________   PROCEDURES  Procedure(s) performed:   Procedures  CRITICAL CARE Performed by: Roberts Ching Total critical care time: 35 minutes Critical care time was exclusive of separately billable procedures and treating other patients. Critical care was necessary to treat or prevent imminent or life-threatening deterioration. Critical care was time spent personally by me on the following activities: development of treatment plan with patient and/or surrogate as well as nursing, discussions with consultants, evaluation of patient's response to treatment, examination of patient, obtaining history from patient or surrogate, ordering and performing treatments and interventions, ordering and review of laboratory studies, ordering and review of radiographic studies, pulse oximetry and re-evaluation of patient's condition.  Abby Hocking, MD Emergency Medicine  ____________________________________________   INITIAL IMPRESSION / ASSESSMENT AND PLAN / ED COURSE  Pertinent labs & imaging results that were available during my care of the patient were reviewed by me and considered in my medical decision making (see chart for details).   This patient is Presenting for Evaluation of fall, which does require a range of treatment options, and is a complaint that involves a high risk of morbidity and mortality.  The Differential Diagnoses includes subdural hematoma, epidural hematoma, acute concussion, traumatic subarachnoid hemorrhage, cerebral contusions, etc.   Critical Interventions-    Medications  fentaNYL  (SUBLIMAZE ) injection 25 mcg (25 mcg Intravenous Given 10/06/23 1544)  0.9 %  sodium chloride  infusion (has no  administration in time range)  sodium chloride  0.9  % bolus 500 mL (0 mLs Intravenous Stopped 10/06/23 1654)  sodium chloride  0.9 % bolus 500 mL (500 mLs Intravenous New Bag/Given 10/06/23 1913)  iohexol (OMNIPAQUE) 350 MG/ML injection 75 mL (75 mLs Intravenous Contrast Given 10/06/23 1745)  LORazepam (ATIVAN) injection 0.5 mg (0.5 mg Intravenous Given 10/06/23 1913)    Reassessment after intervention:  agitation improved.    I did obtain Additional Historical Information from family at bedside.    Clinical Laboratory Tests Ordered, included CK of 5800. Creatinine slightly elevated to 1.43. No anemia.   Radiologic Tests Ordered, included CT head, hip XR, CXR. I independently interpreted the images and agree with radiology interpretation.   Cardiac Monitor Tracing which shows A fib.    Social Determinants of Health Risk patient lives at home alone.   Consult complete with Dr. Hulda Mage with orthopedics. Plan for OR tomorrow morning pending medical clearance.   TRH, Dr. Carlton Chick. Plan for admit. Ok with OR tonight if able.   Medical Decision Making: Summary:  The patient presents to the emergency department for evaluation after a fall last night around 11 PM.  He is laying on the ground until early this afternoon when he was discovered by neighbors and transported by EMS.  Gives a fairly reasonable history regarding the incident including time down.  He does have some mild confusion as he does feel like [facility] the [facility] after he was found and then transported here.  He does appear to have an injury to the left hip given the shortening and external rotation but also having pain in the right hip, shoulder, wrist.  Plan for imaging along with screening blood work and reassess.  He remains in spine precautions.  Reevaluation with update and discussion with patient and family at bedside. Discussed results and plan for surgery to repair the hip. They are in agreement.   Patient's presentation is most consistent with acute presentation with  potential threat to life or bodily function.   Disposition: admit  ____________________________________________  FINAL CLINICAL IMPRESSION(S) / ED DIAGNOSES  Final diagnoses:  Fall, initial encounter  Closed fracture of left hip, initial encounter [facility])  Traumatic rhabdomyolysis, initial encounter [facility])    Note:  This document was prepared using Dragon voice recognition software and may include unintentional dictation errors.  Abby Hocking, MD,  [facility] Emergency Medicine    Jazzmon Prindle, Shereen Dike, MD 10/06/23 Gerry Krone

## 2023-10-06 NOTE — ED Notes (Signed)
 Report given to the PACU. To be transported upstairs.

## 2023-10-06 NOTE — H&P (Signed)
 History and Physical    Patient: [name] DOB: 03-28-1932 DOA: 10/06/2023 DOS: the patient was seen and examined on 10/06/2023 PCP: Lonzie Robins, MD  Patient coming from: Home  Chief Complaint: No chief complaint on file.  HPI: [name] is a 89 y.o. male with medical history significant of atrial fibrillation, essential hypertension, hyperlipidemia, type 2 diabetes, who was brought in from home after a mechanical fall.  Patient was apparently at home.  He went out to the garage to get something out of the fridge around 1045 last night.  Patient was walking back into the house when he lost his balance on the second stair falling backwards.  Suspected that he may have had syncope versus chemical fall.  Patient unable to recall what happened.  He fell backwards.  Patient was unable to get up until this morning when he was found.  EMS was called.  Patient having pain in the bilateral hips.  He was found to have left intertrochanteric fracture.  Surgery is being planned and patient is being admitted to the medical service.  He did have mild rhabdomyolysis with CK of more than 5000.  Review of Systems: As mentioned in the history of present illness. All other systems reviewed and are negative. Past Medical History:  Diagnosis Date   Allergic rhinitis    Anxiety    Arthritis    Benign localized prostatic hyperplasia with lower urinary tract symptoms (LUTS)    Chronic back pain    treated with epidural injections   Complication of anesthesia    delirium with infection post op back surgery 04-01-2008    H/O hiatal hernia    History of cardiac arrhythmia    event monitor 01-18-2016 in epic (per pt heart rate drops),  showed SR, SB, rare episodes intercalated PVCs with no evidence high grade ectopy or atrial fib   Hyperlipidemia    Hypertension    followed by pcp   Sciatica of right side    Type 2 diabetes mellitus (HCC)    followed by pcp  (03-04-2019  check's cbg 3  times per week,  fasting cbg 109-140)   Upper airway resistance syndrome    per cardiologist , dr Loetta Ringer, note in [facility] 11-13-2018  dx from study done 2017 in epic    Urethral stricture    Past Surgical History:  Procedure Laterality Date   APPENDECTOMY  age 89   CATARACT EXTRACTION W/ INTRAOCULAR LENS  IMPLANT, BILATERAL Bilateral 2017; 2018   CLOSED REDUCTION GREATER HUMERAL TUBEROSITY FRACTURE Left 06-26-2009   @[facility]    KNEE ARTHROSCOPY Bilateral right 11/ 2007;  left 2012   LAPAROSCOPIC NISSEN FUNDOPLICATION  11-07-2000   dr Gaylyn Keas @[facility]    LUMBAR SPINE SURGERY  x4  last one 04-01-2008 dr Rochelle Chu @[facility]    this including ORIF L4 compression fracture and fusion L3-5   NASAL SEPTOPLASTY W/ TURBINOPLASTY  2000   SHOULDER ARTHROSCOPY Left 07-20-2009   dr Agatha Horsfall @[facility]    ORIF of greater tuberosity and debridement   STRABISMUS SURGERY Left 09/11/2014   Procedure: REPAIR STRABISMUS LEFT EYE;  Surgeon: Dorothey Gate, MD;  Location: [facility];  Service: Ophthalmology;  Laterality: Left;   TONSILLECTOMY AND ADENOIDECTOMY  age 53   TOTAL KNEE ARTHROPLASTY Right 05/13/2007    dr Jinger Mount @[facility]    TOTAL KNEE ARTHROPLASTY Left 08/05/2012   Procedure: TOTAL KNEE ARTHROPLASTY;  Surgeon: Genevie Kerns, MD;  Location: [facility] OR;  Service: Orthopedics;  Laterality: Left;   TRANSURETHRAL  RESECTION OF PROSTATE N/A 03/05/2019   Procedure: TRANSURETHRAL RESECTION OF THE PROSTATE (TURP), BIPOLAR/ URETHRAL DILATION;  Surgeon: Adelbert Homans, MD;  Location: [facility];  Service: Urology;  Laterality: N/A;   Social History:  reports that he quit smoking about 40 years ago. His smoking use included cigarettes. He started smoking about 65 years ago. He quit smokeless tobacco use about 37 years ago.  His smokeless tobacco use included chew. He reports that he does not drink alcohol and does not use drugs.  No Known Allergies  Family History  Problem Relation Age of Onset   Heart attack Father     Stroke Maternal Grandfather    Cancer Other    Stroke Mother    COPD Sister    Other Brother        accident   Heart failure Sister    COPD Brother     Prior to Admission medications   Medication Sig Start Date End Date Taking? Authorizing Provider  aspirin  EC 81 MG tablet Take 81 mg by mouth daily. Swallow whole.    [provider]  cetirizine (ZYRTEC) 10 MG tablet Take 10 mg by mouth at bedtime.     [provider]  escitalopram (LEXAPRO) 10 MG tablet Take 10 mg by mouth daily.    [provider]  metFORMIN  (GLUCOPHAGE ) 500 MG tablet Take 500 mg by mouth 3 (three) times daily.    [provider]  mirabegron ER (MYRBETRIQ) 50 MG TB24 tablet Take 50 mg by mouth daily.    [provider]  mupirocin  ointment (BACTROBAN ) 2 % Apply 1 Application topically 2 (two) times daily. 11/11/22   Leath-Warren, Belen Bowers, NP  OVER THE COUNTER MEDICATION Place 1 spray into both nostrils daily as needed (for congestion). [employer] Nasal Spray    [provider]  simvastatin  (ZOCOR ) 20 MG tablet Take 20 mg by mouth every evening.     [provider]    Physical Exam: Vitals:   10/06/23 1500 10/06/23 1502 10/06/23 1518 10/06/23 1802  BP: 121/75   (!) 142/82  Pulse: 89   (!) 114  Resp: 20   18  Temp:   98.6 F (37 C)   TempSrc:   Oral   SpO2: 93%   99%  Weight:  92.1 kg    Height:  5' 11 (1.803 m)     Constitutional: Frail, weak and confused NAD, calm, comfortable Eyes: PERRL, lids and conjunctivae normal ENMT: Mucous membranes are moist. Posterior pharynx clear of any exudate or lesions.Normal dentition.  Neck: normal, supple, no masses, no thyromegaly Respiratory: clear to auscultation bilaterally, no wheezing, no crackles. Normal respiratory effort. No accessory muscle use.  Cardiovascular: Regular rate and rhythm, no murmurs / rubs / gallops. No extremity edema. 2+ pedal pulses. No carotid bruits.  Abdomen: no tenderness, no  masses palpated. No hepatosplenomegaly. Bowel sounds positive.  Musculoskeletal: Left lower extremities laterally rotated and shortened. No induration Neurologic: CN 2-12 grossly intact. Sensation intact, DTR normal. Strength 5/5 in all 4.  Psychiatric: Confused. Normal mood  Data Reviewed:  Glucose 251, BUN 28 creatinine 1.43 albumin 3.4 AST 126 total protein 6.0 total bilirubin 1.4 CK is 08/30/2018 troponin 52 white count 16.4 urinalysis showed hazy urine small leukocytes, CT cervical spine CT chest abdomen pelvis head CT without contrast that shows left hip acute displaced and comminuted intertrochanter fracture, associated left adductor musculature compartment consistent with hematoma with underlying active extravasation not excluded, but no other  abnormalities chest x-ray showed no active disease also x-ray of the left hip showed acute commuted fracture x-ray of the knee showed total knee arthroplasty x-ray of the shoulder showed no acute displaced fracture and x-ray of the wrist also showed no fracture  Assessment and Plan:  #1 status post fall: Suspected mechanical versus syncopal fall.  Patient is now confused.  Multiple x-rays and CT scans only showed the left intertrochanteric fracture.  Patient to be admitted.  Orthopedics already consulted.  Patient to have surgical repair of his fracture.  N.p.o. at the moment.  #2 acute left intertrochanteric fracture: Secondary to a fall.  Patient to be taken to the OR for surgery more than likely.  Patient has been cleared.  #3 type 2 diabetes: Sliding scale insulin .  Once patient starts oral intake will resume home regimen 2.  #3 essential hypertension: Blood pressure is controlled.  Continue to monitor  #4 hyperlipidemia: Continue with statin  #5 AKI: Hydrate.  Monitor renal functions.  #6 rhabdomyolysis: Mild.  Aggressively hydrate and monitor CPK  #7 obstructive sleep apnea: CPAP at night  #8 BPH: Will resume home regimen after  surgery  #9 atrial fibrillation: Not on anticoagulation but rate is controlled.  Continue to monitor    Advance Care Planning:   Code Status: Prior full code  Consults: Orthopedics, Dr. Hulda Mage  Family Communication: No family at bedside  Severity of Illness: The appropriate patient status for this patient is INPATIENT. Inpatient status is judged to be reasonable and necessary in order to provide the required intensity of service to ensure the patient's safety. The patient's presenting symptoms, physical exam findings, and initial radiographic and laboratory data in the context of their chronic comorbidities is felt to place them at high risk for further clinical deterioration. Furthermore, it is not anticipated that the patient will be medically stable for discharge from the hospital within 2 midnights of admission.   * I certify that at the point of admission it is my clinical judgment that the patient will require inpatient hospital care spanning beyond 2 midnights from the point of admission due to high intensity of service, high risk for further deterioration and high frequency of surveillance required.*  AuthorCarolin Chyle, MD 10/06/2023 8:12 PM  For on call review [email or web address].

## 2023-10-06 NOTE — ED Notes (Addendum)
 Pt found trying to get out of bed after hr found to be 120-140s help me get out of this bed when asked where he was he said I am in this room, I am in my house in the back. After reorienting and calming down pt hr fell to 110s. CCMD report x3 beat run of vtach during this period. Long MD aware via secure chat.

## 2023-10-07 DIAGNOSIS — S72002A Fracture of unspecified part of neck of left femur, initial encounter for closed fracture: Secondary | ICD-10-CM

## 2023-10-07 LAB — BASIC METABOLIC PANEL WITH GFR
Anion gap: 8 (ref 5–15)
BUN: 26 mg/dL — ABNORMAL HIGH (ref 8–23)
CO2: 22 mmol/L (ref 22–32)
Calcium: 7.5 mg/dL — ABNORMAL LOW (ref 8.9–10.3)
Chloride: 105 mmol/L (ref 98–111)
Creatinine, Ser: 1.24 mg/dL (ref 0.61–1.24)
GFR, Estimated: 56 mL/min — ABNORMAL LOW (ref >60)
Glucose, Bld: 235 mg/dL — ABNORMAL HIGH (ref 70–99)
Potassium: 4.6 mmol/L (ref 3.5–5.1)
Sodium: 135 mmol/L (ref 135–145)

## 2023-10-07 LAB — CBC
HCT: 37.2 % — ABNORMAL LOW (ref 39.0–52.0)
Hemoglobin: 12.2 g/dL — ABNORMAL LOW (ref 13.0–17.0)
MCH: 30.5 pg (ref 26.0–34.0)
MCHC: 32.8 g/dL (ref 30.0–36.0)
MCV: 93 fL (ref 80.0–100.0)
Platelets: 172 10*3/uL (ref 150–400)
RBC: 4 MIL/uL — ABNORMAL LOW (ref 4.22–5.81)
RDW: 13.2 % (ref 11.5–15.5)
WBC: 14.4 10*3/uL — ABNORMAL HIGH (ref 4.0–10.5)
nRBC: 0 % (ref 0.0–0.2)

## 2023-10-07 LAB — SURGICAL PCR SCREEN
MRSA, PCR: POSITIVE — AB
Staphylococcus aureus: POSITIVE — AB

## 2023-10-07 LAB — HEMOGLOBIN A1C
Hgb A1c MFr Bld: 8.2 % — ABNORMAL HIGH (ref 4.8–5.6)
Mean Plasma Glucose: 188.64 mg/dL

## 2023-10-07 LAB — MAGNESIUM: Magnesium: 1.7 mg/dL (ref 1.7–2.4)

## 2023-10-07 LAB — GLUCOSE, CAPILLARY
Glucose-Capillary: 230 mg/dL — ABNORMAL HIGH (ref 70–99)
Glucose-Capillary: 171 mg/dL — ABNORMAL HIGH (ref 70–99)
Glucose-Capillary: 121 mg/dL — ABNORMAL HIGH (ref 70–99)
Glucose-Capillary: 147 mg/dL — ABNORMAL HIGH (ref 70–99)

## 2023-10-07 LAB — CREATININE, SERUM
Creatinine, Ser: 1.26 mg/dL — ABNORMAL HIGH (ref 0.61–1.24)
GFR, Estimated: 55 mL/min — ABNORMAL LOW (ref >60)

## 2023-10-07 MED ORDER — CHLORHEXIDINE GLUCONATE 0.12 % MT SOLN
15.0000 mL | Freq: Two times a day (BID) | OROMUCOSAL | Status: DC
  Administered 2023-10-07 – 2023-10-11 (×9): 15 mL via OROMUCOSAL
  Filled 2023-10-07 (×10): qty 15

## 2023-10-07 MED ORDER — MORPHINE SULFATE (PF) 2 MG/ML IV SOLN
0.5000 mg | INTRAVENOUS | Status: DC | PRN
  Filled 2023-10-07: qty 1

## 2023-10-07 MED ORDER — ACETAMINOPHEN 500 MG PO TABS
500.0000 mg | ORAL_TABLET | Freq: Four times a day (QID) | ORAL | Status: AC
  Administered 2023-10-07 (×2): 500 mg via ORAL
  Filled 2023-10-07 (×3): qty 1

## 2023-10-07 MED ORDER — ACETAMINOPHEN 325 MG PO TABS: 325.0000 mg | ORAL_TABLET | Freq: Four times a day (QID) | ORAL | Status: DC | PRN

## 2023-10-07 MED ORDER — ACETAMINOPHEN 10 MG/ML IV SOLN
1000.0000 mg | Freq: Once | INTRAVENOUS | Status: AC
  Administered 2023-10-07: 1000 mg via INTRAVENOUS
  Filled 2023-10-07: qty 100

## 2023-10-07 MED ORDER — SENNOSIDES-DOCUSATE SODIUM 8.6-50 MG PO TABS
1.0000 | ORAL_TABLET | Freq: Two times a day (BID) | ORAL | Status: DC
  Administered 2023-10-07 – 2023-10-08 (×3): 1 via ORAL
  Filled 2023-10-07 (×3): qty 1

## 2023-10-07 MED ORDER — METOCLOPRAMIDE HCL 5 MG/ML IJ SOLN: 5.0000 mg | Freq: Three times a day (TID) | INTRAMUSCULAR | Status: DC | PRN

## 2023-10-07 MED ORDER — MIRABEGRON ER 50 MG PO TB24
50.0000 mg | ORAL_TABLET | Freq: Every day | ORAL | Status: DC
  Administered 2023-10-07 – 2023-10-10 (×4): 50 mg via ORAL
  Filled 2023-10-07 (×5): qty 1

## 2023-10-07 MED ORDER — DOCUSATE SODIUM 100 MG PO CAPS
100.0000 mg | ORAL_CAPSULE | Freq: Two times a day (BID) | ORAL | Status: DC
Start: 2023-10-07 — End: 2023-10-07
  Administered 2023-10-07: 100 mg via ORAL
  Filled 2023-10-07: qty 1

## 2023-10-07 MED ORDER — HYDROCODONE-ACETAMINOPHEN 7.5-325 MG PO TABS: 1.0000 | ORAL_TABLET | ORAL | Status: DC | PRN

## 2023-10-07 MED ORDER — ONDANSETRON HCL 4 MG PO TABS: 4.0000 mg | ORAL_TABLET | Freq: Four times a day (QID) | ORAL | Status: DC | PRN

## 2023-10-07 MED ORDER — ENOXAPARIN SODIUM 30 MG/0.3ML IJ SOSY
30.0000 mg | PREFILLED_SYRINGE | Freq: Every day | INTRAMUSCULAR | Status: DC
  Administered 2023-10-07: 30 mg via SUBCUTANEOUS
  Filled 2023-10-07: qty 0.3

## 2023-10-07 MED ORDER — ONDANSETRON HCL 4 MG/2ML IJ SOLN: 4.0000 mg | Freq: Four times a day (QID) | INTRAMUSCULAR | Status: DC | PRN

## 2023-10-07 MED ORDER — HYDROCODONE-ACETAMINOPHEN 5-325 MG PO TABS
1.0000 | ORAL_TABLET | ORAL | Status: DC | PRN
  Administered 2023-10-07: 1 via ORAL
  Filled 2023-10-07: qty 1

## 2023-10-07 MED ORDER — CEFAZOLIN SODIUM-DEXTROSE 2-4 GM/100ML-% IV SOLN
2.0000 g | Freq: Four times a day (QID) | INTRAVENOUS | Status: AC
  Administered 2023-10-07 (×2): 2 g via INTRAVENOUS
  Filled 2023-10-07 (×2): qty 100

## 2023-10-07 MED ORDER — ENOXAPARIN SODIUM 40 MG/0.4ML IJ SOSY
40.0000 mg | PREFILLED_SYRINGE | Freq: Every day | INTRAMUSCULAR | Status: DC
  Administered 2023-10-08 – 2023-10-11 (×4): 40 mg via SUBCUTANEOUS
  Filled 2023-10-07 (×4): qty 0.4

## 2023-10-07 MED ORDER — PHENOL 1.4 % MT LIQD
1.0000 | OROMUCOSAL | Status: DC | PRN
  Administered 2023-10-07: 1 via OROMUCOSAL
  Filled 2023-10-07: qty 177

## 2023-10-07 MED ORDER — MUPIROCIN 2 % EX OINT
1.0000 | TOPICAL_OINTMENT | Freq: Two times a day (BID) | CUTANEOUS | Status: DC
  Administered 2023-10-07 – 2023-10-11 (×9): 1 via NASAL
  Filled 2023-10-07 (×3): qty 22

## 2023-10-07 MED ORDER — SIMVASTATIN 20 MG PO TABS
20.0000 mg | ORAL_TABLET | Freq: Every day | ORAL | Status: DC
  Administered 2023-10-07 – 2023-10-08 (×2): 20 mg via ORAL
  Filled 2023-10-07 (×2): qty 1

## 2023-10-07 MED ORDER — METOCLOPRAMIDE HCL 5 MG PO TABS: 5.0000 mg | ORAL_TABLET | Freq: Three times a day (TID) | ORAL | Status: DC | PRN

## 2023-10-07 MED ORDER — CHLORHEXIDINE GLUCONATE CLOTH 2 % EX PADS
6.0000 | MEDICATED_PAD | Freq: Every day | CUTANEOUS | Status: DC
  Administered 2023-10-07 – 2023-10-10 (×4): 6 via TOPICAL

## 2023-10-07 NOTE — Plan of Care (Signed)
  Problem: Education: Goal: Ability to describe self-care measures that may prevent or decrease complications (Diabetes Survival Skills Education) will improve Outcome: Progressing Goal: Individualized Educational Video(s) Outcome: Progressing   Problem: Coping: Goal: Ability to adjust to condition or change in health will improve Outcome: Progressing   Problem: Fluid Volume: Goal: Ability to maintain a balanced intake and output will improve Outcome: Progressing   Problem: Health Behavior/Discharge Planning: Goal: Ability to identify and utilize available resources and services will improve Outcome: Progressing Goal: Ability to manage health-related needs will improve Outcome: Progressing   Problem: Metabolic: Goal: Ability to maintain appropriate glucose levels will improve Outcome: Progressing   Problem: Nutritional: Goal: Maintenance of adequate nutrition will improve Outcome: Progressing Goal: Progress toward achieving an optimal weight will improve Outcome: Progressing   Problem: Skin Integrity: Goal: Risk for impaired skin integrity will decrease Outcome: Progressing   Problem: Tissue Perfusion: Goal: Adequacy of tissue perfusion will improve Outcome: Progressing   Problem: Education: Goal: Ability to describe self-care measures that may prevent or decrease complications (Diabetes Survival Skills Education) will improve Outcome: Progressing Goal: Individualized Educational Video(s) Outcome: Progressing   Problem: Coping: Goal: Ability to adjust to condition or change in health will improve Outcome: Progressing   Problem: Fluid Volume: Goal: Ability to maintain a balanced intake and output will improve Outcome: Progressing   Problem: Health Behavior/Discharge Planning: Goal: Ability to identify and utilize available resources and services will improve Outcome: Progressing Goal: Ability to manage health-related needs will improve Outcome: Progressing    Problem: Metabolic: Goal: Ability to maintain appropriate glucose levels will improve Outcome: Progressing   Problem: Nutritional: Goal: Maintenance of adequate nutrition will improve Outcome: Progressing Goal: Progress toward achieving an optimal weight will improve Outcome: Progressing   Problem: Skin Integrity: Goal: Risk for impaired skin integrity will decrease Outcome: Progressing   Problem: Tissue Perfusion: Goal: Adequacy of tissue perfusion will improve Outcome: Progressing

## 2023-10-07 NOTE — Progress Notes (Signed)
 PATIENT ID: ESIAH BAZINET  MRN: [id number]  DOB/AGE:  1932/03/22 / 89 y.o.  1 Day Post-Op Procedure(s) (LRB): FIXATION, FRACTURE, INTERTROCHANTERIC, WITH INTRAMEDULLARY ROD (Left)  Subjective: Patient sleeping upon entry to room. When awoken he reports mild pain in the left hip region and returns to sleeping.    Objective: Vital signs in last 24 hours: Temp:  [97.8 F (36.6 C)-98.9 F (37.2 C)] 97.8 F (36.6 C) (06/15 0600) Pulse Rate:  [55-114] 93 (06/15 0600) Resp:  [12-20] 19 (06/15 0600) BP: (110-142)/(62-82) 114/75 (06/15 0600) SpO2:  [86 %-100 %] 92 % (06/15 0600) Weight:  [92.1 kg] 92.1 kg (06/14 1502)  Intake/Output from previous day: 06/14 0701 - 06/15 0700 In: 1150 [I.V.:1000; IV Piggyback:150] Out: 525 [Urine:475; Blood:50]   Recent Labs    10/06/23 1525 10/07/23 0607  HGB 13.8 12.2*   Recent Labs    10/06/23 1525 10/07/23 0607  WBC 16.4* 14.4*  RBC 4.50 4.00*  HCT 41.8 37.2*  PLT 196 172   Recent Labs    10/06/23 1525 10/07/23 0607  NA 138  --   K 4.5  --   CL 104  --   CO2 22  --   BUN 28*  --   CREATININE 1.43* 1.26*  GLUCOSE 251*  --   CALCIUM 8.3*  --    Recent Labs    10/06/23 1525  INR 1.1    Physical Exam: Neurologically intact Sensation intact distally Intact pulses distally Dorsiflexion/Plantar flexion intact Incision: dressing C/D/I No cellulitis present Compartment soft  Assessment/Plan: 1 Day Post-Op Procedure(s) (LRB): FIXATION, FRACTURE, INTERTROCHANTERIC, WITH INTRAMEDULLARY ROD (Left)   Advance diet Up with therapy Weight Bearing as Tolerated (WBAT) VTE prophylaxis: Lovenox  30mg  starting today per orders; Dr Hulda Mage recommended aspirin  325mg  daily- will need to reconcile which is desired prior to DC  Plan for patient to work with PT if able. WBAT LLE. Maintain dressing, reinforce PRN. Ortho will continue to follow. Patient stable for DC once he can safely ambulate with PT. Follow up in office in 2 weeks for  suture/staple removal.    Sharina Petre L. [place], PA-C 10/07/2023, 9:10 AM

## 2023-10-07 NOTE — Evaluation (Addendum)
 Physical Therapy Evaluation Patient Details Name: [name] MRN: [id number] DOB: 1931-06-02 Today's Date: 10/07/2023  History of Present Illness  Pt is an 89 y.o. male presenting with after fall at home in garage and being found next morning. Found to have L intertrochanteric hip fx. Now s/p cephalo medullary nailing of L hip fx. PMH significant for arthritis, cardiac arrythmia, HLD, HTN, R side sciatica, DMII, bil knee surgeries, L shoulder surgery.  Clinical Impression  Pt admitted with above diagnosis and presents to PT with functional limitations due to deficits listed below (See PT problem list). Pt needs skilled PT to maximize independence and safety. Pt typically independent at home alone. Will  benefit from continued inpatient follow up therapy, <3 hours/day. Pt very motivated to work toward return to independence and expect he will make steady progress.            If plan is discharge home, recommend the following: A lot of help with bathing/dressing/bathroom;A lot of help with walking and/or transfers;Assistance with cooking/housework;Assist for transportation;Help with stairs or ramp for entrance   Can travel by private vehicle   No    Equipment Recommendations None recommended by PT  Recommendations for Other Services       Functional Status Assessment Patient has had a recent decline in their functional status and demonstrates the ability to make significant improvements in function in a reasonable and predictable amount of time.     Precautions / Restrictions Precautions Precautions: Fall Restrictions Weight Bearing Restrictions Per Provider Order: Yes LLE Weight Bearing Per Provider Order: Weight bearing as tolerated      Mobility  Bed Mobility Overal bed mobility: Needs Assistance Bed Mobility: Supine to Sit     Supine to sit: Mod assist, HOB elevated     General bed mobility comments: Assist to bring legs off bed, elevate trunk into sitting, and bring  hips to EOB    Transfers Overall transfer level: Needs assistance Equipment used: Ambulation equipment used Transfers: Sit to/from Stand, Bed to chair/wheelchair/BSC Sit to Stand: +2 physical assistance, Min assist, Mod assist, From elevated surface, Via lift equipment           General transfer comment: Assist to power up to stand with Stedy. More assist required from low recliner than elevated bed and seat of stedy Transfer via Lift Equipment: Stedy  Ambulation/Gait               General Gait Details: Did not attempt  Stairs            Wheelchair Mobility     Tilt Bed    Modified Rankin (Stroke Patients Only)       Balance Overall balance assessment: Needs assistance Sitting-balance support: No upper extremity supported Sitting balance-Leahy Scale: Good     Standing balance support: Bilateral upper extremity supported Standing balance-Leahy Scale: Poor Standing balance comment: Stedy and min assist for static standing                             Pertinent Vitals/Pain Pain Assessment Pain Assessment: Faces Faces Pain Scale: Hurts even more Pain Location: lt hip with mobility Pain Descriptors / Indicators: Grimacing, Guarding, Sore Pain Intervention(s): Limited activity within patient's tolerance, Monitored during session, Repositioned    Home Living Family/patient expects to be discharged to:: Private residence Living Arrangements: Alone Available Help at Discharge: Family Type of Home: House Home Access: Stairs to enter Entrance Stairs-Rails: Right Entrance  Stairs-Number of Steps: 2 Alternate Level Stairs-Number of Steps: flight Home Layout: Two level;Able to live on main level with bedroom/bathroom Home Equipment: Rolling Walker (2 wheels);Cane - single point;Crutches;Grab bars - tub/shower;BSC/3in1;Shower seat      Prior Function Prior Level of Function : Independent/Modified Independent;Driving             Mobility  Comments: No assistive device ADLs Comments: cooks, cleans, grocery shops, does yard work     Extremity/Trunk Assessment   Upper Extremity Assessment Upper Extremity Assessment: Overall WFL for tasks assessed    Lower Extremity Assessment Lower Extremity Assessment: Defer to PT evaluation LLE Deficits / Details: Limited by post op pain       Communication   Communication Communication: No apparent difficulties Factors Affecting Communication: Hearing impaired    Cognition Arousal: Alert Behavior During Therapy: WFL for tasks assessed/performed   PT - Cognitive impairments: No apparent impairments                         Following commands: Intact       Cueing Cueing Techniques: Verbal cues, Tactile cues     General Comments General comments (skin integrity, edema, etc.): VSS on RA    Exercises     Assessment/Plan    PT Assessment Patient needs continued PT services  PT Problem List Decreased strength;Decreased activity tolerance;Decreased balance;Decreased mobility;Pain       PT Treatment Interventions DME instruction;Gait training;Functional mobility training;Therapeutic activities;Therapeutic exercise;Balance training;Patient/family education    PT Goals (Current goals can be found in the Care Plan section)  Acute Rehab PT Goals Patient Stated Goal: go home PT Goal Formulation: With patient Time For Goal Achievement: 10/21/23 Potential to Achieve Goals: Good    Frequency Min 2X/week     Co-evaluation               AM-PAC PT 6 Clicks Mobility  Outcome Measure Help needed turning from your back to your side while in a flat bed without using bedrails?: A Lot Help needed moving from lying on your back to sitting on the side of a flat bed without using bedrails?: A Lot Help needed moving to and from a bed to a chair (including a wheelchair)?: Total Help needed standing up from a chair using your arms (e.g., wheelchair or bedside  chair)?: Total Help needed to walk in hospital room?: Total Help needed climbing 3-5 steps with a railing? : Total 6 Click Score: 8    End of Session Equipment Utilized During Treatment: Gait belt Activity Tolerance: Patient tolerated treatment well Patient left: in chair;with call bell/phone within reach;with chair alarm set Nurse Communication: Mobility status;Need for lift equipment PT Visit Diagnosis: Other abnormalities of gait and mobility (R26.89);Muscle weakness (generalized) (M62.81);Pain Pain - Right/Left: Left Pain - part of body: Hip    Time: 6295-2841 PT Time Calculation (min) (ACUTE ONLY): 46 min   Charges:   PT Evaluation $PT Eval Moderate Complexity: 1 Mod PT Treatments $Therapeutic Activity: 8-22 mins PT General Charges $$ ACUTE PT VISIT: 1 Visit         [facility] PT Acute Rehabilitation Services Office [phone]   Pura Browns [facility] 10/07/2023, 5:19 PM

## 2023-10-07 NOTE — Progress Notes (Signed)
 [facility] Progress Note Patient: [name] ZOX:[id number] DOB: 12-Sep-1931 DOA: 10/06/2023  DOS: the patient was seen and examined on 10/07/2023  Brief Hospital Course: PMH of HTN, HLD, type II DM, arthritis presents to the hospital with complaints of a mechanical fall. Was found to have left intertrochanteric hip fracture. Underwent left intertrochanteric hip fracture cephalomedullary nailing on 6/14 with Dr. Hulda Mage.  Assessment and Plan: Left intertrochanteric fracture. After mechanical fall. Underwent surgery. DVT prophylaxis, weightbearing, pain management per surgery. PT OT consulted. Most likely will need SNF.  HTN. Blood pressure is stable. Monitor for now.  Type 2 diabetes mellitus. Currently on sliding scale insulin . Holding home regimen.  HLD. On statin.  Will continue.  AKI on CKD 3A Mild nontraumatic subdural myelosis. Baseline creatinine 1 on admission serum creatinine was 1.4.  Improving with IV hydration. CK level was 5800. Will recheck.  Leukocytosis. Likely stress reaction. Will monitor.  Hyperbilirubinemia. Monitor for now.  BPH. Continue home regimen.  OSA. Continue CPAP nightly.  Chronic A-fib. Not on anticoagulation.  Monitor clinically.   Subjective: No nausea no vomiting no fever no chills.  Pain well-controlled.  Physical Exam: General: in Mild distress, No Rash Cardiovascular: S1 and S2 Present, No Murmur Respiratory: Good respiratory effort, Bilateral Air entry present. No Crackles, No wheezes Abdomen: Bowel Sound present, No tenderness Extremities: No edema Neuro: Alert and oriented x3, no new focal deficit  Data Reviewed: I have Reviewed nursing notes, Vitals, and Lab results. Since last encounter, pertinent lab results CBC and BMP   . I have ordered test including CBC and BMP  .   Disposition: Status is: Inpatient Remains inpatient appropriate because: Continue postop recovery  enoxaparin  (LOVENOX ) injection 40 mg  Start: 10/08/23 1000 SCDs Start: 10/07/23 0126   Family Communication: No one at bedside Level of care: Med-Surg   Vitals:   10/07/23 0600 10/07/23 0913 10/07/23 1153 10/07/23 1713  BP: 114/75 (!) 140/72 122/76 (!) 107/58  Pulse: 93 99 (!) 108 85  Resp: 19 18    Temp: 97.8 F (36.6 C) 98 F (36.7 C) 97.7 F (36.5 C) 98.6 F (37 C)  TempSrc:   Oral Oral  SpO2: 92% 97% 98% 93%  Weight:      Height:         Author: Charlean Congress, MD 10/07/2023 7:10 PM  Please look on [email or web address] to find out who is on call.

## 2023-10-07 NOTE — Hospital Course (Signed)
 PMH of HTN, HLD, type II DM, arthritis presents to the hospital with complaints of a mechanical fall. Was found to have left intertrochanteric hip fracture. Underwent left intertrochanteric hip fracture cephalomedullary nailing on 6/14 with Dr. Hulda Mage.  Assessment and Plan: Left intertrochanteric fracture. After mechanical fall. Underwent surgery. DVT prophylaxis, weightbearing, pain management per surgery. PT OT consulted. Most likely will need SNF.  HTN. Blood pressure is stable. Monitor for now.  Type 2 diabetes mellitus. Currently on sliding scale insulin . Holding home regimen.  HLD. On statin.  Will continue.  AKI on CKD 3A Mild nontraumatic subdural myelosis. Baseline creatinine 1 on admission serum creatinine was 1.4.  Initially improved with IV hydration.  Now worsening again.  Will resume IV fluid. CK level was 5800.  Leukocytosis. Likely stress reaction. Will monitor.  Hyperbilirubinemia. Monitor for now.  BPH. Continue home regimen.  OSA. Continue CPAP nightly.  Chronic A-fib. Not on anticoagulation.  Monitor clinically.  Right fifth toe swelling. Patient reports that this injury he sustained after his recent fall. Will get an x-ray to ensure no acute abnormality.  Left great toe discoloration. Patient reports that that has been present for my whole life. For now we will monitor.  Patient denies any symptoms of pain.

## 2023-10-07 NOTE — Evaluation (Signed)
 Occupational Therapy Evaluation Patient Details Name: [name] MRN: [id number] DOB: [birth year] Today's Date: 10/07/2023   History of Present Illness   Pt is an 89 y.o. male presenting with after fall at home in garage and being found next morning. Found to have L intertrochanteric hip fx. Now s/p cephalo medullary nailing of L hip fx. PMH significant for arthritis, cardiac arrythmia, HLD, HTN, R side sciatica, DMII, bil knee surgeries, L shoulder surgery.     Clinical Impressions PTA, pt lived alone and reports being independent. Upon eval, pt with decreased strength, balance, power, and endurance. Pt needing up to max A for LB ADL and mod A+2 for transfers from lower surfaces as well as use of sara stedy. Will continue to follow. Patient will benefit from continued inpatient follow up therapy, <3 hours/day     If plan is discharge home, recommend the following:   Two people to help with bathing/dressing/bathroom;A lot of help with bathing/dressing/bathroom;Two people to help with walking and/or transfers;Assistance with cooking/housework;Help with stairs or ramp for entrance;Assist for transportation     Functional Status Assessment   Patient has had a recent decline in their functional status and demonstrates the ability to make significant improvements in function in a reasonable and predictable amount of time.     Equipment Recommendations   Other (comment) (defer)     Recommendations for Other Services         Precautions/Restrictions   Precautions Precautions: Fall Restrictions Weight Bearing Restrictions Per Provider Order: Yes LLE Weight Bearing Per Provider Order: Weight bearing as tolerated     Mobility Bed Mobility               General bed mobility comments: EOB with PT on arrival    Transfers Overall transfer level: Needs assistance Equipment used: Ambulation equipment used Transfers: Sit to/from Stand, Bed to chair/wheelchair/BSC Sit  to Stand: Min assist, +2 safety/equipment, Mod assist, +2 physical assistance, Via lift equipment, From elevated surface           General transfer comment: up from elevated EOB with min A of 2 and stool of stedy with CGA. Up from recliner chair with mod A of 2 Transfer via Lift Equipment: Stedy    Balance Overall balance assessment: Needs assistance Sitting-balance support: No upper extremity supported, Feet supported Sitting balance-Leahy Scale: Good Sitting balance - Comments: statically   Standing balance support: Bilateral upper extremity supported, During functional activity Standing balance-Leahy Scale: Poor Standing balance comment: reliant on support and noting stooped posture in standing                           ADL either performed or assessed with clinical judgement   ADL Overall ADL's : Needs assistance/impaired Eating/Feeding: Independent;Sitting   Grooming: Set up;Sitting   Upper Body Bathing: Set up;Sitting   Lower Body Bathing: Maximal assistance;Sit to/from stand   Upper Body Dressing : Set up;Sitting   Lower Body Dressing: Maximal assistance;Sit to/from stand   Toilet Transfer: Minimal assistance;+2 for safety/equipment Toilet Transfer Details (indicate cue type and reason): stedy Toileting- Clothing Manipulation and Hygiene: Total assistance;Sit to/from stand       Functional mobility during ADLs: Minimal assistance (up with stedy)       Vision Patient Visual Report: No change from baseline Additional Comments: not formally assessed     Perception Perception: Not tested       Praxis Praxis: Not tested  Pertinent Vitals/Pain Pain Assessment Pain Assessment: Faces Faces Pain Scale: Hurts even more Pain Location: hips L>R, knees Pain Descriptors / Indicators: Aching, Sore     Extremity/Trunk Assessment Upper Extremity Assessment Upper Extremity Assessment: Overall WFL for tasks assessed   Lower Extremity  Assessment Lower Extremity Assessment: Defer to PT evaluation       Communication Communication Communication: No apparent difficulties   Cognition Arousal: Alert Behavior During Therapy: WFL for tasks assessed/performed Cognition: No apparent impairments             OT - Cognition Comments: oriented, follows commands                 Following commands: Intact       Cueing  General Comments   Cueing Techniques: Verbal cues  VSS on RA   Exercises     Shoulder Instructions      Home Living Family/patient expects to be discharged to:: Private residence Living Arrangements: Alone Available Help at Discharge: Family Type of Home: House Home Access: Stairs to enter Secretary/administrator of Steps: 2 Entrance Stairs-Rails: Right Home Layout: Two level;Able to live on main level with bedroom/bathroom     Bathroom Shower/Tub: Tub/shower unit;Walk-in shower   Bathroom Toilet: Handicapped height     Home Equipment: Agricultural consultant (2 wheels);Cane - single point;Crutches;Grab bars - tub/shower;BSC/3in1;Shower seat          Prior Functioning/Environment Prior Level of Function : Independent/Modified Independent;Driving             Mobility Comments: No assistive device ADLs Comments: cooks, cleans, grocery shops, does yard work    OT Problem List: Decreased strength;Decreased activity tolerance;Impaired balance (sitting and/or standing);Decreased safety awareness;Decreased knowledge of use of DME or AE;Pain   OT Treatment/Interventions: Self-care/ADL training;Therapeutic exercise;DME and/or AE instruction;Therapeutic activities;Patient/family education;Balance training      OT Goals(Current goals can be found in the care plan section)   Acute Rehab OT Goals Patient Stated Goal: get better OT Goal Formulation: With patient Time For Goal Achievement: 10/21/23 Potential to Achieve Goals: Good   OT Frequency:  Min 2X/week    Co-evaluation               AM-PAC OT 6 Clicks Daily Activity     Outcome Measure Help from another person eating meals?: None Help from another person taking care of personal grooming?: A Little Help from another person toileting, which includes using toliet, bedpan, or urinal?: A Lot Help from another person bathing (including washing, rinsing, drying)?: A Lot Help from another person to put on and taking off regular upper body clothing?: A Little Help from another person to put on and taking off regular lower body clothing?: A Lot 6 Click Score: 16   End of Session Equipment Utilized During Treatment: Gait belt;Other (comment) (stedy) Nurse Communication: Mobility status;Other (comment) (skin tear R arm; RN in room to apply mepilex at end of session)  Activity Tolerance: Patient tolerated treatment well Patient left: in chair;with call bell/phone within reach;with chair alarm set  OT Visit Diagnosis: Unsteadiness on feet (R26.81);Muscle weakness (generalized) (M62.81);History of falling (Z91.81);Pain                Time: 1555-1620 OT Time Calculation (min): 25 min Charges:  OT General Charges $OT Visit: 1 Visit OT Evaluation $OT Eval Moderate Complexity: 1 Mod  Karilyn Ouch, OTR/L [facility] Acute Rehabilitation Office: [phone]   Emery Hans 10/07/2023, 5:14 PM

## 2023-10-08 ENCOUNTER — Inpatient Hospital Stay (HOSPITAL_COMMUNITY)

## 2023-10-08 ENCOUNTER — Other Ambulatory Visit: Payer: Self-pay

## 2023-10-08 DIAGNOSIS — S72002A Fracture of unspecified part of neck of left femur, initial encounter for closed fracture: Secondary | ICD-10-CM | POA: Diagnosis not present

## 2023-10-08 LAB — URINE CULTURE

## 2023-10-08 LAB — CBC
HCT: 34.7 % — ABNORMAL LOW (ref 39.0–52.0)
Hemoglobin: 11.3 g/dL — ABNORMAL LOW (ref 13.0–17.0)
MCH: 30.5 pg (ref 26.0–34.0)
MCHC: 32.6 g/dL (ref 30.0–36.0)
MCV: 93.8 fL (ref 80.0–100.0)
Platelets: 155 10*3/uL (ref 150–400)
RBC: 3.7 MIL/uL — ABNORMAL LOW (ref 4.22–5.81)
RDW: 13.2 % (ref 11.5–15.5)
WBC: 11 10*3/uL — ABNORMAL HIGH (ref 4.0–10.5)
nRBC: 0 % (ref 0.0–0.2)

## 2023-10-08 LAB — BASIC METABOLIC PANEL WITH GFR
Anion gap: 10 (ref 5–15)
BUN: 28 mg/dL — ABNORMAL HIGH (ref 8–23)
CO2: 24 mmol/L (ref 22–32)
Calcium: 7.9 mg/dL — ABNORMAL LOW (ref 8.9–10.3)
Chloride: 104 mmol/L (ref 98–111)
Creatinine, Ser: 1.37 mg/dL — ABNORMAL HIGH (ref 0.61–1.24)
GFR, Estimated: 49 mL/min — ABNORMAL LOW (ref >60)
Glucose, Bld: 160 mg/dL — ABNORMAL HIGH (ref 70–99)
Potassium: 4.1 mmol/L (ref 3.5–5.1)
Sodium: 138 mmol/L (ref 135–145)

## 2023-10-08 LAB — MAGNESIUM: Magnesium: 1.8 mg/dL (ref 1.7–2.4)

## 2023-10-08 LAB — GLUCOSE, CAPILLARY
Glucose-Capillary: 201 mg/dL — ABNORMAL HIGH (ref 70–99)
Glucose-Capillary: 155 mg/dL — ABNORMAL HIGH (ref 70–99)
Glucose-Capillary: 191 mg/dL — ABNORMAL HIGH (ref 70–99)
Glucose-Capillary: 135 mg/dL — ABNORMAL HIGH (ref 70–99)
Glucose-Capillary: 163 mg/dL — ABNORMAL HIGH (ref 70–99)

## 2023-10-08 LAB — CK: Total CK: 1099 U/L — ABNORMAL HIGH (ref 49–397)

## 2023-10-08 MED ORDER — FOSFOMYCIN TROMETHAMINE 3 G PO PACK
3.0000 g | PACK | Freq: Once | ORAL | Status: AC
  Administered 2023-10-08: 3 g via ORAL
  Filled 2023-10-08: qty 3

## 2023-10-08 MED ORDER — SODIUM CHLORIDE 0.9 % IV SOLN: INTRAVENOUS | Status: DC

## 2023-10-08 MED ORDER — ACETAMINOPHEN 500 MG PO TABS
500.0000 mg | ORAL_TABLET | Freq: Four times a day (QID) | ORAL | Status: DC
  Administered 2023-10-08 – 2023-10-10 (×10): 500 mg via ORAL
  Filled 2023-10-08 (×12): qty 1

## 2023-10-08 MED ORDER — DOCUSATE SODIUM 100 MG PO CAPS
100.0000 mg | ORAL_CAPSULE | Freq: Every day | ORAL | Status: DC
  Administered 2023-10-10: 100 mg via ORAL
  Filled 2023-10-08 (×3): qty 1

## 2023-10-08 NOTE — Progress Notes (Signed)
 [facility] Progress Note Patient: [name] ZOX:[id number] DOB: 03-22-1932 DOA: 10/06/2023  DOS: the patient was seen and examined on 10/08/2023  Brief Hospital Course: PMH of HTN, HLD, type II DM, arthritis presents to the hospital with complaints of a mechanical fall. Was found to have left intertrochanteric hip fracture. Underwent left intertrochanteric hip fracture cephalomedullary nailing on 6/14 with Dr. Hulda Mage.  Assessment and Plan: Left intertrochanteric fracture. After mechanical fall. Underwent surgery. DVT prophylaxis, weightbearing, pain management per surgery. PT OT consulted. Most likely will need SNF.  HTN. Blood pressure is stable. Monitor for now.  Type 2 diabetes mellitus. Currently on sliding scale insulin . Holding home regimen.  HLD. On statin.  Will continue.  AKI on CKD 3A Mild nontraumatic subdural myelosis. Baseline creatinine 1 on admission serum creatinine was 1.4.  Initially improved with IV hydration.  Now worsening again.  Will resume IV fluid. CK level was 5800.  Leukocytosis. Likely stress reaction. Will monitor.  Hyperbilirubinemia. Monitor for now.  BPH. Continue home regimen.  OSA. Continue CPAP nightly.  Chronic A-fib. Not on anticoagulation.  Monitor clinically.  Right fifth toe swelling. Patient reports that this injury he sustained after his recent fall. Will get an x-ray to ensure no acute abnormality.  Left great toe discoloration. Patient reports that that has been present for my whole life. For now we will monitor.  Patient denies any symptoms of pain.   Subjective: No nausea no vomiting no fever no chills.  Pain well-controlled.  No diarrhea  Physical Exam: General: in Mild distress, No Rash Cardiovascular: S1 and S2 Present, No Murmur Respiratory: Good respiratory effort, Bilateral Air entry present. No Crackles, No wheezes Abdomen: Bowel Sound present, No tenderness Extremities: No edema, erythema of  the right fifth toe and second toe with swelling left great toe black discoloration Neuro: Alert and oriented x3, no new focal deficit  Data Reviewed: I have Reviewed nursing notes, Vitals, and Lab results. Since last encounter, pertinent lab results CBC and BMP   . I have ordered test including CBC BMP CK  . I have ordered imaging x-ray right foot  .   Disposition: Status is: Inpatient Remains inpatient appropriate because: Monitor for improvement in renal function  enoxaparin  (LOVENOX ) injection 40 mg Start: 10/08/23 1000 SCDs Start: 10/07/23 0126   Family Communication: No one at bedside Level of care: Med-Surg   Vitals:   10/08/23 0526 10/08/23 0725 10/08/23 0947 10/08/23 1040  BP: (!) 136/94 127/75    Pulse: 87 94    Resp: 17 20 20 16   Temp: 97.8 F (36.6 C) 98 F (36.7 C)    TempSrc:  Oral    SpO2: 96% 100% 96%   Weight:   92.2 kg   Height:   5' 10.98 (1.803 m)      Author: Charlean Congress, MD 10/08/2023 2:15 PM  Please look on [email or web address] to find out who is on call.

## 2023-10-08 NOTE — NC FL2 (Signed)
 [place]  MEDICAID FL2 LEVEL OF CARE FORM     IDENTIFICATION  Patient Name: [name] Birthdate: [birth year] Sex: male Admission Date (Current Location): 10/06/2023  [facility] and [place] Number:  Producer, television/film/video and Address:  The . [facility], [address], [place], [place] [address]      Provider Number: [id number]  Attending Physician Name and Address:  Kraig Peru, MD  Relative Name and Phone Number:  Amit Leece; Son; [phone]    Current Level of Care: Hospital Recommended Level of Care: Skilled Nursing Facility Prior Approval Number:    Date Approved/Denied:   PASRR Number: [id number]  Discharge Plan: SNF    Current Diagnoses: Patient Active Problem List   Diagnosis Date Noted   Fall 10/06/2023   Closed 2-part intertrochanteric fracture of left femur (HCC) 10/06/2023   Rhabdomyolysis 10/06/2023   AKI (acute kidney injury) (HCC) 10/06/2023   BPH with urinary obstruction 03/05/2019   Excessive daytime sleepiness 01/26/2016   OSA (obstructive sleep apnea) 01/26/2016   Snoring 12/29/2015   Other cardiac arrhythmia 12/29/2015   Diabetes mellitus (HCC)    Hypertension    Hypercholesteremia    Left knee DJD    S/P total knee arthroplasty 05/13/2007    Orientation RESPIRATION BLADDER Height & Weight     Self, Time, Situation, Place  Normal (Room Air) Incontinent, External catheter Weight: 203 lb (92.1 kg) Height:  5' 11 (180.3 cm)  BEHAVIORAL SYMPTOMS/MOOD NEUROLOGICAL BOWEL NUTRITION STATUS      Incontinent Diet (Please see discharge summary)  AMBULATORY STATUS COMMUNICATION OF NEEDS Skin   Extensive Assist Verbally Other (Comment), Surgical wounds (Wound 10/06/23 Thigh Anterior;Right; Wound 10/06/23 Knee Anterior;Right; Wound 10/06/23 Knee Anterior;Left; Wound 10/06/23 Closed Surgical Incision Thigh Left; Wound 10/07/23 Foot Anterior;Left;Medial)                       Personal Care Assistance Level of Assistance   Bathing, Dressing, Feeding Bathing Assistance: Maximum assistance Feeding assistance: Maximum assistance Dressing Assistance: Maximum assistance     Functional Limitations Info  Sight, Hearing Sight Info: Impaired (R and L (Eyeglasses)) Hearing Info: Impaired (R and L)      SPECIAL CARE FACTORS FREQUENCY  PT (By licensed PT), OT (By licensed OT)     PT Frequency: 5x OT Frequency: 5x            Contractures Contractures Info: Not present    Additional Factors Info  Code Status, Allergies, Insulin  Sliding Scale Code Status Info: Full Code Allergies Info: NKA   Insulin  Sliding Scale Info: Please see discharge summary       Current Medications (10/08/2023):  This is the current hospital active medication list Current Facility-Administered Medications  Medication Dose Route Frequency Provider Last Rate Last Admin   acetaminophen  (TYLENOL ) tablet 325-650 mg  325-650 mg Oral Q6H PRN [place], Jesse J, PA-C       acetaminophen  (TYLENOL ) tablet 500 mg  500 mg Oral Q6H Patel, Pranav M, MD       chlorhexidine  (PERIDEX ) 0.12 % solution 15 mL  15 mL Mouth/Throat BID Patel, Pranav M, MD   15 mL at 10/07/23 2256   Chlorhexidine  Gluconate Cloth 2 % PADS 6 each  6 each Topical Daily Davida Espy, MD   6 each at 10/07/23 1158   enoxaparin  (LOVENOX ) injection 40 mg  40 mg Subcutaneous Daily [place], Amber, PA-C       HYDROcodone -acetaminophen  (NORCO) 7.5-325 MG per tablet 1  tablet  1 tablet Oral Q4H PRN [place], Jesse J, PA-C       HYDROcodone -acetaminophen  (NORCO/VICODIN) 5-325 MG per tablet 1 tablet  1 tablet Oral Q4H PRN [place], Jesse J, PA-C   1 tablet at 10/07/23 2258   insulin  aspart (novoLOG ) injection 0-15 Units  0-15 Units Subcutaneous TID WC Jordan, Jesse J, PA-C   2 Units at 10/07/23 1709   insulin  aspart (novoLOG ) injection 0-5 Units  0-5 Units Subcutaneous QHS [place], Jesse J, PA-C       mirabegron ER [facility]) tablet 50 mg  50 mg Oral QHS Patel, Pranav M, MD   50 mg  at 10/07/23 2258   mupirocin  ointment (BACTROBAN ) 2 % 1 Application  1 Application Nasal BID Garba, Mohammad L, MD   1 Application at 10/07/23 2258   ondansetron  (ZOFRAN ) tablet 4 mg  4 mg Oral Q6H PRN [place], Jesse J, PA-C       Or   ondansetron  (ZOFRAN ) injection 4 mg  4 mg Intravenous Q6H PRN [place], Jesse J, PA-C       phenol (CHLORASEPTIC) mouth spray 1 spray  1 spray Mouth/Throat PRN Orren Blades, RN   1 spray at 10/07/23 1410   senna-docusate (Senokot-S) tablet 1 tablet  1 tablet Oral BID Patel, Pranav M, MD   1 tablet at 10/07/23 2257   simvastatin  (ZOCOR ) tablet 20 mg  20 mg Oral QHS Patel, Pranav M, MD   20 mg at 10/07/23 2258     Discharge Medications: Please see discharge summary for a list of discharge medications.  Relevant Imaging Results:  Relevant Lab Results:   Additional Information SS# [id number]  Juliane Och, LCSW

## 2023-10-08 NOTE — TOC CAGE-AID Note (Signed)
 Transition of Care [facility]) - CAGE-AID Screening   Patient Details  Name: [name] MRN: [id number] Date of Birth: 1932/02/20  [facility]) CM/SW Contact:    Philis Doke E Jeryl Wilbourn, LCSW Phone Number: 10/08/2023, 9:57 AM   Clinical Narrative: Memory impairment. No SA noted per chart review.   CAGE-AID Screening: Substance Abuse Screening unable to be completed due to: : Patient unable to participate

## 2023-10-08 NOTE — Plan of Care (Signed)
   Problem: Education: Goal: Individualized Educational Video(s) Outcome: Progressing   Problem: Coping: Goal: Ability to adjust to condition or change in health will improve Outcome: Progressing   Problem: Fluid Volume: Goal: Ability to maintain a balanced intake and output will improve Outcome: Progressing   Problem: Health Behavior/Discharge Planning: Goal: Ability to identify and utilize available resources and services will improve Outcome: Progressing Goal: Ability to manage health-related needs will improve Outcome: Progressing   Problem: Metabolic: Goal: Ability to maintain appropriate glucose levels will improve Outcome: Progressing   Problem: Nutritional: Goal: Maintenance of adequate nutrition will improve Outcome: Progressing Goal: Progress toward achieving an optimal weight will improve Outcome: Progressing   Problem: Skin Integrity: Goal: Risk for impaired skin integrity will decrease Outcome: Progressing   Problem: Tissue Perfusion: Goal: Adequacy of tissue perfusion will improve Outcome: Progressing

## 2023-10-08 NOTE — TOC Initial Note (Addendum)
 Transition of Care [facility]) - Initial/Assessment Note    Patient Details  Name: [name] MRN: [id number] Date of Birth: 1931-10-04  [facility]) CM/SW Contact:    Juliane Och, LCSW Phone Number: 10/08/2023, 9:19 AM  Clinical Narrative:                  9:19 AM CSW introduced self and role to patient's step-daughter, [name], and patient's son, [name] (patient has memory impairment). CSW informed [name] and [name] of physical therapy's recommendation of patient discharging to SNF. [name] and [name] were agreeable to recommendation and consented CSW to send referrals to SNFs within [facility]. [name] expressed preference in [facility]. Per chart review, patient has HH history with [facility]. Patient has rolling walker, 3in1 and CPM at home. Patient resided at home alone prior to admission. Patient has a PCP and insurance. Patient's preferred pharmacy is [facility].  Expected Discharge Plan: Skilled Nursing Facility Barriers to Discharge: Continued Medical Work up, SNF Pending bed offer, Insurance Authorization   Patient Goals and CMS Choice            Expected Discharge Plan and Services In-house Referral: Clinical Social Work   Post Acute Care Choice: Skilled Nursing Facility Living arrangements for the past 2 months: Single Family Home                                      Prior Living Arrangements/Services Living arrangements for the past 2 months: Single Family Home Lives with:: Self Patient language and need for interpreter reviewed:: Yes        Need for Family Participation in Patient Care: Yes (Comment) Care giver support system in place?: Yes (comment)   Criminal Activity/Legal Involvement Pertinent to Current Situation/Hospitalization: No - Comment as needed  Activities of Daily Living      Permission Sought/Granted Permission sought to share information with : Facility Medical sales representative, Family  Supports Permission granted to share information with : No (Contact information on chart)  Share Information with NAME: [name]  Permission granted to share info w AGENCY: SNF  Permission granted to share info w Relationship: Son  Permission granted to share info w Contact Information: [phone]  Emotional Assessment Appearance:: Appears stated age Attitude/Demeanor/Rapport: Unable to Assess Affect (typically observed): Unable to Assess Orientation: : Oriented to Self, Oriented to Place, Oriented to  Time, Oriented to Situation Alcohol / Substance Use: Not Applicable Psych Involvement: No (comment)  Admission diagnosis:  Fall [W19.XXXA] Closed fracture of left hip, initial encounter (HCC) [S72.002A] Fall, initial encounter [W19.XXXA] Traumatic rhabdomyolysis, initial encounter (HCC) [T79.6XXA] Patient Active Problem List   Diagnosis Date Noted   Fall 10/06/2023   Closed 2-part intertrochanteric fracture of left femur (HCC) 10/06/2023   Rhabdomyolysis 10/06/2023   AKI (acute kidney injury) (HCC) 10/06/2023   BPH with urinary obstruction 03/05/2019   Excessive daytime sleepiness 01/26/2016   OSA (obstructive sleep apnea) 01/26/2016   Snoring 12/29/2015   Other cardiac arrhythmia 12/29/2015   Diabetes mellitus (HCC)    Hypertension    Hypercholesteremia    Left knee DJD    S/P total knee arthroplasty 05/13/2007   PCP:  Lonzie Robins, MD Pharmacy:   [id number], [place] - [address] [place] [address] Phone: [phone] Fax: [phone]     Social Drivers of  Health (SDOH) Social History: SDOH Screenings   Food Insecurity: No Food Insecurity (10/08/2023)  Housing: Low Risk  (10/08/2023)  Transportation Needs: No Transportation Needs (10/08/2023)  Utilities: Not At Risk (10/08/2023)  Social Connections: Moderately Integrated (10/08/2023)  Tobacco Use: Medium Risk (10/06/2023)   SDOH  Interventions:     Readmission Risk Interventions     No data to display

## 2023-10-08 NOTE — TOC Progression Note (Addendum)
 Transition of Care [name]) - Progression Note    Patient Details  Name: [name] MRN: [id number] Date of Birth: 07/19/1931  Transition of Care [facility]) CM/SW Contact  Elspeth Hals, LCSW Phone Number: 10/08/2023, 2:45 PM  Clinical Narrative:   Bed offers provided to pt and daughter [name] on medicare choice document.  Pt also spoke with son [name].  They will review.    1515: TC [name], son: they want to accept offer at [facility].  Darian/Ashton informed.  Unclear DC date.  TOC will continue to follow.    Expected Discharge Plan: Skilled Nursing Facility Barriers to Discharge: Continued Medical Work up, SNF Pending bed offer, English as a second language teacher  Expected Discharge Plan and Services In-house Referral: Clinical Social Work   Post Acute Care Choice: Skilled Nursing Facility Living arrangements for the past 2 months: Single Family Home                                       Social Determinants of Health (SDOH) Interventions SDOH Screenings   Food Insecurity: No Food Insecurity (10/08/2023)  Housing: Low Risk  (10/08/2023)  Transportation Needs: No Transportation Needs (10/08/2023)  Utilities: Not At Risk (10/08/2023)  Social Connections: Moderately Integrated (10/08/2023)  Tobacco Use: Medium Risk (10/06/2023)    Readmission Risk Interventions     No data to display

## 2023-10-08 NOTE — Progress Notes (Signed)
     [name] is a 89 y.o. male   Orthopaedic diagnosis: Status post cephalomedullary nailing of left hip fracture 10/06/2023  Subjective: Patient resting comfortably.  He reports pain is well-controlled at rest.  He is mostly taking Tylenol .  Will likely benefit from SNF placement.  Hemoglobin stable this a.m.  Objectyive: Vitals:   10/08/23 0526 10/08/23 0725  BP: (!) 136/94 127/75  Pulse: 87 94  Resp: 17 20  Temp: 97.8 F (36.6 C) 98 F (36.7 C)  SpO2: 96% 100%     Exam: Awake and alert Respirations even and unlabored No acute distress  Left hip with clean, dry, and intact dressing.  Mild swelling.  Well aligned clinically.  Intact plantarflexion dorsiflexion at the ankle.  Warm and well-perfused distally with intact sensation.  Calves are soft and nontender.  Assessment: Postop day 2 to status post above, doing well   Plan: Patient doing well.  He will likely benefit from SNF placement. - Weightbearing as tolerated left lower extremity with walker - Keep dressing clean, dry, and intact - Pain control as needed.  He is not requiring much hydrocodone .  Will DC IV narcotics. - Lovenox  for DVT prophylaxis while inpatient.  Plan for 325 aspirin  daily x 30 days for DVT prophylaxis outpatient.  Plan for outpatient follow-up 2 weeks from surgery with Dr. Hulda Mage for radiographs of the operative hip and likely staple removal if appropriate.   Seldon Barrell J. [place], PA-C

## 2023-10-09 ENCOUNTER — Encounter (HOSPITAL_COMMUNITY): Payer: Self-pay | Admitting: Orthopaedic Surgery

## 2023-10-09 DIAGNOSIS — S72002A Fracture of unspecified part of neck of left femur, initial encounter for closed fracture: Secondary | ICD-10-CM | POA: Diagnosis not present

## 2023-10-09 LAB — BASIC METABOLIC PANEL WITH GFR
Anion gap: 8 (ref 5–15)
BUN: 26 mg/dL — ABNORMAL HIGH (ref 8–23)
CO2: 23 mmol/L (ref 22–32)
Calcium: 7.7 mg/dL — ABNORMAL LOW (ref 8.9–10.3)
Chloride: 106 mmol/L (ref 98–111)
Creatinine, Ser: 1.16 mg/dL (ref 0.61–1.24)
GFR, Estimated: >60 mL/min (ref >60)
Glucose, Bld: 162 mg/dL — ABNORMAL HIGH (ref 70–99)
Potassium: 4.1 mmol/L (ref 3.5–5.1)
Sodium: 137 mmol/L (ref 135–145)

## 2023-10-09 LAB — CBC
HCT: 34.7 % — ABNORMAL LOW (ref 39.0–52.0)
Hemoglobin: 11.2 g/dL — ABNORMAL LOW (ref 13.0–17.0)
MCH: 30.2 pg (ref 26.0–34.0)
MCHC: 32.3 g/dL (ref 30.0–36.0)
MCV: 93.5 fL (ref 80.0–100.0)
Platelets: 150 10*3/uL (ref 150–400)
RBC: 3.71 MIL/uL — ABNORMAL LOW (ref 4.22–5.81)
RDW: 13.2 % (ref 11.5–15.5)
WBC: 8.5 10*3/uL (ref 4.0–10.5)
nRBC: 0 % (ref 0.0–0.2)

## 2023-10-09 LAB — GLUCOSE, CAPILLARY
Glucose-Capillary: 167 mg/dL — ABNORMAL HIGH (ref 70–99)
Glucose-Capillary: 207 mg/dL — ABNORMAL HIGH (ref 70–99)
Glucose-Capillary: 138 mg/dL — ABNORMAL HIGH (ref 70–99)
Glucose-Capillary: 157 mg/dL — ABNORMAL HIGH (ref 70–99)

## 2023-10-09 LAB — MAGNESIUM: Magnesium: 1.8 mg/dL (ref 1.7–2.4)

## 2023-10-09 LAB — CK: Total CK: 510 U/L — ABNORMAL HIGH (ref 49–397)

## 2023-10-09 MED ORDER — HYDROCODONE-ACETAMINOPHEN 5-325 MG PO TABS: 1.0000 | ORAL_TABLET | Freq: Four times a day (QID) | ORAL | 0 refills | Status: DC | PRN

## 2023-10-09 MED ORDER — ASPIRIN 325 MG PO TABS: ORAL_TABLET | ORAL | 0 refills | Status: DC

## 2023-10-09 NOTE — Plan of Care (Signed)
  Problem: Metabolic: Goal: Ability to maintain appropriate glucose levels will improve Outcome: Progressing   Problem: Nutritional: Goal: Maintenance of adequate nutrition will improve Outcome: Progressing   Problem: Skin Integrity: Goal: Risk for impaired skin integrity will decrease Outcome: Progressing   

## 2023-10-09 NOTE — TOC Progression Note (Signed)
 Transition of Care [facility]) - Progression Note    Patient Details  Name: [name] MRN: [id number] Date of Birth: 12/05/1931  Transition of Care [facility]) CM/SW Contact  Tandy Fam, [place] Phone Number: 10/09/2023, 4:20 PM  Clinical Narrative:   CSW updated by MD that patient may be ready for discharge soon. CSW spoke with stepdaughter, Leighton Punches, and son/HCPOA [name], to confirm that plan is for [employer], and CSW contacted CMA to request insurance authorization. CSW to follow.    Expected Discharge Plan: Skilled Nursing Facility Barriers to Discharge: Continued Medical Work up, English as a second language teacher  Expected Discharge Plan and Services In-house Referral: Clinical Social Work   Post Acute Care Choice: Skilled Nursing Facility Living arrangements for the past 2 months: Single Family Home                                       Social Determinants of Health (SDOH) Interventions SDOH Screenings   Food Insecurity: No Food Insecurity (10/08/2023)  Housing: Low Risk  (10/08/2023)  Transportation Needs: No Transportation Needs (10/08/2023)  Utilities: Not At Risk (10/08/2023)  Social Connections: Moderately Integrated (10/08/2023)  Tobacco Use: Medium Risk (10/06/2023)    Readmission Risk Interventions     No data to display

## 2023-10-09 NOTE — Progress Notes (Signed)
     [name] is a 89 y.o. male   Orthopaedic diagnosis:Status post cephalomedullary nailing of left hip fracture 10/06/2023   Subjective: Patient resting comfortably.  Reports pain is well-controlled.  He is mostly taking Tylenol .  He has worked with therapies.  Planning for SNF placement at discharge.  Objectyive: Vitals:   10/09/23 0555 10/09/23 0843  BP:  127/63  Pulse:  (!) 108  Resp:  18  Temp: 97.9 F (36.6 C) 98.4 F (36.9 C)  SpO2:  94%     Exam: Awake and alert Respirations even and unlabored No acute distress  Left hip with clean, dry, and intact dressing.  Mild swelling.  Well aligned clinically.  Intact plantarflexion dorsiflexion at the ankle.  Warm and well-perfused distally with intact sensation.  Calves are soft and nontender.   Assessment: Postop day 3 status post above, doing well  Plan: Patient doing well.  Suitable for discharge once placement obtained. - Weightbearing as tolerated left lower extremity with walker - Keep splint clean, dry, and intact. - Pain control as needed.  Small prescription of hydrocodone  printed for SNF and placed in chart. - Lovenox  for DVT prophylaxis while inpatient.  Transition to 325 mg aspirin  daily x 30 days for DVT prophylaxis outpatient.  Prescription printed.  We will plan to revisit the patient in the outpatient setting 2 weeks from surgery with Dr. Hulda Mage for radiographs of the operative hip and likely staple removal if appropriate.   Johnny Latu J. [place], PA-C

## 2023-10-09 NOTE — Anesthesia Postprocedure Evaluation (Signed)
 Anesthesia Post Note  Patient: [name]  Procedure(s) Performed: FIXATION, FRACTURE, INTERTROCHANTERIC, WITH INTRAMEDULLARY ROD (Left: Leg Upper)     Anesthesia Type: General Anesthetic complications: no   No notable events documented.  Last Vitals:  Vitals:   10/09/23 0843 10/09/23 1527  BP: 127/63 132/84  Pulse: (!) 108 75  Resp: 18 18  Temp: 36.9 C (!) 36.3 C  SpO2: 94% 94%    Last Pain:  Vitals:   10/09/23 2022  TempSrc:   PainSc: 0-No pain                 Lethaniel Rave

## 2023-10-09 NOTE — Progress Notes (Signed)
 Physical Therapy Treatment Patient Details Name: [name] MRN: [id number] DOB: [birth year] Today's Date: 10/09/2023   History of Present Illness Pt is an 89 y.o. male presenting with after fall at home in garage and being found next morning. Found to have L intertrochanteric hip fx. Now s/p cephalo medullary nailing of L hip fx. PMH significant for arthritis, cardiac arrythmia, HLD, HTN, R side sciatica, DMII, bil knee surgeries, L shoulder surgery.    PT Comments  Pt received in chair, step daughter present. Pt very agreeable to working with therapy but with noted confusion throughout about goals and why we're making him do things the hard way, ie getting up with RW and not just using the stedy. Needed verbal and tactile cues for problem solving and sequencing. Max A needed to stand to RW from recliner. Pt unable to step feet in standing. Stood to stedy with mod A and from flaps of stedy with min A. Pt performed LLE there ex in sitting. Very fatigued after session, returned to supine. Patient will benefit from continued inpatient follow up therapy, <3 hours/day. Follow     If plan is discharge home, recommend the following: A lot of help with bathing/dressing/bathroom;A lot of help with walking and/or transfers;Assistance with cooking/housework;Assist for transportation;Help with stairs or ramp for entrance   Can travel by private vehicle     No  Equipment Recommendations  None recommended by PT    Recommendations for Other Services       Precautions / Restrictions Precautions Precautions: Fall Restrictions Weight Bearing Restrictions Per Provider Order: Yes LLE Weight Bearing Per Provider Order: Weight bearing as tolerated     Mobility  Bed Mobility Overal bed mobility: Needs Assistance Bed Mobility: Sit to Supine       Sit to supine: Mod assist   General bed mobility comments: unable to lift LE's into bed, needed mod A. When first cued to lie down to L went straight back  onto back. Mod A to come back up and then mod A needed to guide trunk down to L    Transfers Overall transfer level: Needs assistance Equipment used: Ambulation equipment used, Rolling walker (2 wheels) Transfers: Sit to/from Stand, Bed to chair/wheelchair/BSC Sit to Stand: Mod assist, Via lift equipment, Max assist           General transfer comment: worked on sit>stand from Medical illustrator to [employer]. Needed max A from this low surface. With stedy, was able to stand with mod A. Stedy used to pivot back to bed as pt unable to step feet in standing with RW Transfer via Lift Equipment: Stedy  Ambulation/Gait               General Gait Details: unable to step feet in standing, cannot shift wt onto LLE and step RLE   Optometrist     Tilt Bed    Modified Rankin (Stroke Patients Only)       Balance Overall balance assessment: Needs assistance Sitting-balance support: No upper extremity supported, Feet supported Sitting balance-Leahy Scale: Fair     Standing balance support: Bilateral upper extremity supported, During functional activity Standing balance-Leahy Scale: Poor Standing balance comment: reliant on support and external assist                            Communication Communication Communication: No apparent difficulties Factors Affecting Communication:  Hearing impaired  Cognition Arousal: Alert Behavior During Therapy: WFL for tasks assessed/performed   PT - Cognitive impairments: Problem solving, Safety/Judgement, Memory                       PT - Cognition Comments: pt had difficulty sequencing mvmts, needed tactile cues with verbal cues. Repeated self several times. Not following all commands Following commands: Impaired Following commands impaired: Follows multi-step commands inconsistently, Follows multi-step commands with increased time    Cueing Cueing Techniques: Verbal cues  Exercises General  Exercises - Lower Extremity Ankle Circles/Pumps: AROM, Both, 10 reps, Seated Quad Sets: AROM, Both, 10 reps, Seated Long Arc Quad: AROM, Left, 5 reps, Seated (with 3 sec hold) Hip ABduction/ADduction: AAROM, Left, 10 reps, Seated Straight Leg Raises: AAROM, Left, 5 reps, Seated Hip Flexion/Marching: AROM, Both, 5 reps, Seated    General Comments General comments (skin integrity, edema, etc.): pt fatigued after session      Pertinent Vitals/Pain Pain Assessment Pain Assessment: 0-10 Pain Score: 9  Pain Location: L hip and back Pain Descriptors / Indicators: Aching, Sore Pain Intervention(s): Limited activity within patient's tolerance, Monitored during session    Home Living                          Prior Function            PT Goals (current goals can now be found in the care plan section) Acute Rehab PT Goals Patient Stated Goal: go home PT Goal Formulation: With patient Time For Goal Achievement: 10/21/23 Potential to Achieve Goals: Good Progress towards PT goals: Progressing toward goals    Frequency    Min 2X/week      PT Plan      Co-evaluation              AM-PAC PT 6 Clicks Mobility   Outcome Measure  Help needed turning from your back to your side while in a flat bed without using bedrails?: A Lot Help needed moving from lying on your back to sitting on the side of a flat bed without using bedrails?: A Lot Help needed moving to and from a bed to a chair (including a wheelchair)?: Total Help needed standing up from a chair using your arms (e.g., wheelchair or bedside chair)?: Total Help needed to walk in hospital room?: Total Help needed climbing 3-5 steps with a railing? : Total 6 Click Score: 8    End of Session Equipment Utilized During Treatment: Gait belt Activity Tolerance: Patient tolerated treatment well Patient left: with call bell/phone within reach;in bed;with bed alarm set Nurse Communication: Mobility status;Need for  lift equipment PT Visit Diagnosis: Other abnormalities of gait and mobility (R26.89);Muscle weakness (generalized) (M62.81);Pain Pain - Right/Left: Left Pain - part of body: Hip     Time: 9811-9147 PT Time Calculation (min) (ACUTE ONLY): 37 min  Charges:    $Therapeutic Exercise: 8-22 mins $Therapeutic Activity: 8-22 mins PT General Charges $$ ACUTE PT VISIT: 1 Visit                     Amey Ka, PT  Acute Rehab Services Secure chat preferred Office [phone]    Deloris Fetters Pleas Carneal 10/09/2023, 3:17 PM

## 2023-10-09 NOTE — Progress Notes (Signed)
 [facility] Progress Note Patient: [name] ZOX:[id number] DOB: 03/12/1932 DOA: 10/06/2023  DOS: the patient was seen and examined on 10/09/2023  Brief Hospital Course: PMH of HTN, HLD, type II DM, arthritis presents to the hospital with complaints of a mechanical fall. Was found to have left intertrochanteric hip fracture. Underwent left intertrochanteric hip fracture cephalomedullary nailing on 6/14 with Dr. Hulda Mage.  Assessment and Plan: Left intertrochanteric fracture. After mechanical fall. Underwent surgery on 6/14 with Dr. Hulda Mage DVT prophylaxis, weightbearing, pain management per surgery. PT OT consulted. SNF recommended.  HTN. Blood pressure is stable. Monitor for now.  Type 2 diabetes mellitus.  Uncontrolled with hyperglycemia without long-term insulin  use On metformin  at home.  Hemoglobin A1c 8.2. Currently on sliding scale insulin . Holding home regimen.  HLD. On statin.  Will continue.  AKI on CKD 3A Mild nontraumatic rhabdomyolysis. Baseline creatinine 1 on admission serum creatinine was 1.4.  Initially improved with IV hydration.  Then worsened again and then again improved with IV hydration.  Will hold IV fluid and monitor. CK level was 5800.  Leukocytosis. Likely stress reaction. Will monitor.  Hyperbilirubinemia. Monitor for now.  BPH. Continue home regimen.  OSA. Continue CPAP nightly.  Chronic A-fib. Not on anticoagulation.  Monitor clinically.  Right fifth toe swelling. Patient reports that this injury he sustained after his recent fall. X-ray negative for any acute abnormality.  Left great toe discoloration. Patient reports that that has been present for my whole life. For now we will monitor.  Patient denies any symptoms of pain.  Postop acute blood loss anemia. Hemoglobin at baseline around 13.  Hemoglobin dropped down to 11.  Remaining stable.  Monitor.   Subjective: No nausea no vomiting no fever no chills.  Pain  well-controlled.  No constipation.  Physical Exam: General: in Mild distress, No Rash Cardiovascular: S1 and S2 Present, No Murmur Respiratory: Good respiratory effort, Bilateral Air entry present. No Crackles, No wheezes Abdomen: Bowel Sound present, No tenderness Extremities: No edema Neuro: Alert and oriented x3, no new focal deficit  Data Reviewed: I have Reviewed nursing notes, Vitals, and Lab results. Since last encounter, pertinent lab results CBC and BMP   . I have ordered test including CBC and BMP and magnesium  .   Disposition: Status is: Inpatient Remains inpatient appropriate because: Monitor for stability of hemoglobin  creatinine  enoxaparin  (LOVENOX ) injection 40 mg Start: 10/08/23 1000 SCDs Start: 10/07/23 0126   Family Communication: No one at bedside Level of care: Med-Surg   Vitals:   10/09/23 0404 10/09/23 0555 10/09/23 0843 10/09/23 1527  BP: 121/73  127/63 132/84  Pulse: 94  (!) 108 75  Resp: 18  18 18   Temp: (!) 97.2 F (36.2 C) 97.9 F (36.6 C) 98.4 F (36.9 C) (!) 97.4 F (36.3 C)  TempSrc: Oral Oral Oral   SpO2: 95%  94% 94%  Weight:      Height:         Author: Charlean Congress, MD 10/09/2023 7:30 PM  Please look on [email or web address] to find out who is on call.

## 2023-10-09 NOTE — Care Management Important Message (Signed)
 Important Message  Patient Details  Name: [name] MRN: [id number] Date of Birth: 01-Mar-1932   Important Message Given:  Yes - Medicare IM     Wynonia Hedges 10/09/2023, 3:30 PM

## 2023-10-10 DIAGNOSIS — S72002A Fracture of unspecified part of neck of left femur, initial encounter for closed fracture: Secondary | ICD-10-CM | POA: Diagnosis not present

## 2023-10-10 DIAGNOSIS — W19XXXA Unspecified fall, initial encounter: Secondary | ICD-10-CM | POA: Diagnosis not present

## 2023-10-10 LAB — BASIC METABOLIC PANEL WITH GFR
Anion gap: 9 (ref 5–15)
BUN: 18 mg/dL (ref 8–23)
CO2: 26 mmol/L (ref 22–32)
Calcium: 8.1 mg/dL — ABNORMAL LOW (ref 8.9–10.3)
Chloride: 101 mmol/L (ref 98–111)
Creatinine, Ser: 1.13 mg/dL (ref 0.61–1.24)
GFR, Estimated: >60 mL/min (ref >60)
Glucose, Bld: 167 mg/dL — ABNORMAL HIGH (ref 70–99)
Potassium: 3.9 mmol/L (ref 3.5–5.1)
Sodium: 136 mmol/L (ref 135–145)

## 2023-10-10 LAB — GLUCOSE, CAPILLARY
Glucose-Capillary: 170 mg/dL — ABNORMAL HIGH (ref 70–99)
Glucose-Capillary: 207 mg/dL — ABNORMAL HIGH (ref 70–99)
Glucose-Capillary: 140 mg/dL — ABNORMAL HIGH (ref 70–99)
Glucose-Capillary: 189 mg/dL — ABNORMAL HIGH (ref 70–99)

## 2023-10-10 LAB — CBC
HCT: 35.9 % — ABNORMAL LOW (ref 39.0–52.0)
Hemoglobin: 11.7 g/dL — ABNORMAL LOW (ref 13.0–17.0)
MCH: 30.3 pg (ref 26.0–34.0)
MCHC: 32.6 g/dL (ref 30.0–36.0)
MCV: 93 fL (ref 80.0–100.0)
Platelets: 170 10*3/uL (ref 150–400)
RBC: 3.86 MIL/uL — ABNORMAL LOW (ref 4.22–5.81)
RDW: 13.2 % (ref 11.5–15.5)
WBC: 8.1 10*3/uL (ref 4.0–10.5)
nRBC: 0 % (ref 0.0–0.2)

## 2023-10-10 LAB — MAGNESIUM: Magnesium: 1.8 mg/dL (ref 1.7–2.4)

## 2023-10-10 NOTE — Progress Notes (Signed)
 Physical Therapy Treatment Patient Details Name: [name] MRN: [id number] DOB: 12-19-1931 Today's Date: 10/10/2023   History of Present Illness Pt is an 89 y.o. male presenting with after fall at home in garage and being found next morning. Found to have L intertrochanteric hip fx. Now s/p cephalo medullary nailing of L hip fx. PMH significant for arthritis, cardiac arrythmia, HLD, HTN, R side sciatica, DMII, bil knee surgeries, L shoulder surgery.    PT Comments  Pt seen for PT tx with pt agreeable. Pt pleasant but confused throughout session. Pt performs sit>stand from EOB, recliner with min assist with cuing re: hand placement, assistance to power up. Pt is able to progress to ambulating in room with RW & min assist but becomes visibly fatigued by end of gait. Pt with good use of BUE to offload LLE while advancing RLE. Pt received on room air, on 2L/min during gait. Difficulty obtaining SpO2 reading during session but after gait SpO2 85%, increased to >/= 90% & pt left on 2L/min. Will continue to follow pt acutely to progress mobility as able. Continue to recommend post acute rehab <3 hours therapy/day upon d/c.    If plan is discharge home, recommend the following: A lot of help with bathing/dressing/bathroom;A lot of help with walking and/or transfers;Assistance with cooking/housework;Assist for transportation;Help with stairs or ramp for entrance   Can travel by private vehicle     No  Equipment Recommendations  None recommended by PT (defer to next venue)    Recommendations for Other Services       Precautions / Restrictions Precautions Precautions: Fall Restrictions Weight Bearing Restrictions Per Provider Order: Yes LLE Weight Bearing Per Provider Order: Weight bearing as tolerated     Mobility  Bed Mobility Overal bed mobility: Needs Assistance Bed Mobility: Sit to Supine       Sit to supine: Min assist (assistance to elevate LLE onto bed)         Transfers Overall transfer level: Needs assistance Equipment used: Rolling walker (2 wheels) Transfers: Sit to/from Stand Sit to Stand: Min assist           General transfer comment: sit>stand from EOB, recliner, cuing re: hand placement, assistance to power up to standing    Ambulation/Gait Ambulation/Gait assistance: Min assist Gait Distance (Feet): 10 Feet Assistive device: Rolling walker (2 wheels) Gait Pattern/deviations: Decreased step length - left, Decreased step length - right, Decreased stride length, Decreased weight shift to left, Decreased stance time - left Gait velocity: decreased     General Gait Details: Pt uses BUE to offload LLE to advance RLE, decreased step length LLE, decreased foot clearance, ankle dorsiflexion & heel strike LLE.   Stairs             Wheelchair Mobility     Tilt Bed    Modified Rankin (Stroke Patients Only)       Balance Overall balance assessment: Needs assistance Sitting-balance support: Feet supported, Bilateral upper extremity supported Sitting balance-Leahy Scale: Fair Sitting balance - Comments: sitting EOB   Standing balance support: Bilateral upper extremity supported, Reliant on assistive device for balance, During functional activity Standing balance-Leahy Scale: Poor                              Communication Communication Communication: Impaired Factors Affecting Communication: Hearing impaired  Cognition Arousal: Alert Behavior During Therapy: WFL for tasks assessed/performed   PT - Cognitive impairments: Problem solving,  Safety/Judgement, Memory, Orientation, Awareness                       PT - Cognition Comments: Pt oriented to particpiating in therapy but also speaking [place], asking for someone to [place]. Does report he's in [facility] hospital. Following commands: Impaired Following commands impaired:  Follows multi-step commands with increased time    Cueing Cueing Techniques: Verbal cues  Exercises General Exercises - Lower Extremity Long Arc Quad: AROM, Seated, Strengthening, Left, 10 reps    General Comments        Pertinent Vitals/Pain Pain Assessment Pain Assessment: Faces Faces Pain Scale: Hurts a little bit Pain Location: LLE Pain Descriptors / Indicators: Grimacing, Discomfort Pain Intervention(s): Monitored during session, Repositioned    Home Living                          Prior Function            PT Goals (current goals can now be found in the care plan section) Acute Rehab PT Goals Patient Stated Goal: go home PT Goal Formulation: With patient Time For Goal Achievement: 10/21/23 Potential to Achieve Goals: Good Progress towards PT goals: Progressing toward goals    Frequency    Min 2X/week      PT Plan      Co-evaluation              AM-PAC PT 6 Clicks Mobility   Outcome Measure  Help needed turning from your back to your side while in a flat bed without using bedrails?: A Little Help needed moving from lying on your back to sitting on the side of a flat bed without using bedrails?: A Lot Help needed moving to and from a bed to a chair (including a wheelchair)?: A Little Help needed standing up from a chair using your arms (e.g., wheelchair or bedside chair)?: A Little Help needed to walk in hospital room?: A Lot Help needed climbing 3-5 steps with a railing? : Total 6 Click Score: 14    End of Session Equipment Utilized During Treatment: Gait belt;Oxygen  Activity Tolerance: Patient tolerated treatment well;Patient limited by fatigue Patient left: in bed;with call bell/phone within reach;with bed alarm set Nurse Communication: Mobility status (O2) PT Visit Diagnosis: Other abnormalities of gait and mobility (R26.89);Muscle weakness (generalized) (M62.81);Pain;Difficulty in walking, not elsewhere classified  (R26.2);Unsteadiness on feet (R26.81) Pain - Right/Left: Left Pain - part of body: Hip     Time: 4098-1191 PT Time Calculation (min) (ACUTE ONLY): 32 min  Charges:    $Therapeutic Activity: 23-37 mins PT General Charges $$ ACUTE PT VISIT: 1 Visit                     Emaline Handsome, PT, DPT 10/10/23, 1:51 PM    Venetta Gill 10/10/2023, 1:49 PM

## 2023-10-10 NOTE — Progress Notes (Signed)
 Occupational Therapy Treatment Patient Details Name: [name] MRN: [id number] DOB: 1931/11/09 Today's Date: 10/10/2023   History of present illness Pt is an 89 y.o. male presenting with after fall at home in garage and being found next morning. Found to have L intertrochanteric hip fx. Now s/p cephalo medullary nailing of L hip fx. PMH significant for arthritis, cardiac arrythmia, HLD, HTN, R side sciatica, DMII, bil knee surgeries, L shoulder surgery.   OT comments  Pt presented on the EOB and more agreeable to therapy today. Pt completed use of IS post set up and cued. He then completed sit to stand transfer with min assist from EOB with surface elevated and then transferred to chair to the R side with mod assist with step pivot. Pt needs increase in time but then agreeable to attempt another sit to stand transfer from chair with mod assist but was only able to stand for about 20 seconds. Pt requires set up for UE and total-max assist for LE ADLS. Patient will benefit from continued inpatient follow up therapy, <3 hours/day.      If plan is discharge home, recommend the following:  Two people to help with bathing/dressing/bathroom;A lot of help with bathing/dressing/bathroom;Two people to help with walking and/or transfers;Assistance with cooking/housework;Help with stairs or ramp for entrance;Assist for transportation   Equipment Recommendations  Other (comment) (TBD)    Recommendations for Other Services      Precautions / Restrictions Precautions Precautions: Fall Restrictions Weight Bearing Restrictions Per Provider Order: Yes LLE Weight Bearing Per Provider Order: Weight bearing as tolerated Other Position/Activity Restrictions: LLE       Mobility Bed Mobility Overal bed mobility: Needs Assistance             General bed mobility comments: pt presented at EOB    Transfers Overall transfer level: Needs assistance Equipment used: Rolling walker (2  wheels) Transfers: Sit to/from Stand Sit to Stand: Min assist, Mod assist           General transfer comment: min assist at EOB with surface elevated and mod assist from chair with pillow placed under hips     Balance Overall balance assessment: Needs assistance Sitting-balance support: Feet supported, Bilateral upper extremity supported Sitting balance-Leahy Scale: Fair Sitting balance - Comments: pt eating breakfast   Standing balance support: Bilateral upper extremity supported Standing balance-Leahy Scale: Poor Standing balance comment: reliant on support and external assist                           ADL either performed or assessed with clinical judgement   ADL Overall ADL's : Needs assistance/impaired Eating/Feeding: Independent;Sitting   Grooming: Set up;Sitting   Upper Body Bathing: Set up;Sitting   Lower Body Bathing: Maximal assistance;Total assistance;Sit to/from stand   Upper Body Dressing : Set up;Sitting   Lower Body Dressing: Maximal assistance;Total assistance;Sit to/from stand   Toilet Transfer: Moderate assistance;Cueing for safety;Cueing for sequencing;Rolling walker (2 wheels) Toilet Transfer Details (indicate cue type and reason): sted pivot Toileting- Clothing Manipulation and Hygiene: Total assistance;Sit to/from stand              Extremity/Trunk Assessment Upper Extremity Assessment Upper Extremity Assessment: Overall WFL for tasks assessed   Lower Extremity Assessment Lower Extremity Assessment: Defer to PT evaluation        Vision   Vision Assessment?: No apparent visual deficits Additional Comments: wears reading glasses at baseline   Perception Perception Perception: Not tested  Praxis Praxis Praxis: Not tested   Communication Communication Communication: No apparent difficulties Factors Affecting Communication: Hearing impaired   Cognition Arousal: Alert Behavior During Therapy: WFL for tasks  assessed/performed Cognition: No apparent impairments             OT - Cognition Comments: per nursing said he was confused but in session was aware where he was and what happened and recalled yesterday's therapy session                 Following commands: Impaired Following commands impaired: Follows multi-step commands with increased time      Cueing   Cueing Techniques: Verbal cues  Exercises      Shoulder Instructions       General Comments Pt required 3L with mobility and lowered back to 2L    Pertinent Vitals/ Pain       Pain Assessment Pain Assessment: Faces Faces Pain Scale: Hurts even more Breathing: normal Negative Vocalization: none Facial Expression: smiling or inexpressive Body Language: relaxed Consolability: no need to console PAINAD Score: 0 Pain Location: L hip and back Pain Descriptors / Indicators: Aching, Sore Pain Intervention(s): Limited activity within patient's tolerance, Monitored during session, Repositioned  Home Living                                          Prior Functioning/Environment              Frequency  Min 2X/week        Progress Toward Goals  OT Goals(current goals can now be found in the care plan section)  Progress towards OT goals: Progressing toward goals  Acute Rehab OT Goals Patient Stated Goal: to get stronger OT Goal Formulation: With patient Time For Goal Achievement: 10/21/23 Potential to Achieve Goals: Good ADL Goals Pt Will Perform Grooming: with contact guard assist;standing Pt Will Perform Lower Body Dressing: with contact guard assist;with adaptive equipment;sit to/from stand Pt Will Transfer to Toilet: with min assist;stand pivot transfer  Plan      Co-evaluation                 AM-PAC OT 6 Clicks Daily Activity     Outcome Measure   Help from another person eating meals?: None Help from another person taking care of personal grooming?: A Little Help  from another person toileting, which includes using toliet, bedpan, or urinal?: Total Help from another person bathing (including washing, rinsing, drying)?: A Lot Help from another person to put on and taking off regular upper body clothing?: A Little Help from another person to put on and taking off regular lower body clothing?: A Lot 6 Click Score: 15    End of Session Equipment Utilized During Treatment: Gait belt;Rolling walker (2 wheels)  OT Visit Diagnosis: Unsteadiness on feet (R26.81);Muscle weakness (generalized) (M62.81);History of falling (Z91.81);Pain Pain - Right/Left: Left Pain - part of body: Leg   Activity Tolerance Patient tolerated treatment well   Patient Left in chair;with call bell/phone within reach;with chair alarm set   Nurse Communication Mobility status        Time: 6962-9528 OT Time Calculation (min): 28 min  Charges: OT General Charges $OT Visit: 1 Visit OT Treatments $Self Care/Home Management : 23-37 mins  Erving Heather OTR/L  Acute Rehab Services  [phone] office number   Stevphen Elders 10/10/2023, 8:50 AM

## 2023-10-10 NOTE — TOC Progression Note (Addendum)
 Transition of Care [facility]) - Progression Note    Patient Details  Name: ANZEL KEARSE MRN: [id number] Date of Birth: 12/10/1931  [facility]) CM/SW Contact  Juliane Och, LCSW Phone Number: 10/10/2023, 10:06 AM  Clinical Narrative:     10:06 AM Patient's SNF insurance authorization was approved (ID [id number]) and is valid 10/10/2023-10/12/2023. CSW relayed informed to SNF who stated that they would have a bed available for patient discharge tomorrow. CSW informed medical team of expected discharge tomorrow to [facility].  Expected Discharge Plan: Skilled Nursing Facility Barriers to Discharge: Other (must enter comment) (SNF pending bed availability)  Expected Discharge Plan and Services In-house Referral: Clinical Social Work   Post Acute Care Choice: Skilled Nursing Facility Living arrangements for the past 2 months: Single Family Home                                       Social Determinants of Health (SDOH) Interventions SDOH Screenings   Food Insecurity: No Food Insecurity (10/08/2023)  Housing: Low Risk  (10/08/2023)  Transportation Needs: No Transportation Needs (10/08/2023)  Utilities: Not At Risk (10/08/2023)  Social Connections: Moderately Integrated (10/08/2023)  Tobacco Use: Medium Risk (10/06/2023)    Readmission Risk Interventions     No data to display

## 2023-10-10 NOTE — Progress Notes (Signed)
 PROGRESS NOTE    [name]  RJJ:[id number] DOB: [birth year] DOA: 10/06/2023 PCP: [name] Robins, MD    Brief Narrative:   [name] is a 89 y.o. male with past medical history significant for HTN, HLD, DM2, CKD stage IIIa, BPH, osteoarthritis, OSA on nocturnal CPAP who presented to [facility] ED on 10/06/2023 via EMS after patient was found in his garage lying on his right side complaining of right-sided hip pain.  Apparently patient was in his garage getting something out of the refrigerator around 1045 in which he was proceeding to go back to the house in which she lost his balance falling on the second stair.  Unable to get up; and was found the following morning.  On arrival to the ED, patient was noted to have a left intertrochanteric fracture.  Orthopedics was consulted and TRH consulted for admission for further evaluation and management.  Assessment & Plan:   Left intertrochanteric hip fracture Left adductor muscular compartment hematoma Mechanical fall Patient presenting to ED after being found on the garage floor after sustaining a mechanical fall.  Imaging notable for left hip acute displaced and comminuted intertrochanteric fracture, left adductor muscular compartment hematoma likely from fracture site.  Orthopedics was consulted and patient underwent cephalomedullary nailing of the left hip fracture by Dr. Hulda Mage on 10/06/2023. -- WBAT LLE -- Tylenol  5 mg p.o. every 6 hours -- Norco 5-3 5 mg p.o. every 4 hours as needed moderate pain  -- Norco 7.5-325 mg every 4 hours as needed severe pain -- Lovenox  for postoperative DVT prophylaxis, transition to aspirin  325 mg p.o. daily on discharge for total 30 days per orthopedics -- PT/OT  recommend SNF placement, TOC following -- Outpatient follow-up with orthopedics 2 weeks for repeat x-rays, wound check  Acute renal failure on CKD stage IIIa Baseline creatinine 1.0.  On admission creatinine elevated 1.4, with CK level 5800.  Etiology  likely secondary to prerenal azotemia also with mild nontraumatic rhabdomyolysis.  Improved with IV fluid hydration. -- Cr 1.43>1.26>1.24>1.37>1.16>1.13  Enterococcus faecalis UTI Received 1 dose of fosfomycin 3 g on 10/08/2023.  Postoperative blood loss anemia Hemoglobin baseline 13.0.  Dropped to 11.0 postoperatively, now stable. -- Hgb 13.8>>11.3>11.2>11.7  Leukocytosis: Resolved WBC count 16.4 on admission, likely stress-induced versus hemoconcentration in the setting of dehydration. -- WBC 16.4>>11.0>8.5>8.1  HTN Currently not on antihypertensives outpatient.  BP 126/60, stable.  HLD On simvastatin  20 mg p.o. nightly at home.  DM2, open controlled with hyperglycemia Hemoglobin A1c 8.2 on 10/07/2023.  Home medications include metformin  500 mg 3 times daily -- Hold metformin  while inpatient -- Moderate SSI for coverage -- CBGs before every meal/at bedtime  OSA Continue nocturnal CPAP  DVT prophylaxis: enoxaparin  (LOVENOX ) injection 40 mg Start: 10/08/23 1000 SCDs Start: 10/07/23 0126    Code Status: Full Code Family Communication: No family present bedside this morning  Disposition Plan:  Level of care: Med-Surg Status is: Inpatient Remains inpatient appropriate because: Pending SNF placement    Consultants:  Orthopedics, Dr. Hulda Mage  Procedures:  Cephalomedullary nail, left hip; Dr. Hulda Mage 10/06/2023  Antimicrobials:  Perioperative cefazolin  6/14 - 6/15 Fosfomycin 6/16   Subjective: Patient seen examined bedside, sitting at bedside chair.  No complaints this morning.  Pain well-controlled.  Awaiting SNF placement.  Denies headache, no dizziness, no chest pain, no palpitations, no shortness of breath, no abdominal pain, no fever/chills/night sweats, no nausea/vomiting/diarrhea, no focal weakness, no fatigue, no paresthesia.  No acute events overnight per nursing staff.  Objective: Vitals:  10/09/23 1527 10/09/23 2126 10/10/23 0429 10/10/23 0900  BP: 132/84 (!)  154/82 126/60 124/68  Pulse: 75 97 (!) 109 (!) 106  Resp: 18 19 20 18   Temp: (!) 97.4 F (36.3 C) 98.1 F (36.7 C) (!) 97.4 F (36.3 C) 98.1 F (36.7 C)  TempSrc:   Oral   SpO2: 94% 100% 96% 98%  Weight:      Height:        Intake/Output Summary (Last 24 hours) at 10/10/2023 1111 Last data filed at 10/10/2023 0547 Gross per 24 hour  Intake 360 ml  Output 1950 ml  Net -1590 ml   Filed Weights   10/06/23 1502 10/08/23 0947  Weight: 92.1 kg 92.2 kg    Examination:  Physical Exam: GEN: NAD, alert and oriented x 3, wd/wn HEENT: NCAT, PERRL, EOMI, sclera clear, MMM PULM: CTAB w/o wheezes/crackles, normal respiratory effort, on 2 L nasal cannula with SpO2 98% at rest CV: RRR w/o M/G/R GI: abd soft, NTND, + BS MSK: Trace bilateral lower extremity peripheral edema, moves all extremities independently with preserved muscle strength, left cervical incision site with dressing in place, clean/dry/intact NEURO: CN II-XII intact, no focal deficits, sensation to light touch intact PSYCH: normal mood/affect    Data Reviewed: I have personally reviewed following labs and imaging studies  CBC: Recent Labs  Lab 10/06/23 1525 10/07/23 0607 10/08/23 0518 10/09/23 0550 10/10/23 0521  WBC 16.4* 14.4* 11.0* 8.5 8.1  NEUTROABS 14.3*  --   --   --   --   HGB 13.8 12.2* 11.3* 11.2* 11.7*  HCT 41.8 37.2* 34.7* 34.7* 35.9*  MCV 92.9 93.0 93.8 93.5 93.0  PLT 196 172 155 150 170   Basic Metabolic Panel: Recent Labs  Lab 10/06/23 1525 10/07/23 0607 10/07/23 1123 10/08/23 0518 10/09/23 0550 10/10/23 0521  NA 138  --  135 138 137 136  K 4.5  --  4.6 4.1 4.1 3.9  CL 104  --  105 104 106 101  CO2 22  --  22 24 23 26   GLUCOSE 251*  --  235* 160* 162* 167*  BUN 28*  --  26* 28* 26* 18  CREATININE 1.43* 1.26* 1.24 1.37* 1.16 1.13  CALCIUM 8.3*  --  7.5* 7.9* 7.7* 8.1*  MG  --   --  1.7 1.8 1.8 1.8   GFR: Estimated Creatinine Clearance: 51.5 mL/min (by C-G formula based on SCr of  1.13 mg/dL). Liver Function Tests: Recent Labs  Lab 10/06/23 1525  AST 126*  ALT 44  ALKPHOS 88  BILITOT 1.4*  PROT 6.0*  ALBUMIN 3.4*   Recent Labs  Lab 10/06/23 1525  LIPASE 22   No results for input(s): AMMONIA in the last 168 hours. Coagulation Profile: Recent Labs  Lab 10/06/23 1525  INR 1.1   Cardiac Enzymes: Recent Labs  Lab 10/06/23 1525 10/08/23 0517 10/09/23 0550  CKTOTAL 5,820* 1,099* 510*   BNP (last 3 results) No results for input(s): PROBNP in the last 8760 hours. HbA1C: No results for input(s): HGBA1C in the last 72 hours. CBG: Recent Labs  Lab 10/09/23 0727 10/09/23 1135 10/09/23 1633 10/09/23 2126 10/10/23 0738  GLUCAP 167* 207* 138* 157* 170*   Lipid Profile: No results for input(s): CHOL, HDL, LDLCALC, TRIG, CHOLHDL, LDLDIRECT in the last 72 hours. Thyroid  Function Tests: No results for input(s): TSH, T4TOTAL, FREET4, T3FREE, THYROIDAB in the last 72 hours. Anemia Panel: No results for input(s): VITAMINB12, FOLATE, FERRITIN, TIBC, IRON, RETICCTPCT in the last  72 hours. Sepsis Labs: No results for input(s): PROCALCITON, LATICACIDVEN in the last 168 hours.  Recent Results (from the past 240 hours)  Urine Culture     Status: Abnormal   Collection Time: 10/06/23  7:05 PM   Specimen: Urine, Random  Result Value Ref Range Status   Specimen Description URINE, RANDOM  Final   Special Requests   Final    URINE, CLEAN CATCH Performed at [facility], [address] [place], [place] [address]    Culture 80,000 COLONIES/mL ENTEROCOCCUS FAECALIS (A)  Final   Report Status 10/08/2023 FINAL  Final   Organism ID, Bacteria ENTEROCOCCUS FAECALIS (A)  Final      Susceptibility   Enterococcus faecalis - MIC*    AMPICILLIN <=2 SENSITIVE Sensitive     NITROFURANTOIN <=16 SENSITIVE Sensitive     VANCOMYCIN 1 SENSITIVE Sensitive     * 80,000 COLONIES/mL ENTEROCOCCUS FAECALIS  Surgical pcr screen      Status: Abnormal   Collection Time: 10/07/23 12:44 AM   Specimen: Nasal Mucosa; Nasal Swab  Result Value Ref Range Status   MRSA, PCR POSITIVE (A) NEGATIVE Final    Comment: RESULT CALLED TO, READ BACK BY AND VERIFIED WITH: KARANGWA RN 10/07/2023 @ 0312    Staphylococcus aureus POSITIVE (A) NEGATIVE Final    Comment: (NOTE) The Xpert SA Assay (FDA approved for NASAL specimens in patients 22 years of age and older), is one component of a comprehensive surveillance program. It is not intended to diagnose infection nor to guide or monitor treatment. Performed at [facility], [address], [place], [place] [address]          Radiology Studies: DG Foot 2 Views Right Result Date: 10/08/2023 CLINICAL DATA:  Fall.  Pain. EXAM: RIGHT FOOT - 2 VIEW COMPARISON:  None Available. FINDINGS: No acute fracture, dislocation or subluxation. First metatarsal phalangeal degenerative changes with osteophytes and joint space narrowing. Posterior and plantar calcaneal spurs. IMPRESSION: Degenerative changes. No acute osseous abnormalities. Electronically Signed   By: Sydell Eva M.D.   On: 10/08/2023 19:03        Scheduled Meds:  acetaminophen   500 mg Oral Q6H   chlorhexidine   15 mL Mouth/Throat BID   Chlorhexidine  Gluconate Cloth  6 each Topical Daily   docusate sodium   100 mg Oral Daily   enoxaparin  (LOVENOX ) injection  40 mg Subcutaneous Daily   insulin  aspart  0-15 Units Subcutaneous TID WC   insulin  aspart  0-5 Units Subcutaneous QHS   mirabegron ER  50 mg Oral QHS   mupirocin  ointment  1 Application Nasal BID   Continuous Infusions:   LOS: 4 days    Time spent: 45 minutes spent on 10/10/2023 caring for this patient face-to-face including chart review, ordering labs/tests, documenting, discussion with nursing staff, consultants, updating family and interview/physical exam    Rema Care [place], DO [facility] Available via Epic secure chat 7am-7pm After these  hours, please refer to coverage provider listed on [email or web address] 10/10/2023, 11:11 AM

## 2023-10-11 ENCOUNTER — Encounter (HOSPITAL_COMMUNITY): Payer: Self-pay | Admitting: Internal Medicine

## 2023-10-11 DIAGNOSIS — W19XXXA Unspecified fall, initial encounter: Secondary | ICD-10-CM | POA: Diagnosis not present

## 2023-10-11 DIAGNOSIS — S72002A Fracture of unspecified part of neck of left femur, initial encounter for closed fracture: Secondary | ICD-10-CM | POA: Diagnosis not present

## 2023-10-11 LAB — GLUCOSE, CAPILLARY
Glucose-Capillary: 164 mg/dL — ABNORMAL HIGH (ref 70–99)
Glucose-Capillary: 236 mg/dL — ABNORMAL HIGH (ref 70–99)

## 2023-10-11 MED ORDER — DOCUSATE SODIUM 100 MG PO CAPS: 100.0000 mg | ORAL_CAPSULE | Freq: Two times a day (BID) | ORAL | Status: AC

## 2023-10-11 NOTE — TOC Transition Note (Signed)
 Transition of Care [facility]) - Discharge Note   Patient Details  Name: [name] MRN: [id number] Date of Birth: 27-Jan-1932  Transition of Care [facility]) CM/SW Contact:  Arron Big, LCSWA Phone Number: 10/11/2023, 10:40 AM   Clinical Narrative:   Patient will DC to: [facility] and Rehab Anticipated DC date: 10/11/23  Family notified: Mollie Anger (dtr) Transport by: Lyna Sandhoff   Per MD patient ready for DC to [facility]. RN to call report prior to discharge [phone]; room 407). RN, patient, patient's family, and facility notified of DC. Discharge Summary and FL2 sent to facility. DC packet on chart. Ambulance transport requested for patient at 10:42 AM.   CSW will sign off for now as social work intervention is no longer needed. Please consult us  again if new needs arise.      Final next level of care: Skilled Nursing Facility Barriers to Discharge: Other (must enter comment) (SNF pending bed availability)   Patient Goals and CMS Choice            Discharge Placement              Patient chooses bed at: [facility] Patient to be transferred to facility by: [facility] Name of family member notified: Dewitte Foreman (Daughter) [phone] Patient and family notified of of transfer: 10/11/23  Discharge Plan and Services Additional resources added to the After Visit Summary for   In-house Referral: Clinical Social Work   Post Acute Care Choice: Skilled Nursing Facility                               Social Drivers of Health (SDOH) Interventions SDOH Screenings   Food Insecurity: No Food Insecurity (10/08/2023)  Housing: Low Risk  (10/08/2023)  Transportation Needs: No Transportation Needs (10/08/2023)  Utilities: Not At Risk (10/08/2023)  Social Connections: Moderately Integrated (10/08/2023)  Tobacco Use: Medium Risk (10/11/2023)     Readmission Risk Interventions     No data to display

## 2023-10-11 NOTE — Discharge Summary (Signed)
 Physician Discharge Summary  [name] ZOX:[id number] DOB: [birth year] DOA: 10/06/2023  PCP: Avva, Ravisankar, MD  Admit date: 10/06/2023 Discharge date: 10/11/2023  Admitted From: Home Disposition: [facility] SNF  Recommendations for Outpatient Follow-up:  Follow up with PCP in 1-2 weeks Follow-up with orthopedics, Dr. Alana Hoyle day or 2 weeks Continue aspirin  325 mg p.o. daily x 30 days for postoperative DVT prophylaxis per orthopedics   Discharge Condition: Stable CODE STATUS: Full code Diet recommendation: Heart healthy/consistent carbohydrate diet  History of present illness:  [name] is a 89 y.o. male with past medical history significant for HTN, HLD, DM2, CKD stage IIIa, BPH, osteoarthritis, OSA on nocturnal CPAP who presented to [facility] ED on 10/06/2023 via EMS after patient was found in his garage lying on his right side complaining of right-sided hip pain.  Apparently patient was in his garage getting something out of the refrigerator around 1045 in which he was proceeding to go back to the house in which she lost his balance falling on the second stair.  Unable to get up; and was found the following morning.  On arrival to the ED, patient was noted to have a left intertrochanteric fracture.  Orthopedics was consulted and [facility] consulted for admission for further evaluation and management.   Hospital course:  Left intertrochanteric hip fracture Left adductor muscular compartment hematoma Mechanical fall Patient presenting to ED after being found on the garage floor after sustaining a mechanical fall.  Imaging notable for left hip acute displaced and comminuted intertrochanteric fracture, left adductor muscular compartment hematoma likely from fracture site.  Orthopedics was consulted and patient underwent cephalomedullary nailing of the left hip fracture by Dr. Hulda Mage on 10/06/2023.  Weightbearing as tolerated to left lower extremity.  Continue Tylenol /Norco as needed for pain control.   Aspirin  325 mg p.o. daily on discharge for total 30 days per orthopedics for postoperative DVT prophylaxis.  Discharging to SNF for further rehabilitation.  Outpatient follow-up with orthopedics 2 weeks for repeat x-rays, wound check.   Acute renal failure on CKD stage IIIa Nontraumatic rhabdomyolysis Baseline creatinine 1.0.  On admission creatinine elevated 1.4, with CK level 5800.  Etiology likely secondary to prerenal azotemia also with mild nontraumatic rhabdomyolysis.  Improved with IV fluid hydration.  Creatinine improved to 1.13 at time of discharge.   Enterococcus faecalis UTI Received 1 dose of fosfomycin 3 g on 10/08/2023.   Postoperative blood loss anemia Hemoglobin baseline 13.0.  Dropped to 11.0 postoperatively, now stable.  Hemoglobin 11.7 at time of discharge.   Leukocytosis: Resolved WBC count 16.4 on admission, likely stress-induced versus hemoconcentration in the setting of dehydration.  WC count improved 8.1 at time of discharge.   HTN Currently not on antihypertensives outpatient.  BP 126/60, stable.   HLD On simvastatin  20 mg p.o. nightly at home.   DM2, uncontrolled with hyperglycemia Hemoglobin A1c 8.2 on 10/07/2023.  Home medications include metformin  500 mg 3 times daily   OSA Continue nocturnal CPAP  Discharge Diagnoses:  Principal Problem:   Fall Active Problems:   Diabetes mellitus (HCC)   Hypertension   Hypercholesteremia   OSA (obstructive sleep apnea)   BPH with urinary obstruction   Closed 2-part intertrochanteric fracture of left femur (HCC)   Rhabdomyolysis   AKI (acute kidney injury) [facility])    Discharge Instructions  Discharge Instructions     Call MD for:  difficulty breathing, headache or visual disturbances   Complete by: As directed    Call MD for:  extreme  fatigue   Complete by: As directed    Call MD for:  persistant dizziness or light-headedness   Complete by: As directed    Call MD for:  persistant nausea and vomiting    Complete by: As directed    Call MD for:  severe uncontrolled pain   Complete by: As directed    Call MD for:  temperature >100.4   Complete by: As directed    Diet - low sodium heart healthy   Complete by: As directed    Increase activity slowly   Complete by: As directed    Leave dressing on - Keep it clean, dry, and intact until clinic visit   Complete by: As directed       Allergies as of 10/11/2023   No Known Allergies      Medication List     STOP taking these medications    escitalopram 10 MG tablet Commonly known as: LEXAPRO       TAKE these medications    acetaminophen  500 MG tablet Commonly known as: TYLENOL  Take 500 mg by mouth 2 (two) times daily as needed for moderate pain (pain score 4-6), fever or headache.   aspirin  325 MG tablet Commonly known as: Bayer Aspirin  Take 1 tablet by mouth for 30 DAYS for blood clot prevention   docusate sodium  100 MG capsule Commonly known as: COLACE Take 1 capsule (100 mg total) by mouth 2 (two) times daily for 14 days.   HYDROcodone -acetaminophen  5-325 MG tablet Commonly known as: NORCO/VICODIN Take 1 tablet by mouth every 6 (six) hours as needed (for post op pain).   metFORMIN  500 MG tablet Commonly known as: GLUCOPHAGE  Take 500 mg by mouth with breakfast, with lunch, and with evening meal.   Myrbetriq 50 MG Tb24 tablet Generic drug: mirabegron ER Take 50 mg by mouth at bedtime.   simvastatin  20 MG tablet Commonly known as: ZOCOR  Take 20 mg by mouth at bedtime.               Discharge Care Instructions  (From admission, onward)           Start     Ordered   10/11/23 0000  Leave dressing on - Keep it clean, dry, and intact until clinic visit        10/11/23 0935            Follow-up Information     Donnamarie Gables, MD Follow up in 2 week(s).   Specialty: Orthopedic Surgery Contact information: [address] [place] [address] [phone]         Avva, Ravisankar,  MD. Schedule an appointment as soon as possible for a visit in 1 week(s).   Specialty: Internal Medicine Contact information: [address] [place] [address] [phone]                No Known Allergies  Consultations: Orthopedics, Dr. Hulda Mage   Procedures/Studies: DG Foot 2 Views Right Result Date: 10/08/2023 CLINICAL DATA:  Fall.  Pain. EXAM: RIGHT FOOT - 2 VIEW COMPARISON:  None Available. FINDINGS: No acute fracture, dislocation or subluxation. First metatarsal phalangeal degenerative changes with osteophytes and joint space narrowing. Posterior and plantar calcaneal spurs. IMPRESSION: Degenerative changes. No acute osseous abnormalities. Electronically Signed   By: Sydell Eva M.D.   On: 10/08/2023 19:03   DG C-Arm 1-60 Min Result Date: 10/06/2023 CLINICAL DATA:  [id number] Surgery, elective [id number]; IM nail EXAM: LEFT FEMUR 2 VIEWS; DG C-ARM 1-60 MIN COMPARISON:  X-ray left knee 10/06/2023, x-ray left hip 10/06/2023 FINDINGS: Intraoperative intramedullary nail fixation of the left femur and hip in the setting of left intertrochanteric fracture. 7 low resolution intraoperative spot views of the left femur were obtained. No new fracture visible on the limited views.Partially visualized left knee arthroplasty. Total fluoroscopy time: 74.4 seconds Total radiation dose: 1589 mGy IMPRESSION: Intraoperative intramedullary nail fixation of the left femur and hip. Electronically Signed   By: Morgane  Naveau M.D.   On: 10/06/2023 23:01   DG FEMUR MIN 2 VIEWS LEFT Result Date: 10/06/2023 CLINICAL DATA:  [id number] Surgery, elective [id number]; IM nail EXAM: LEFT FEMUR 2 VIEWS; DG C-ARM 1-60 MIN COMPARISON:  X-ray left knee 10/06/2023, x-ray left hip 10/06/2023 FINDINGS: Intraoperative intramedullary nail fixation of the left femur and hip in the setting of left intertrochanteric fracture. 7 low resolution intraoperative spot views of the left femur were obtained. No new fracture visible on the  limited views.Partially visualized left knee arthroplasty. Total fluoroscopy time: 74.4 seconds Total radiation dose: 1589 mGy IMPRESSION: Intraoperative intramedullary nail fixation of the left femur and hip. Electronically Signed   By: Morgane  Naveau M.D.   On: 10/06/2023 23:01   CT Head Wo Contrast Result Date: 10/06/2023 CLINICAL DATA:  Neck trauma (Age >= 65y); Head trauma, minor (Age >= 65y) EXAM: CT HEAD WITHOUT CONTRAST CT CERVICAL SPINE WITHOUT CONTRAST TECHNIQUE: Multidetector CT imaging of the head and cervical spine was performed following the standard protocol without intravenous contrast. Multiplanar CT image reconstructions of the cervical spine were also generated. RADIATION DOSE REDUCTION: This exam was performed according to the departmental dose-optimization program which includes automated exposure control, adjustment of the mA and/or kV according to patient size and/or use of iterative reconstruction technique. COMPARISON:  None Available. FINDINGS: CT HEAD FINDINGS Brain: Patchy and confluent areas of decreased attenuation are noted throughout the deep and periventricular white matter of the cerebral hemispheres bilaterally, compatible with chronic microvascular ischemic disease. No evidence of large-territorial acute infarction. No parenchymal hemorrhage. No mass lesion. No extra-axial collection. No mass effect or midline shift. No hydrocephalus. Basilar cisterns are patent. Vascular: No hyperdense vessel. Atherosclerotic calcifications are present within the cavernous internal carotid and vertebral arteries. Skull: No acute fracture or focal lesion. Sinuses/Orbits: Paranasal sinuses and mastoid air cells are clear. Bilateral lens replacement. Otherwise the orbits are unremarkable. Other: None. CT CERVICAL SPINE FINDINGS Alignment: Normal. Skull base and vertebrae: Multilevel moderate degenerative change of the spine. Associated multilevel severe osseous neural foraminal stenosis. No  severe osseous central canal stenosis. No acute fracture. No aggressive appearing focal osseous lesion or focal pathologic process. Soft tissues and spinal canal: No prevertebral fluid or swelling. No visible canal hematoma. Upper chest: Unremarkable. Other: None. IMPRESSION: 1. No acute intracranial abnormality. 2. No acute displaced fracture or traumatic listhesis of the cervical spine. Electronically Signed   By: Morgane  Naveau M.D.   On: 10/06/2023 18:32   CT Cervical Spine Wo Contrast Result Date: 10/06/2023 CLINICAL DATA:  Neck trauma (Age >= 65y); Head trauma, minor (Age >= 65y) EXAM: CT HEAD WITHOUT CONTRAST CT CERVICAL SPINE WITHOUT CONTRAST TECHNIQUE: Multidetector CT imaging of the head and cervical spine was performed following the standard protocol without intravenous contrast. Multiplanar CT image reconstructions of the cervical spine were also generated. RADIATION DOSE REDUCTION: This exam was performed according to the departmental dose-optimization program which includes automated exposure control, adjustment of the mA and/or kV according to patient size and/or use of iterative reconstruction technique. COMPARISON:  None  Available. FINDINGS: CT HEAD FINDINGS Brain: Patchy and confluent areas of decreased attenuation are noted throughout the deep and periventricular white matter of the cerebral hemispheres bilaterally, compatible with chronic microvascular ischemic disease. No evidence of large-territorial acute infarction. No parenchymal hemorrhage. No mass lesion. No extra-axial collection. No mass effect or midline shift. No hydrocephalus. Basilar cisterns are patent. Vascular: No hyperdense vessel. Atherosclerotic calcifications are present within the cavernous internal carotid and vertebral arteries. Skull: No acute fracture or focal lesion. Sinuses/Orbits: Paranasal sinuses and mastoid air cells are clear. Bilateral lens replacement. Otherwise the orbits are unremarkable. Other: None. CT  CERVICAL SPINE FINDINGS Alignment: Normal. Skull base and vertebrae: Multilevel moderate degenerative change of the spine. Associated multilevel severe osseous neural foraminal stenosis. No severe osseous central canal stenosis. No acute fracture. No aggressive appearing focal osseous lesion or focal pathologic process. Soft tissues and spinal canal: No prevertebral fluid or swelling. No visible canal hematoma. Upper chest: Unremarkable. Other: None. IMPRESSION: 1. No acute intracranial abnormality. 2. No acute displaced fracture or traumatic listhesis of the cervical spine. Electronically Signed   By: Morgane  Naveau M.D.   On: 10/06/2023 18:32   CT CHEST ABDOMEN PELVIS W CONTRAST Result Date: 10/06/2023 CLINICAL DATA:  Polytrauma, blunt.  Fall EXAM: CT CHEST, ABDOMEN, AND PELVIS WITH CONTRAST TECHNIQUE: Multidetector CT imaging of the chest, abdomen and pelvis was performed following the standard protocol during bolus administration of intravenous contrast. RADIATION DOSE REDUCTION: This exam was performed according to the departmental dose-optimization program which includes automated exposure control, adjustment of the mA and/or kV according to patient size and/or use of iterative reconstruction technique. CONTRAST:  75mL OMNIPAQUE IOHEXOL 350 MG/ML SOLN COMPARISON:  Chest x-ray 12/26/2015 FINDINGS: CHEST: Cardiovascular: No aortic injury. The thoracic aorta is normal in caliber. The heart is normal in size. No significant pericardial effusion. Severe atherosclerotic plaque. At least 3 vessel coronary calcification. Mediastinum/Nodes: No pneumomediastinum. No mediastinal hematoma. The esophagus is unremarkable.  Small hiatal hernia. The thyroid  is unremarkable. The central airways are patent. No mediastinal, hilar, or axillary lymphadenopathy. Lungs/Pleura: Severe emphysematous changes more prominent along the upper lung zones. No focal consolidation. No pulmonary nodule. No pulmonary mass. No pulmonary  contusion or laceration. No pneumatocele formation. No pleural effusion. No pneumothorax. No hemothorax. Musculoskeletal/Chest wall: No chest wall mass. Prior screw fixation of the left humeral head with fracture of 1 of the screws (4:14). No acute rib or sternal fracture. No spinal fracture. ABDOMEN / PELVIS: Hepatobiliary: Not enlarged. No focal lesion. No laceration or subcapsular hematoma. The gallbladder is otherwise unremarkable with no radio-opaque gallstones. No biliary ductal dilatation. Pancreas: Normal pancreatic contour. No main pancreatic duct dilatation. Spleen: Not enlarged. No focal lesion. No laceration, subcapsular hematoma, or vascular injury. Adrenals/Urinary Tract: No nodularity bilaterally. Bilateral kidneys enhance symmetrically. No hydronephrosis. No contusion, laceration, or subcapsular hematoma. Fluid is lesions of the kidneys likely represent simple renal cysts. Simple renal cysts, in the absence of clinically indicated signs/symptoms, require no independent follow-up. No injury to the vascular structures or collecting systems. No hydroureter. The urinary bladder is unremarkable. On delayed imaging, there is no urothelial wall thickening and there are no filling defects in the opacified portions of the bilateral collecting systems or ureters. Stomach/Bowel: No small or large bowel wall thickening or dilatation. The appendix is unremarkable. Vasculature/Lymphatics: Severe atherosclerotic plaque. No abdominal aorta or iliac aneurysm. No active contrast extravasation or pseudoaneurysm. No abdominal, pelvic, inguinal lymphadenopathy. Reproductive: Prostate is unremarkable. Other: No simple free fluid ascites. No pneumoperitoneum.  No hemoperitoneum. No mesenteric hematoma identified. No organized fluid collection. Musculoskeletal: No significant soft tissue hematoma. Enlarged and heterogeneous asymmetric left adductor musculature compartment consistent with hematoma with underlying active  extravasation not excluded given with the hyperdensity extending from the fracture site and adjacent vasculature. Left hip acute displaced and comminuted intertrochanteric fracture. L3-L5 posterolateral surgical hardware fusion. Chronic L4 fracture. No spinal fracture. Other ports and devices: None. IMPRESSION: 1. Left hip acute displaced and comminuted intertrochanteric fracture. 2. Associated left adductor musculature compartment consistent with hematoma with underlying active extravasation not excluded given with the hyperdensity extending from the fracture site and adjacent vasculature. 3. No acute intrathoracic, intra-abdominal, intrapelvic traumatic injury. 4. No acute fracture or traumatic malalignment of the thoracic or lumbar spine. 5. Aortic Atherosclerosis (ICD10-I70.0) and Emphysema (ICD10-J43.9). 6. Prior screw fixation of the left humeral head with fracture of 1 of the screws. Electronically Signed   By: Morgane  Naveau M.D.   On: 10/06/2023 18:28   DG Wrist Complete Right Result Date: 10/06/2023 CLINICAL DATA:  Fall EXAM: RIGHT WRIST - COMPLETE 3+ VIEW COMPARISON:  None Available. FINDINGS: There is no evidence of fracture or dislocation. First and second digit carpometacarpal joint degenerative changes. There is no evidence of arthropathy or other focal bone abnormality. Soft tissues are unremarkable. Vascular calcification IMPRESSION: No acute displaced fracture or dislocation. Electronically Signed   By: Morgane  Naveau M.D.   On: 10/06/2023 18:10   DG Knee 1-2 Views Right Result Date: 10/06/2023 CLINICAL DATA:  144615 Pain 144615 EXAM: RIGHT KNEE - 1-2 VIEW COMPARISON:  None Available. FINDINGS: Total knee arthroplasty. No evidence of fracture, dislocation, or joint effusion. No evidence of arthropathy or other focal bone abnormality. Soft tissues are unremarkable. Atherosclerotic plaque. IMPRESSION: 1. Total knee arthroplasty. 2.  Negative for acute traumatic injury. Electronically Signed    By: Morgane  Naveau M.D.   On: 10/06/2023 18:09   DG Knee 1-2 Views Left Result Date: 10/06/2023 CLINICAL DATA:  144615 Pain 144615 EXAM: LEFT KNEE - 1-2 VIEW COMPARISON:  None Available. FINDINGS: Total knee arthroplasty. No evidence of fracture, dislocation, or joint effusion. No evidence of arthropathy or other focal bone abnormality. Soft tissues are unremarkable. IMPRESSION: 1. Total knee arthroplasty. 2.  Negative for acute traumatic injury. Electronically Signed   By: Morgane  Naveau M.D.   On: 10/06/2023 18:09   DG Shoulder Right Result Date: 10/06/2023 CLINICAL DATA:  fall EXAM: RIGHT SHOULDER - 2+ VIEW COMPARISON:  None Available. FINDINGS: There is no evidence of fracture or dislocation. Acromioclavicular joint degenerative changes. Subcutaneus soft tissue edema of the shoulder. IMPRESSION: No acute displaced fracture or dislocation. Electronically Signed   By: Morgane  Naveau M.D.   On: 10/06/2023 18:03   DG Hip Unilat W or Wo Pelvis 2-3 Views Left Result Date: 10/06/2023 CLINICAL DATA:  fall EXAM: DG HIP (WITH OR WITHOUT PELVIS) 2-3V LEFT; DG HIP (WITH OR WITHOUT PELVIS) 2-3V RIGHT COMPARISON:  None Available. FINDINGS: Acute displaced and comminuted left hip intertrochanteric fracture. No left hip dislocation. No acute displaced fracture or dislocation of the right hip. No acute displaced fracture or diastasis of the bones of the pelvis. Visualized lower lumbar spine demonstrates surgical hardware. Mild to moderate right hip degenerative changes. There is no evidence of severe arthropathy or other focal bone abnormality. IMPRESSION: Acute displaced and comminuted left hip intertrochanteric fracture. Electronically Signed   By: Morgane  Naveau M.D.   On: 10/06/2023 18:02   DG Hip Unilat W or Wo Pelvis 2-3 Views Right  Result Date: 10/06/2023 CLINICAL DATA:  fall EXAM: DG HIP (WITH OR WITHOUT PELVIS) 2-3V LEFT; DG HIP (WITH OR WITHOUT PELVIS) 2-3V RIGHT COMPARISON:  None Available. FINDINGS:  Acute displaced and comminuted left hip intertrochanteric fracture. No left hip dislocation. No acute displaced fracture or dislocation of the right hip. No acute displaced fracture or diastasis of the bones of the pelvis. Visualized lower lumbar spine demonstrates surgical hardware. Mild to moderate right hip degenerative changes. There is no evidence of severe arthropathy or other focal bone abnormality. IMPRESSION: Acute displaced and comminuted left hip intertrochanteric fracture. Electronically Signed   By: Morgane  Naveau M.D.   On: 10/06/2023 18:02   DG Chest Portable 1 View Result Date: 10/06/2023 CLINICAL DATA:  fall EXAM: PORTABLE CHEST 1 VIEW COMPARISON:  Chest x-ray 12/26/2015 FINDINGS: The heart and mediastinal contours are within normal limits. Atherosclerotic plaque. No focal consolidation. No pulmonary edema. No pleural effusion. No pneumothorax. No acute osseous abnormality. IMPRESSION: 1. No active disease. 2.  Aortic Atherosclerosis (ICD10-I70.0). Electronically Signed   By: Morgane  Naveau M.D.   On: 10/06/2023 18:01     Subjective: Patient seen examined bedside, sitting in bedside chair.  Just finished eating breakfast.  Pain controlled.  No complaints this morning.  Ready for discharge to SNF.  Denies headache, no dizziness, no chest pain, no palpitations, no shortness of breath, no abdominal pain, no fever/chills/night sweats, no nausea/vomiting/diarrhea, no focal weakness, no fatigue, no paresthesia.  No acute events overnight per nursing staff.  Discharge Exam: Vitals:   10/11/23 0527 10/11/23 0814  BP: (!) 154/96 128/79  Pulse: 93 89  Resp: 19 19  Temp: 98.2 F (36.8 C) 98.3 F (36.8 C)  SpO2: 95% 96%   Vitals:   10/10/23 1626 10/10/23 2029 10/11/23 0527 10/11/23 0814  BP: (!) 146/71 (!) 134/91 (!) 154/96 128/79  Pulse:  93 93 89  Resp: 18 18 19 19   Temp: 98.2 F (36.8 C) 98.5 F (36.9 C) 98.2 F (36.8 C) 98.3 F (36.8 C)  TempSrc:  Oral Oral   SpO2: 98% 97%  95% 96%  Weight:      Height:        Physical Exam: GEN: NAD, alert and oriented x 3, wd/wn HEENT: NCAT, PERRL, EOMI, sclera clear, MMM PULM: CTAB w/o wheezes/crackles, normal respiratory effort, on room air with SpO2 96% at rest CV: RRR w/o M/G/R GI: abd soft, NTND, + BS MSK: Trace bilateral lower extremity peripheral edema, moves all extremities independently with preserved muscle strength, left hip surgical incision site with dressing in place, clean/dry/intact NEURO: CN II-XII intact, no focal deficits, sensation to light touch intact PSYCH: normal mood/affect    The results of significant diagnostics from this hospitalization (including imaging, microbiology, ancillary and laboratory) are listed below for reference.     Microbiology: Recent Results (from the past 240 hours)  Urine Culture     Status: Abnormal   Collection Time: 10/06/23  7:05 PM   Specimen: Urine, Random  Result Value Ref Range Status   Specimen Description URINE, RANDOM  Final   Special Requests   Final    URINE, CLEAN CATCH Performed at [facility], [address] [place], [place] [address]    Culture 80,000 COLONIES/mL ENTEROCOCCUS FAECALIS (A)  Final   Report Status 10/08/2023 FINAL  Final   Organism ID, Bacteria ENTEROCOCCUS FAECALIS (A)  Final      Susceptibility   Enterococcus faecalis - MIC*    AMPICILLIN <=2 SENSITIVE Sensitive  NITROFURANTOIN <=16 SENSITIVE Sensitive     VANCOMYCIN 1 SENSITIVE Sensitive     * 80,000 COLONIES/mL ENTEROCOCCUS FAECALIS  Surgical pcr screen     Status: Abnormal   Collection Time: 10/07/23 12:44 AM   Specimen: Nasal Mucosa; Nasal Swab  Result Value Ref Range Status   MRSA, PCR POSITIVE (A) NEGATIVE Final    Comment: RESULT CALLED TO, READ BACK BY AND VERIFIED WITH: KARANGWA RN 10/07/2023 @ 0312    Staphylococcus aureus POSITIVE (A) NEGATIVE Final    Comment: (NOTE) The Xpert SA Assay (FDA approved for NASAL specimens in patients 28 years of age  and older), is one component of a comprehensive surveillance program. It is not intended to diagnose infection nor to guide or monitor treatment. Performed at [facility], [address], [place], [place] [address]      Labs: BNP (last 3 results) No results for input(s): BNP in the last 8760 hours. Basic Metabolic Panel: Recent Labs  Lab 10/06/23 1525 10/07/23 0607 10/07/23 1123 10/08/23 0518 10/09/23 0550 10/10/23 0521  NA 138  --  135 138 137 136  K 4.5  --  4.6 4.1 4.1 3.9  CL 104  --  105 104 106 101  CO2 22  --  22 24 23 26   GLUCOSE 251*  --  235* 160* 162* 167*  BUN 28*  --  26* 28* 26* 18  CREATININE 1.43* 1.26* 1.24 1.37* 1.16 1.13  CALCIUM 8.3*  --  7.5* 7.9* 7.7* 8.1*  MG  --   --  1.7 1.8 1.8 1.8   Liver Function Tests: Recent Labs  Lab 10/06/23 1525  AST 126*  ALT 44  ALKPHOS 88  BILITOT 1.4*  PROT 6.0*  ALBUMIN 3.4*   Recent Labs  Lab 10/06/23 1525  LIPASE 22   No results for input(s): AMMONIA in the last 168 hours. CBC: Recent Labs  Lab 10/06/23 1525 10/07/23 0607 10/08/23 0518 10/09/23 0550 10/10/23 0521  WBC 16.4* 14.4* 11.0* 8.5 8.1  NEUTROABS 14.3*  --   --   --   --   HGB 13.8 12.2* 11.3* 11.2* 11.7*  HCT 41.8 37.2* 34.7* 34.7* 35.9*  MCV 92.9 93.0 93.8 93.5 93.0  PLT 196 172 155 150 170   Cardiac Enzymes: Recent Labs  Lab 10/06/23 1525 10/08/23 0517 10/09/23 0550  CKTOTAL 5,820* 1,099* 510*   BNP: Invalid input(s): POCBNP CBG: Recent Labs  Lab 10/10/23 0738 10/10/23 1122 10/10/23 1618 10/10/23 2031 10/11/23 0815  GLUCAP 170* 207* 140* 189* 164*   D-Dimer No results for input(s): DDIMER in the last 72 hours. Hgb A1c No results for input(s): HGBA1C in the last 72 hours. Lipid Profile No results for input(s): CHOL, HDL, LDLCALC, TRIG, CHOLHDL, LDLDIRECT in the last 72 hours. Thyroid  function studies No results for input(s): TSH, T4TOTAL, T3FREE, THYROIDAB in the last 72  hours.  Invalid input(s): FREET3 Anemia work up No results for input(s): VITAMINB12, FOLATE, FERRITIN, TIBC, IRON, RETICCTPCT in the last 72 hours. Urinalysis    Component Value Date/Time   COLORURINE YELLOW 10/06/2023 1905   APPEARANCEUR HAZY (A) 10/06/2023 1905   LABSPEC 1.020 10/06/2023 1905   PHURINE 5.0 10/06/2023 1905   GLUCOSEU 50 (A) 10/06/2023 1905   HGBUR MODERATE (A) 10/06/2023 1905   BILIRUBINUR NEGATIVE 10/06/2023 1905   KETONESUR 5 (A) 10/06/2023 1905   PROTEINUR 100 (A) 10/06/2023 1905   UROBILINOGEN 0.2 07/29/2012 0951   NITRITE NEGATIVE 10/06/2023 1905   LEUKOCYTESUR SMALL (A)  10/06/2023 1905   Sepsis Labs Recent Labs  Lab 10/07/23 0607 10/08/23 0518 10/09/23 0550 10/10/23 0521  WBC 14.4* 11.0* 8.5 8.1   Microbiology Recent Results (from the past 240 hours)  Urine Culture     Status: Abnormal   Collection Time: 10/06/23  7:05 PM   Specimen: Urine, Random  Result Value Ref Range Status   Specimen Description URINE, RANDOM  Final   Special Requests   Final    URINE, CLEAN CATCH Performed at [facility], [address], [place], [place] [address]    Culture 80,000 COLONIES/mL ENTEROCOCCUS FAECALIS (A)  Final   Report Status 10/08/2023 FINAL  Final   Organism ID, Bacteria ENTEROCOCCUS FAECALIS (A)  Final      Susceptibility   Enterococcus faecalis - MIC*    AMPICILLIN <=2 SENSITIVE Sensitive     NITROFURANTOIN <=16 SENSITIVE Sensitive     VANCOMYCIN 1 SENSITIVE Sensitive     * 80,000 COLONIES/mL ENTEROCOCCUS FAECALIS  Surgical pcr screen     Status: Abnormal   Collection Time: 10/07/23 12:44 AM   Specimen: Nasal Mucosa; Nasal Swab  Result Value Ref Range Status   MRSA, PCR POSITIVE (A) NEGATIVE Final    Comment: RESULT CALLED TO, READ BACK BY AND VERIFIED WITH: KARANGWA RN 10/07/2023 @ 0312    Staphylococcus aureus POSITIVE (A) NEGATIVE Final    Comment: (NOTE) The Xpert SA Assay (FDA approved for NASAL specimens in patients  26 years of age and older), is one component of a comprehensive surveillance program. It is not intended to diagnose infection nor to guide or monitor treatment. Performed at [facility], [address] [place], [place] [address]      Time coordinating discharge: Over 30 minutes  SIGNED:   Rema Care [place], DO  [facility] 10/11/2023, 9:35 AM

## 2023-10-11 NOTE — Progress Notes (Addendum)
 1106- tried to call facility to give report, call was not answered   1142- tired to call facility again to give report, call was not answered   1218- Transport arrived for pt, tried calling to give report, called was not answered. Left a voicemail with name and a callback number   1236- Pt left with all belongings and paperwork

## 2024-02-24 ENCOUNTER — Inpatient Hospital Stay (HOSPITAL_COMMUNITY)
Admission: EM | Admit: 2024-02-24 | Discharge: 2024-02-27 | DRG: 871 | Disposition: A | Discharge status: Hospice | Attending: Internal Medicine | Admitting: Internal Medicine

## 2024-02-24 ENCOUNTER — Encounter (HOSPITAL_COMMUNITY): Payer: Self-pay

## 2024-02-24 ENCOUNTER — Emergency Department (HOSPITAL_COMMUNITY)

## 2024-02-24 DIAGNOSIS — M1990 Unspecified osteoarthritis, unspecified site: Secondary | ICD-10-CM | POA: Diagnosis present

## 2024-02-24 DIAGNOSIS — E785 Hyperlipidemia, unspecified: Secondary | ICD-10-CM | POA: Diagnosis present

## 2024-02-24 DIAGNOSIS — Z87820 Personal history of traumatic brain injury: Secondary | ICD-10-CM | POA: Diagnosis not present

## 2024-02-24 DIAGNOSIS — N1831 Chronic kidney disease, stage 3a: Secondary | ICD-10-CM | POA: Diagnosis present

## 2024-02-24 DIAGNOSIS — G9341 Metabolic encephalopathy: Secondary | ICD-10-CM | POA: Diagnosis present

## 2024-02-24 DIAGNOSIS — E8721 Acute metabolic acidosis: Secondary | ICD-10-CM | POA: Diagnosis present

## 2024-02-24 DIAGNOSIS — Z87891 Personal history of nicotine dependence: Secondary | ICD-10-CM | POA: Diagnosis not present

## 2024-02-24 DIAGNOSIS — J189 Pneumonia, unspecified organism: Principal | ICD-10-CM | POA: Diagnosis present

## 2024-02-24 DIAGNOSIS — E1122 Type 2 diabetes mellitus with diabetic chronic kidney disease: Secondary | ICD-10-CM | POA: Diagnosis present

## 2024-02-24 DIAGNOSIS — Z7189 Other specified counseling: Secondary | ICD-10-CM | POA: Diagnosis not present

## 2024-02-24 DIAGNOSIS — Z515 Encounter for palliative care: Secondary | ICD-10-CM | POA: Diagnosis not present

## 2024-02-24 DIAGNOSIS — I129 Hypertensive chronic kidney disease with stage 1 through stage 4 chronic kidney disease, or unspecified chronic kidney disease: Secondary | ICD-10-CM | POA: Diagnosis present

## 2024-02-24 DIAGNOSIS — F0390 Unspecified dementia without behavioral disturbance: Secondary | ICD-10-CM | POA: Diagnosis present

## 2024-02-24 DIAGNOSIS — Z789 Other specified health status: Secondary | ICD-10-CM | POA: Diagnosis not present

## 2024-02-24 DIAGNOSIS — M5431 Sciatica, right side: Secondary | ICD-10-CM | POA: Diagnosis present

## 2024-02-24 DIAGNOSIS — Z8249 Family history of ischemic heart disease and other diseases of the circulatory system: Secondary | ICD-10-CM | POA: Diagnosis not present

## 2024-02-24 DIAGNOSIS — Z9079 Acquired absence of other genital organ(s): Secondary | ICD-10-CM

## 2024-02-24 DIAGNOSIS — G4733 Obstructive sleep apnea (adult) (pediatric): Secondary | ICD-10-CM | POA: Diagnosis present

## 2024-02-24 DIAGNOSIS — I4891 Unspecified atrial fibrillation: Secondary | ICD-10-CM | POA: Diagnosis not present

## 2024-02-24 DIAGNOSIS — Z8673 Personal history of transient ischemic attack (TIA), and cerebral infarction without residual deficits: Secondary | ICD-10-CM

## 2024-02-24 DIAGNOSIS — Z1152 Encounter for screening for COVID-19: Secondary | ICD-10-CM | POA: Diagnosis not present

## 2024-02-24 DIAGNOSIS — Z66 Do not resuscitate: Secondary | ICD-10-CM | POA: Diagnosis present

## 2024-02-24 DIAGNOSIS — R6520 Severe sepsis without septic shock: Secondary | ICD-10-CM | POA: Diagnosis present

## 2024-02-24 DIAGNOSIS — Z7984 Long term (current) use of oral hypoglycemic drugs: Secondary | ICD-10-CM | POA: Diagnosis not present

## 2024-02-24 DIAGNOSIS — Z9842 Cataract extraction status, left eye: Secondary | ICD-10-CM

## 2024-02-24 DIAGNOSIS — E875 Hyperkalemia: Secondary | ICD-10-CM | POA: Diagnosis present

## 2024-02-24 DIAGNOSIS — Z9841 Cataract extraction status, right eye: Secondary | ICD-10-CM

## 2024-02-24 DIAGNOSIS — Z79899 Other long term (current) drug therapy: Secondary | ICD-10-CM | POA: Diagnosis not present

## 2024-02-24 DIAGNOSIS — I480 Paroxysmal atrial fibrillation: Secondary | ICD-10-CM | POA: Diagnosis present

## 2024-02-24 DIAGNOSIS — R0602 Shortness of breath: Secondary | ICD-10-CM | POA: Diagnosis present

## 2024-02-24 DIAGNOSIS — G8929 Other chronic pain: Secondary | ICD-10-CM | POA: Diagnosis present

## 2024-02-24 DIAGNOSIS — N179 Acute kidney failure, unspecified: Secondary | ICD-10-CM | POA: Diagnosis present

## 2024-02-24 DIAGNOSIS — Z96653 Presence of artificial knee joint, bilateral: Secondary | ICD-10-CM | POA: Diagnosis present

## 2024-02-24 DIAGNOSIS — Z823 Family history of stroke: Secondary | ICD-10-CM

## 2024-02-24 DIAGNOSIS — Z9049 Acquired absence of other specified parts of digestive tract: Secondary | ICD-10-CM

## 2024-02-24 DIAGNOSIS — Z961 Presence of intraocular lens: Secondary | ICD-10-CM | POA: Diagnosis present

## 2024-02-24 DIAGNOSIS — A419 Sepsis, unspecified organism: Principal | ICD-10-CM | POA: Diagnosis present

## 2024-02-24 DIAGNOSIS — F419 Anxiety disorder, unspecified: Secondary | ICD-10-CM | POA: Diagnosis present

## 2024-02-24 LAB — COMPREHENSIVE METABOLIC PANEL WITH GFR
ALT: 56 U/L — ABNORMAL HIGH (ref 0–44)
AST: 40 U/L (ref 15–41)
Albumin: 2.1 g/dL — ABNORMAL LOW (ref 3.5–5.0)
Alkaline Phosphatase: 69 U/L (ref 38–126)
Anion gap: 17 — ABNORMAL HIGH (ref 5–15)
BUN: 43 mg/dL — ABNORMAL HIGH (ref 8–23)
CO2: 18 mmol/L — ABNORMAL LOW (ref 22–32)
Calcium: 7.3 mg/dL — ABNORMAL LOW (ref 8.9–10.3)
Chloride: 98 mmol/L (ref 98–111)
Creatinine, Ser: 1.74 mg/dL — ABNORMAL HIGH (ref 0.61–1.24)
GFR, Estimated: 37 mL/min — ABNORMAL LOW (ref >60)
Glucose, Bld: 105 mg/dL — ABNORMAL HIGH (ref 70–99)
Potassium: 5.5 mmol/L — ABNORMAL HIGH (ref 3.5–5.1)
Sodium: 133 mmol/L — ABNORMAL LOW (ref 135–145)
Total Bilirubin: 0.6 mg/dL (ref 0.0–1.2)
Total Protein: 5.3 g/dL — ABNORMAL LOW (ref 6.5–8.1)

## 2024-02-24 LAB — LACTIC ACID, PLASMA
Lactic Acid, Venous: 2.7 mmol/L (ref 0.5–1.9)
Lactic Acid, Venous: 2.4 mmol/L (ref 0.5–1.9)

## 2024-02-24 LAB — CBC WITH DIFFERENTIAL/PLATELET
Abs Immature Granulocytes: 0.17 K/uL — ABNORMAL HIGH (ref 0.00–0.07)
Basophils Absolute: 0.1 K/uL (ref 0.0–0.1)
Basophils Relative: 1 %
Eosinophils Absolute: 0.2 K/uL (ref 0.0–0.5)
Eosinophils Relative: 1 %
HCT: 45.6 % (ref 39.0–52.0)
Hemoglobin: 14.3 g/dL (ref 13.0–17.0)
Immature Granulocytes: 1 %
Lymphocytes Relative: 7 %
Lymphs Abs: 1.3 K/uL (ref 0.7–4.0)
MCH: 27.3 pg (ref 26.0–34.0)
MCHC: 31.4 g/dL (ref 30.0–36.0)
MCV: 87.2 fL (ref 80.0–100.0)
Monocytes Absolute: 1.1 K/uL — ABNORMAL HIGH (ref 0.1–1.0)
Monocytes Relative: 6 %
Neutro Abs: 15.9 K/uL — ABNORMAL HIGH (ref 1.7–7.7)
Neutrophils Relative %: 84 %
Platelets: 301 K/uL (ref 150–400)
RBC: 5.23 MIL/uL (ref 4.22–5.81)
RDW: 16.4 % — ABNORMAL HIGH (ref 11.5–15.5)
Smear Review: NORMAL
WBC: 18.7 K/uL — ABNORMAL HIGH (ref 4.0–10.5)
nRBC: 0.1 % (ref 0.0–0.2)

## 2024-02-24 LAB — CBC
HCT: 42.3 % (ref 39.0–52.0)
Hemoglobin: 13.4 g/dL (ref 13.0–17.0)
MCH: 27.4 pg (ref 26.0–34.0)
MCHC: 31.7 g/dL (ref 30.0–36.0)
MCV: 86.5 fL (ref 80.0–100.0)
Platelets: 324 K/uL (ref 150–400)
RBC: 4.89 MIL/uL (ref 4.22–5.81)
RDW: 16.1 % — ABNORMAL HIGH (ref 11.5–15.5)
WBC: 18.9 K/uL — ABNORMAL HIGH (ref 4.0–10.5)
nRBC: 0.2 % (ref 0.0–0.2)

## 2024-02-24 LAB — URINALYSIS, W/ REFLEX TO CULTURE (INFECTION SUSPECTED)
Glucose, UA: NEGATIVE mg/dL
Hgb urine dipstick: NEGATIVE
Ketones, ur: 15 mg/dL — AB
Leukocytes,Ua: NEGATIVE
Nitrite: NEGATIVE
Protein, ur: NEGATIVE mg/dL
Specific Gravity, Urine: >1.03 — ABNORMAL HIGH (ref 1.005–1.030)
pH: 5.5 (ref 5.0–8.0)

## 2024-02-24 LAB — PROTIME-INR
INR: 1.2 (ref 0.8–1.2)
Prothrombin Time: 15.8 s — ABNORMAL HIGH (ref 11.4–15.2)

## 2024-02-24 LAB — I-STAT CG4 LACTIC ACID, ED
Lactic Acid, Venous: 2.9 mmol/L (ref 0.5–1.9)
Lactic Acid, Venous: 2.3 mmol/L (ref 0.5–1.9)

## 2024-02-24 LAB — GLUCOSE, CAPILLARY: Glucose-Capillary: 94 mg/dL (ref 70–99)

## 2024-02-24 LAB — CBG MONITORING, ED: Glucose-Capillary: 92 mg/dL (ref 70–99)

## 2024-02-24 LAB — RESP PANEL BY RT-PCR (RSV, FLU A&B, COVID)  RVPGX2
Influenza A by PCR: NEGATIVE
Influenza B by PCR: NEGATIVE
Resp Syncytial Virus by PCR: NEGATIVE
SARS Coronavirus 2 by RT PCR: NEGATIVE

## 2024-02-24 LAB — BRAIN NATRIURETIC PEPTIDE: B Natriuretic Peptide: 459.6 pg/mL — ABNORMAL HIGH (ref 0.0–100.0)

## 2024-02-24 MED ORDER — CEFTRIAXONE SODIUM 1 G IJ SOLR
1.0000 g | Freq: Once | INTRAMUSCULAR | Status: AC
  Administered 2024-02-24: 1 g via INTRAVENOUS
  Filled 2024-02-24: qty 10

## 2024-02-24 MED ORDER — QUETIAPINE FUMARATE 25 MG PO TABS
25.0000 mg | ORAL_TABLET | Freq: Every day | ORAL | Status: DC
  Administered 2024-02-24 – 2024-02-25 (×2): 25 mg via ORAL
  Filled 2024-02-24 (×2): qty 1

## 2024-02-24 MED ORDER — SODIUM CHLORIDE 0.9 % IV SOLN
2.0000 g | INTRAVENOUS | Status: DC
  Administered 2024-02-24 – 2024-02-25 (×2): 2 g via INTRAVENOUS
  Filled 2024-02-24 (×2): qty 20

## 2024-02-24 MED ORDER — SODIUM ZIRCONIUM CYCLOSILICATE 5 G PO PACK
5.0000 g | PACK | Freq: Once | ORAL | Status: AC
  Administered 2024-02-24: 5 g via ORAL
  Filled 2024-02-24: qty 1

## 2024-02-24 MED ORDER — ACETAMINOPHEN 500 MG PO TABS
500.0000 mg | ORAL_TABLET | Freq: Four times a day (QID) | ORAL | Status: DC | PRN
  Administered 2024-02-24: 500 mg via ORAL
  Filled 2024-02-24: qty 1

## 2024-02-24 MED ORDER — ENOXAPARIN SODIUM 40 MG/0.4ML IJ SOSY
40.0000 mg | PREFILLED_SYRINGE | INTRAMUSCULAR | Status: DC
  Administered 2024-02-24 – 2024-02-26 (×3): 40 mg via SUBCUTANEOUS
  Filled 2024-02-24 (×3): qty 0.4

## 2024-02-24 MED ORDER — LACTATED RINGERS IV SOLN: INTRAVENOUS | Status: DC

## 2024-02-24 MED ORDER — SODIUM CHLORIDE 0.9 % IV BOLUS
500.0000 mL | Freq: Once | INTRAVENOUS | Status: AC
  Administered 2024-02-24: 500 mL via INTRAVENOUS

## 2024-02-24 MED ORDER — SODIUM CHLORIDE 0.9 % IV SOLN
500.0000 mg | Freq: Once | INTRAVENOUS | Status: AC
  Administered 2024-02-24: 500 mg via INTRAVENOUS
  Filled 2024-02-24: qty 5

## 2024-02-24 MED ORDER — IPRATROPIUM-ALBUTEROL 0.5-2.5 (3) MG/3ML IN SOLN
3.0000 mL | Freq: Four times a day (QID) | RESPIRATORY_TRACT | Status: DC
  Administered 2024-02-24 – 2024-02-25 (×4): 3 mL via RESPIRATORY_TRACT
  Filled 2024-02-24 (×4): qty 3

## 2024-02-24 MED ORDER — HALOPERIDOL LACTATE 5 MG/ML IJ SOLN
2.0000 mg | Freq: Once | INTRAMUSCULAR | Status: AC
  Administered 2024-02-24: 2 mg via INTRAMUSCULAR
  Filled 2024-02-24: qty 1

## 2024-02-24 MED ORDER — INSULIN ASPART 100 UNIT/ML IJ SOLN
0.0000 [IU] | Freq: Every day | INTRAMUSCULAR | Status: DC
  Filled 2024-02-24: qty 2

## 2024-02-24 MED ORDER — INSULIN ASPART 100 UNIT/ML IJ SOLN
0.0000 [IU] | Freq: Three times a day (TID) | INTRAMUSCULAR | Status: DC
  Administered 2024-02-25: 2 [IU] via SUBCUTANEOUS
  Administered 2024-02-25 – 2024-02-26 (×2): 1 [IU] via SUBCUTANEOUS
  Filled 2024-02-24: qty 1
  Filled 2024-02-24: qty 2

## 2024-02-24 MED ORDER — SODIUM CHLORIDE 0.9 % IV SOLN
500.0000 mg | INTRAVENOUS | Status: DC
Start: 2024-02-24 — End: 2024-02-24

## 2024-02-24 MED ORDER — DOXYCYCLINE HYCLATE 100 MG PO TABS
100.0000 mg | ORAL_TABLET | Freq: Two times a day (BID) | ORAL | Status: DC
  Administered 2024-02-24 – 2024-02-26 (×5): 100 mg via ORAL
  Filled 2024-02-24 (×5): qty 1

## 2024-02-24 MED ORDER — PROCHLORPERAZINE EDISYLATE 10 MG/2ML IJ SOLN: 5.0000 mg | Freq: Four times a day (QID) | INTRAMUSCULAR | Status: DC | PRN

## 2024-02-24 MED ORDER — GUAIFENESIN-DM 100-10 MG/5ML PO SYRP
5.0000 mL | ORAL_SOLUTION | ORAL | Status: DC | PRN
  Administered 2024-02-25: 5 mL via ORAL
  Filled 2024-02-24: qty 5

## 2024-02-24 MED ORDER — SODIUM CHLORIDE 0.9 % IV BOLUS
1000.0000 mL | Freq: Once | INTRAVENOUS | Status: AC
  Administered 2024-02-24: 1000 mL via INTRAVENOUS

## 2024-02-24 MED ORDER — POLYETHYLENE GLYCOL 3350 17 G PO PACK: 17.0000 g | PACK | Freq: Every day | ORAL | Status: DC | PRN

## 2024-02-24 MED ORDER — SIMVASTATIN 20 MG PO TABS
20.0000 mg | ORAL_TABLET | Freq: Every day | ORAL | Status: DC
  Administered 2024-02-24 – 2024-02-25 (×2): 20 mg via ORAL
  Filled 2024-02-24 (×2): qty 1

## 2024-02-24 MED ORDER — IPRATROPIUM-ALBUTEROL 0.5-2.5 (3) MG/3ML IN SOLN
3.0000 mL | RESPIRATORY_TRACT | Status: DC | PRN
Start: 2024-02-24 — End: 2024-02-27

## 2024-02-24 MED ORDER — MELATONIN 5 MG PO TABS
5.0000 mg | ORAL_TABLET | Freq: Every evening | ORAL | Status: DC | PRN
  Administered 2024-02-25: 5 mg via ORAL
  Filled 2024-02-24: qty 1

## 2024-02-24 NOTE — ED Notes (Signed)
 Report given to Mose, CHARITY FUNDRAISER. Pt refusing IV and blood draw at this time.

## 2024-02-24 NOTE — Progress Notes (Signed)
 Patients caregiver states he doesn't wear a CPAP at home. Will be on standby

## 2024-02-24 NOTE — Evaluation (Signed)
 Physical Therapy Evaluation Patient Details Name: [name] MRN: [id number] DOB: 01/15/1932 Today's Date: 02/24/2024  History of Present Illness  Pt is 89 year old presented to [facility] on  02/24/24 for SOB, lethargy, and tachycardia. Pt with PNA. Became agitated in ED. PMH - lt hip fx, arthritis, cardiac arrythmia, HTN, R side sciatica, DMII, bil knee surgeries, L shoulder surgery.  Clinical Impression  Pt admitted with above diagnosis and presents to PT with functional limitations due to deficits listed below (See PT problem list). Pt needs skilled PT to maximize independence and safety. Pt weak and confused and requiring assist for all mobility and unable to amb today. Lives alone. Patient will benefit from continued inpatient follow up therapy, <3 hours/day.           If plan is discharge home, recommend the following: A lot of help with walking and/or transfers;A lot of help with bathing/dressing/bathroom;Direct supervision/assist for medications management;Direct supervision/assist for financial management;Assist for transportation;Help with stairs or ramp for entrance   Can travel by private vehicle   No    Equipment Recommendations None recommended by PT  Recommendations for Other Services       Functional Status Assessment Patient has had a recent decline in their functional status and demonstrates the ability to make significant improvements in function in a reasonable and predictable amount of time.     Precautions / Restrictions Precautions Precautions: Fall Restrictions Weight Bearing Restrictions Per Provider Order: No      Mobility  Bed Mobility Overal bed mobility: Needs Assistance Bed Mobility: Supine to Sit, Sit to Supine     Supine to sit: Mod assist, HOB elevated Sit to supine: Mod assist   General bed mobility comments: Assist due to poor cognition and weakness    Transfers Overall transfer level: Needs assistance Equipment used: Rollator (4  wheels) Transfers: Sit to/from Stand Sit to Stand: Mod assist, From elevated surface           General transfer comment: Assist to power up and balance. Posterior bias    Ambulation/Gait               General Gait Details: Unable  Stairs            Wheelchair Mobility     Tilt Bed    Modified Rankin (Stroke Patients Only)       Balance Overall balance assessment: Needs assistance Sitting-balance support: No upper extremity supported, Feet supported Sitting balance-Leahy Scale: Fair     Standing balance support: Bilateral upper extremity supported, During functional activity, Reliant on assistive device for balance Standing balance-Leahy Scale: Poor Standing balance comment: UE support and min assist for static standing                             Pertinent Vitals/Pain Pain Assessment Pain Assessment: Faces Faces Pain Scale: No hurt    Home Living Family/patient expects to be discharged to:: Private residence Living Arrangements: Alone Available Help at Discharge: Family;Available PRN/intermittently Type of Home: House Home Access: Stairs to enter Entrance Stairs-Rails: Right Entrance Stairs-Number of Steps: 2 Alternate Level Stairs-Number of Steps: flight Home Layout: Two level;Able to live on main level with bedroom/bathroom Home Equipment: Rolling Walker (2 wheels);Cane - single point;Crutches;Grab bars - tub/shower;BSC/3in1;Shower seat      Prior Function Prior Level of Function : Independent/Modified Independent             Mobility Comments: Amb with  rolling walker       Extremity/Trunk Assessment   Upper Extremity Assessment Upper Extremity Assessment: Defer to OT evaluation    Lower Extremity Assessment Lower Extremity Assessment: Generalized weakness       Communication   Communication Communication: Impaired Factors Affecting Communication: Hearing impaired    Cognition Arousal: Alert Behavior During  Therapy: Flat affect   PT - Cognitive impairments: Orientation, Awareness, Memory, Attention, Problem solving, Safety/Judgement   Orientation impairments: Place, Time, Situation                   PT - Cognition Comments: Pt thought he was in car going to church Following commands: Impaired Following commands impaired: Follows one step commands inconsistently, Follows one step commands with increased time     Cueing Cueing Techniques: Verbal cues, Tactile cues     General Comments General comments (skin integrity, edema, etc.): HR to 130's with activity. On 3L O2    Exercises     Assessment/Plan    PT Assessment Patient needs continued PT services  PT Problem List Decreased strength;Decreased activity tolerance;Decreased balance;Decreased mobility;Decreased cognition;Decreased safety awareness       PT Treatment Interventions DME instruction;Gait training;Functional mobility training;Therapeutic activities;Therapeutic exercise;Balance training;Cognitive remediation;Patient/family education    PT Goals (Current goals can be found in the Care Plan section)  Acute Rehab PT Goals Patient Stated Goal: Pt unable to state PT Goal Formulation: Patient unable to participate in goal setting Time For Goal Achievement: 03/09/24 Potential to Achieve Goals: Good    Frequency Min 2X/week     Co-evaluation               AM-PAC PT 6 Clicks Mobility  Outcome Measure Help needed turning from your back to your side while in a flat bed without using bedrails?: A Lot Help needed moving from lying on your back to sitting on the side of a flat bed without using bedrails?: A Lot Help needed moving to and from a bed to a chair (including a wheelchair)?: Total Help needed standing up from a chair using your arms (e.g., wheelchair or bedside chair)?: A Lot Help needed to walk in hospital room?: Total Help needed climbing 3-5 steps with a railing? : Total 6 Click Score: 9    End  of Session Equipment Utilized During Treatment: Gait belt;Oxygen  Activity Tolerance: Patient limited by fatigue Patient left: in bed;with call bell/phone within reach Nurse Communication: Mobility status PT Visit Diagnosis: Other abnormalities of gait and mobility (R26.89);Unsteadiness on feet (R26.81);Muscle weakness (generalized) (M62.81);History of falling (Z91.81)    Time: 8992-8972 PT Time Calculation (min) (ACUTE ONLY): 20 min   Charges:   PT Evaluation $PT Eval Moderate Complexity: 1 Mod   PT General Charges $$ ACUTE PT VISIT: 1 Visit         [facility] PT Acute Rehabilitation Services Office [phone]   Rodgers ORN [facility] 02/24/2024, 11:54 AM

## 2024-02-24 NOTE — ED Notes (Signed)
 CCMD called.

## 2024-02-24 NOTE — ED Notes (Signed)
 Pt pulled two Ivs out, had all cardiac monitoring off and pulse ox. PA aware that pt does not have IV or pulse ox on as he refuses and when attempted to replace would pull it right off. Pt is tolerating cardiac monitoring at this time, will continue to try and put pulse ox back on

## 2024-02-24 NOTE — ED Triage Notes (Signed)
 Pt arrives with EMS for rapid heart rate possible new onset afib, pale face. home health nurse called, pt reports just wanting to sleep H/o cognitive impairment  Unsure of date but aware of place and person  HR 100-140 irregular High 80s RA, on 3L simple mask 96%  101/66 16 RR

## 2024-02-24 NOTE — ED Provider Notes (Signed)
 [place] EM[facility] AT [facility] Provider Note   CSN: [id number] Arrival date & time: 02/24/24  0036     Patient presents with: Irregular Heart Beat   [name] is a 89 y.o. male.   89 yo male brought in by EMS from home, called by home health nurse for rapid heart rate. History of a-fib, HTN, HLD, DM2, CKD stage IIIa, BPH, osteoarthritis, OSA on nocturnal CPAP, cognitive impairment.   Call from Mia, home health nurse, 3 days of increased lethargy, SHOB. Tonight, patient more incoherent than usual (history of dementia), slurring words and not making much sense. Seen by Mia yesterday, his HR has been irregular, 90s yesterday, awaiting on mobile EKG. Also concerned more pale than usual. No reports of dark/bloody stools. Has had decreased PO intake today, decreased urine output. No fevers or falls. Living at residential care home (independent care home). DNR per family yesterday, provider has signed the DNR but patient's name was not on the DNR, not in hospice care. Family is local, facility tried to contact family unsuccessfully.        Prior to Admission medications   Medication Sig Start Date End Date Taking? Authorizing Provider  acetaminophen  (TYLENOL ) 500 MG tablet Take 500 mg by mouth 2 (two) times daily as needed for moderate pain (pain score 4-6), fever or headache.    [provider]  aspirin  (BAYER ASPIRIN ) 325 MG tablet Take 1 tablet by mouth for 30 DAYS for blood clot prevention 10/09/23   Jordan, Jesse J, PA-C  HYDROcodone -acetaminophen  (NORCO/VICODIN) 5-325 MG tablet Take 1 tablet by mouth every 6 (six) hours as needed (for post op pain). 10/09/23   Jordan, Jesse J, PA-C  metFORMIN  (GLUCOPHAGE ) 500 MG tablet Take 500 mg by mouth with breakfast, with lunch, and with evening meal.    [provider]  mirabegron  ER (MYRBETRIQ ) 50 MG TB24 tablet Take 50 mg by mouth at bedtime.    [provider]  simvastatin  (ZOCOR ) 20 MG tablet  Take 20 mg by mouth at bedtime.    [provider]    Allergies: Patient has no known allergies.    Review of Systems Level 5 caveat for poor historian/dementia   Updated Vital Signs BP (!) 100/59   Pulse 79   Temp 97.6 F (36.4 C) (Rectal)   Resp 16   Ht 5' 10.98 (1.803 m)   Wt 92.2 kg   SpO2 92%   BMI 28.36 kg/m   Physical Exam Vitals and nursing note reviewed.  Constitutional:      General: He is not in acute distress.    Appearance: He is well-developed. He is not diaphoretic.     Comments: Rouses to verbal stimuli, able to state name  HENT:     Head: Normocephalic and atraumatic.     Mouth/Throat:     Mouth: Mucous membranes are dry.  Eyes:     Conjunctiva/sclera: Conjunctivae normal.  Cardiovascular:     Rate and Rhythm: Tachycardia present. Rhythm irregular.     Pulses: Normal pulses.     Heart sounds: Normal heart sounds.  Pulmonary:     Effort: Pulmonary effort is normal.     Breath sounds: Normal breath sounds.  Abdominal:     Palpations: Abdomen is soft.     Tenderness: There is no abdominal tenderness.  Musculoskeletal:        General: No swelling, tenderness or signs of injury.     Right lower leg: No edema.  Left lower leg: No edema.  Skin:    General: Skin is warm and dry.  Neurological:     General: No focal deficit present.  Psychiatric:        Behavior: Behavior normal.     (all labs ordered are listed, but only abnormal results are displayed) Labs Reviewed  CBC WITH DIFFERENTIAL/PLATELET - Abnormal; Notable for the following components:      Result Value   WBC 18.7 (*)    RDW 16.4 (*)    Neutro Abs 15.9 (*)    Monocytes Absolute 1.1 (*)    Abs Immature Granulocytes 0.17 (*)    All other components within normal limits  PROTIME-INR - Abnormal; Notable for the following components:   Prothrombin Time 15.8 (*)    All other components within normal limits  URINALYSIS, W/ REFLEX TO CULTURE (INFECTION SUSPECTED) - Abnormal;  Notable for the following components:   Specific Gravity, Urine >1.030 (*)    Bilirubin Urine SMALL (*)    Ketones, ur 15 (*)    Bacteria, UA RARE (*)    All other components within normal limits  COMPREHENSIVE METABOLIC PANEL WITH GFR - Abnormal; Notable for the following components:   Sodium 133 (*)    Potassium 5.5 (*)    CO2 18 (*)    Glucose, Bld 105 (*)    BUN 43 (*)    Creatinine, Ser 1.74 (*)    Calcium 7.3 (*)    Total Protein 5.3 (*)    Albumin 2.1 (*)    ALT 56 (*)    GFR, Estimated 37 (*)    Anion gap 17 (*)    All other components within normal limits  I-STAT CG4 LACTIC ACID, ED - Abnormal; Notable for the following components:   Lactic Acid, Venous 2.9 (*)    All other components within normal limits  I-STAT CG4 LACTIC ACID, ED - Abnormal; Notable for the following components:   Lactic Acid, Venous 2.3 (*)    All other components within normal limits  RESP PANEL BY RT-PCR (RSV, FLU A&B, COVID)  [id number]  CULTURE, BLOOD (ROUTINE X 2)  CULTURE, BLOOD (ROUTINE X 2)  CBC  BRAIN NATRIURETIC PEPTIDE    EKG: None  Radiology: DG Chest Port 1 View Result Date: 02/24/2024 EXAM: 1 VIEW XRAY OF THE CHEST 02/24/2024 01:05:22 AM COMPARISON: 10/06/2023 CLINICAL HISTORY: Questionable sepsis - evaluate for abnormality FINDINGS: LUNGS AND PLEURA: Patchy bilateral mid and lower lung airspace opacities. This could reflect edema or infection. No pleural effusion. No pneumothorax. HEART AND MEDIASTINUM: Mild cardiomegaly, vascular congestion. BONES AND SOFT TISSUES: No acute osseous abnormality. IMPRESSION: 1. Patchy bilateral mid and lower lung airspace opacities, possibly representing edema or infection. 2. Mild cardiomegaly with vascular congestion. Electronically signed by: Franky Crease MD 02/24/2024 01:13 AM EDT RP Workstation: [id number]     Procedures   Medications Ordered in the ED  azithromycin (ZITHROMAX) 500 mg in sodium chloride  0.9 % 250 mL IVPB (has no administration  in time range)  enoxaparin  (LOVENOX ) injection 40 mg (has no administration in time range)  cefTRIAXone (ROCEPHIN) 2 g in sodium chloride  0.9 % 100 mL IVPB (has no administration in time range)  azithromycin (ZITHROMAX) 500 mg in sodium chloride  0.9 % 250 mL IVPB (has no administration in time range)  acetaminophen  (TYLENOL ) tablet 500 mg (has no administration in time range)  prochlorperazine (COMPAZINE) injection 5 mg (has no administration in time range)  polyethylene glycol (MIRALAX / GLYCOLAX) packet 17 g (  has no administration in time range)  melatonin tablet 5 mg (has no administration in time range)  ipratropium-albuterol (DUONEB) 0.5-2.5 (3) MG/3ML nebulizer solution 3 mL (has no administration in time range)  ipratropium-albuterol (DUONEB) 0.5-2.5 (3) MG/3ML nebulizer solution 3 mL (has no administration in time range)  guaiFENesin-dextromethorphan (ROBITUSSIN DM) 100-10 MG/5ML syrup 5 mL (has no administration in time range)  lactated ringers  infusion (has no administration in time range)  sodium chloride  0.9 % bolus 1,000 mL (0 mLs Intravenous Stopped 02/24/24 0217)  cefTRIAXone (ROCEPHIN) 1 g in sodium chloride  0.9 % 100 mL IVPB (0 g Intravenous Stopped 02/24/24 0218)  haloperidol lactate (HALDOL) injection 2 mg (2 mg Intramuscular Given 02/24/24 0501)  sodium zirconium cyclosilicate (LOKELMA) packet 5 g (5 g Oral Given 02/24/24 0501)                                    Medical Decision Making Amount and/or Complexity of Data Reviewed Labs: ordered. Radiology: ordered.  Risk Prescription drug management. Decision regarding hospitalization.   This patient presents to the ED for concern of change in mental status/lethargy, decreased PO intake, decreased urine output, this involves an extensive number of treatment options, and is a complaint that carries with it a high risk of complications and morbidity.  The differential diagnosis includes but not limited to UTI, PNA, sepsis,  arrhythmia, metabolic/electrolyte    Co morbidities / Chronic conditions that complicate the patient evaluation  Diabetes, A-fib, hypertension, hyperlipidemia   Additional history obtained:  Additional history obtained from EMR External records from outside source obtained and reviewed including prior labs, imaging, history of bile   Lab Tests:  I Ordered, and personally interpreted labs.  The pertinent results include: CBC with leukocytosis white count of 18.7 with increase in neutrophils.  CMP with hyperkalemia with potassium of 5.5, AKI with creatinine of 1.74.  Lactic acid is elevated at 2.9, repeat to 2.3.  Urinalysis with bilirubin and ketones, does not suggest infection.  Negative for COVID, flu, RSV.   Imaging Studies ordered:  I ordered imaging studies including chest x-ray I independently visualized and interpreted imaging which showed pneumonia I agree with the radiologist interpretation   Cardiac Monitoring: / EKG:  The patient was maintained on a cardiac monitor.  I personally viewed and interpreted the cardiac monitored which showed an underlying rhythm of: A-fib, rate 116   Problem List / ED Course / Critical interventions / Medication management  89 year old male brought in by EMS from independent living facility where he has been noted to be more lethargic with decreased p.o. intake and decreased urine output by staff.  They became concerned when his heart rate was elevated and variable.  They were unaware of his prior history of A-fib, he is not anticoagulated.  Patient was evaluated, found to have a pneumonia with increase in WBC, creatinine with elevated potassium at 5.5.  His lactic acid was elevated, somewhat improved with IV fluids.  Urine does not suggest infection and he is negative for COVID, flu, RSV.  He is provided with antibiotics for his pneumonia.  Provided with Lokelma for his mildly elevated potassium, IV fluids for his AKI and Haldol for his  agitation.  He is admitted to the hospital for further management. HR improved with IVF.  Plan of care discussed with patient's daughter and patient's brother at the bedside.  Patient and family request DNR, have a DNR order  at the facility however the facility noted that the patient's name was not on the paperwork and was therefore invalid.  I discussed this with the patient and family, do not wish to be intubated/resuscitated. I ordered medication including Rocephin, Zithromax, Haldol, Lokelma Reevaluation of the patient after these medicines showed that the patient initially had removed his IV lines however after the Haldol, is sleeping comfortably and tolerating IV fluids/antibiotics I have reviewed the patients home medicines and have made adjustments as needed   Consultations Obtained:  I requested consultation with the hospitalist, Dr. Shona,  and discussed lab and imaging findings as well as pertinent plan - they recommend: Will consult for admission   Social Determinants of Health:  Lives at long-term care facility   Test / Admission - Considered:  Admit      Final diagnoses:  Pneumonia due to infectious organism, unspecified laterality, unspecified part of lung  Atrial fibrillation with RVR (HCC)  Sepsis with acute organ dysfunction without septic shock, due to unspecified organism, unspecified organ dysfunction type (HCC)  Hyperkalemia  AKI (acute kidney injury)    ED Discharge Orders     None          Beverley Leita LABOR, PA-C 02/24/24 0636    Theadore Ozell HERO, MD 02/24/24 [phone]

## 2024-02-24 NOTE — H&P (Addendum)
 History and Physical  [name]:[id number] DOB: [birth year] DOA: 02/24/2024  Referring physician: Beverley Doffing, PA-EDP  PCP: [name], Ravisankar, MD  Outpatient Specialists: Vascular surgery, cardiology. Patient coming from: Home.  Chief Complaint: Shortness of breath and lethargy.  HPI: [name] is a 89 y.o. male with medical history significant for hypertension, hyperlipidemia, type 2 diabetes, CKD 3A, BPH, OSA on nocturnal CPAP, traumatic subdural hemorrhage, TIA, who presented to the ER due to 3 days of increased lethargy and shortness of breath.  Associated with low urine output, and tachycardia.  EMS was called by home health RN due to elevated heart rate in the 150s.  Upon EMS arrival, the patient was in A-fib with RVR.  In the ED, confused, agitated and removing his IV access and cardiac leads.  Twelve-lead EKG showing A-fib with RVR.  Tachycardic and tachypneic.  Chest x-ray revealing bilateral pulmonary infiltrates suggestive of pneumonia.  The patient received empiric IV antibiotics for CAP, Rocephin and IV azithromycin.  Admitted by [name], hospitalist service.  At the time of this visit the patient is somnolent after receiving Haldol due to agitation, for his own safety.  ED Course: Temperature 97.6.  BP 109/85, pulse 109, respiration rate 23, O2 saturation 92% on room air.  Review of Systems: Review of systems as noted in the HPI. All other systems reviewed and are negative.   Past Medical History:  Diagnosis Date   Allergic rhinitis    Anxiety    Arthritis    Benign localized prostatic hyperplasia with lower urinary tract symptoms (LUTS)    Chronic back pain    treated with epidural injections   Complication of anesthesia    delirium with infection post op back surgery 04-01-2008    H/O hiatal hernia    History of cardiac arrhythmia    event monitor 01-18-2016 in epic (per pt heart rate drops),  showed SR, SB, rare episodes intercalated PVCs with no evidence  high grade ectopy or atrial fib   Hyperlipidemia    Hypertension    followed by pcp   Sciatica of right side    Type 2 diabetes mellitus (HCC)    followed by pcp  (03-04-2019  check's cbg 3 times per week,  fasting cbg 109-140)   Upper airway resistance syndrome    per cardiologist , dr burnard, note in [facility] 11-13-2018  dx from study done 2017 in epic    Urethral stricture    Past Surgical History:  Procedure Laterality Date   APPENDECTOMY  age 73   CATARACT EXTRACTION W/ INTRAOCULAR LENS  IMPLANT, BILATERAL Bilateral 2017; 2018   CLOSED REDUCTION GREATER HUMERAL TUBEROSITY FRACTURE Left 06-26-2009   @[facility]    INTRAMEDULLARY (IM) NAIL INTERTROCHANTERIC Left 10/06/2023   Procedure: FIXATION, FRACTURE, INTERTROCHANTERIC, WITH INTRAMEDULLARY ROD;  Surgeon: Elsa Lonni SAUNDERS, MD;  Location: [facility] OR;  Service: Orthopedics;  Laterality: Left;   KNEE ARTHROSCOPY Bilateral right 11/ 2007;  left 2012   LAPAROSCOPIC NISSEN FUNDOPLICATION  11-07-2000   dr gladis @[facility]    LUMBAR SPINE SURGERY  x4  last one 04-01-2008 dr joshua @[facility]    this including ORIF L4 compression fracture and fusion L3-5   NASAL SEPTOPLASTY W/ TURBINOPLASTY  2000   SHOULDER ARTHROSCOPY Left 07-20-2009   dr josefina @[facility]    ORIF of greater tuberosity and debridement   STRABISMUS SURGERY Left 09/11/2014   Procedure: REPAIR STRABISMUS LEFT EYE;  Surgeon: [name] Salt, MD;  Location: [facility];  Service: Ophthalmology;  Laterality: Left;  TONSILLECTOMY AND ADENOIDECTOMY  age 76   TOTAL KNEE ARTHROPLASTY Right 05/13/2007    dr [facility] @[facility]    TOTAL KNEE ARTHROPLASTY Left 08/05/2012   Procedure: TOTAL KNEE ARTHROPLASTY;  Surgeon: [name] DELENA Jane, MD;  Location: [facility] OR;  Service: Orthopedics;  Laterality: Left;   TRANSURETHRAL RESECTION OF PROSTATE N/A 03/05/2019   Procedure: TRANSURETHRAL RESECTION OF THE PROSTATE (TURP), BIPOLAR/ URETHRAL DILATION;  Surgeon: [name] Lonni Righter, MD;  Location: [facility];   Service: Urology;  Laterality: N/A;    Social History:  reports that he quit smoking about 40 years ago. His smoking use included cigarettes. He started smoking about 65 years ago. He quit smokeless tobacco use about 38 years ago.  His smokeless tobacco use included chew. He reports that he does not drink alcohol and does not use drugs.   No Known Allergies  Family History  Problem Relation Age of Onset   Heart attack Father    Stroke Maternal Grandfather    Cancer Other    Stroke Mother    COPD Sister    Other Brother        accident   Heart failure Sister    COPD Brother       Prior to Admission medications   Medication Sig Start Date End Date Taking? Authorizing Provider  acetaminophen  (TYLENOL ) 500 MG tablet Take 500 mg by mouth 2 (two) times daily as needed for moderate pain (pain score 4-6), fever or headache.    [provider]  aspirin  (BAYER ASPIRIN ) 325 MG tablet Take 1 tablet by mouth for 30 DAYS for blood clot prevention 10/09/23   [name], Jesse J, PA-C  HYDROcodone -acetaminophen  (NORCO/VICODIN) 5-325 MG tablet Take 1 tablet by mouth every 6 (six) hours as needed (for post op pain). 10/09/23   [name], Jesse J, PA-C  metFORMIN  (GLUCOPHAGE ) 500 MG tablet Take 500 mg by mouth with breakfast, with lunch, and with evening meal.    [provider]  mirabegron  ER (MYRBETRIQ ) 50 MG TB24 tablet Take 50 mg by mouth at bedtime.    [provider]  simvastatin  (ZOCOR ) 20 MG tablet Take 20 mg by mouth at bedtime.    [provider]    Physical Exam: BP 107/68 (BP Location: Right Arm)   Pulse (!) 107   Temp 97.6 F (36.4 C) (Rectal)   Resp 18   Ht 5' 10.98 (1.803 m)   Wt 92.2 kg   SpO2 96%   BMI 28.36 kg/m   General: 89 y.o. year-old male well developed well nourished in no acute distress.  Somnolent. Cardiovascular: Regular rate and rhythm with no rubs or gallops.  No thyromegaly or JVD noted.  No lower extremity edema. 2/4 pulses in all  4 extremities. Respiratory: Faint diffuse rales bilaterally.  Poor inspiratory effort. Abdomen: Soft nontender nondistended with normal bowel sounds x4 quadrants. Muskuloskeletal: No cyanosis, clubbing or edema noted bilaterally Neuro: CN II-XII intact, strength, sensation, reflexes Skin: No ulcerative lesions noted or rashes Psychiatry: Unable to assess judgment or mood due to his somnolence.         Labs on Admission:  Basic Metabolic Panel: Recent Labs  Lab 02/24/24 0321  NA 133*  K 5.5*  CL 98  CO2 18*  GLUCOSE 105*  BUN 43*  CREATININE 1.74*  CALCIUM 7.3*   Liver Function Tests: Recent Labs  Lab 02/24/24 0321  AST 40  ALT 56*  ALKPHOS 69  BILITOT 0.6  PROT 5.3*  ALBUMIN 2.1*  No results for input(s): LIPASE, AMYLASE in the last 168 hours. No results for input(s): AMMONIA in the last 168 hours. CBC: Recent Labs  Lab 02/24/24 0134  WBC 18.7*  NEUTROABS 15.9*  HGB 14.3  HCT 45.6  MCV 87.2  PLT 301   Cardiac Enzymes: No results for input(s): CKTOTAL, CKMB, CKMBINDEX, TROPONINI in the last 168 hours.  BNP (last 3 results) No results for input(s): BNP in the last 8760 hours.  ProBNP (last 3 results) No results for input(s): PROBNP in the last 8760 hours.  CBG: No results for input(s): GLUCAP in the last 168 hours.  Radiological Exams on Admission: DG Chest Port 1 View Result Date: 02/24/2024 EXAM: 1 VIEW XRAY OF THE CHEST 02/24/2024 01:05:22 AM COMPARISON: 10/06/2023 CLINICAL HISTORY: Questionable sepsis - evaluate for abnormality FINDINGS: LUNGS AND PLEURA: Patchy bilateral mid and lower lung airspace opacities. This could reflect edema or infection. No pleural effusion. No pneumothorax. HEART AND MEDIASTINUM: Mild cardiomegaly, vascular congestion. BONES AND SOFT TISSUES: No acute osseous abnormality. IMPRESSION: 1. Patchy bilateral mid and lower lung airspace opacities, possibly representing edema or infection. 2. Mild cardiomegaly  with vascular congestion. Electronically signed by: Franky Crease MD 02/24/2024 01:13 AM EDT RP Workstation: [id number]    EKG: I independently viewed the EKG done and my findings are as followed: Atrial fibrillation rate of 116.  Nonspecific ST-T changes.  QTc 427.  Assessment/Plan Present on Admission:  CAP (community acquired pneumonia)  Principal Problem:   CAP (community acquired pneumonia)  Multifocal pneumonia, POA Chest x-ray, personally reviewed showing bilateral pulmonary infiltrates suggestive of multifocal pneumonia. Continue Rocephin and azithromycin Bronchodilator nebulizers Early mobilization.  Lactic acidosis Lactic acid 2.9, downtrending after IV fluid 2.3. Continue IV fluid hydration Continue to trend lactic acid.  High anion gap metabolic acidosis in the setting of lactic acidosis Presented with serum bicarb 18 with anion gap of 17 Continue IV fluid hydration Repeat chemistry panel and lactic acid level.  Hyperkalemia Serum potassium 5.5 Received Lokelma 5 g x 1 Repeat chemistry panel.  Paroxysmal A-fib with RVR A-fib appears to be present on twelve-lead EKG done in June 2025 Not on oral anticoagulation likely due to history of subdural hematoma  Generalized weakness PT OT evaluation Fall precautions.  CKD 3 A Renal function is at baseline. Avoid nephrotoxic agents, dehydration, and hypotension.  Agitation Reorient as needed Fall precautions    Time: 75 minutes.    DVT prophylaxis: Subcu Lovenox  daily.    Code Status: DNR  Family Communication: None at bedside.  Disposition Plan: Admitted to telemetry unit.  Consults called: None.  Admission status: Inpatient status.   Status is: Inpatient The patient requires at least 2 midnights for further evaluation and treatment of present condition.   Terry LOISE Hurst MD [facility] Pager [phone]  If 7PM-7AM, please contact night-coverage [email or web address] Password  [email or web address]  02/24/2024, 5:07 AM

## 2024-02-24 NOTE — ED Notes (Signed)
 Pt put on 2L after the haldol admin, desatted to 87%. Pt still refusing IV but will allow cardiac monitoring at this moment, PA aware. Medications late as pt refusing IV to be established

## 2024-02-24 NOTE — ED Notes (Signed)
 Pt will not keep any cardiac monitoring on, reports I dont need this on. PA aware, haldol has been ordered however PA would like lokelma given prior to haldol. Awaiting lokelma from pharmacy

## 2024-02-24 NOTE — Progress Notes (Signed)
 PROGRESS NOTE  [name]  DOB: [birth year]  PCP: Avva, Ravisankar, MD FMW:[id number]  DOA: 02/24/2024  LOS: 0 days  Hospital Day: 1  Subjective: Patient was seen and examined this a.m. Elderly Caucasian male.  Propped up in bed.  Somnolent.  Opens eyes on command.  Able to confirm his name. Tender on abdomen. Family not at bedside. Chart reviewed.  This morning, remains in RVR with heart rate in the 110s, on 2 L oxygen  Labs this morning with WC count 18.9  Brief narrative: [name] is a 89 y.o. male with PMH significant for DM2, HTN, HLD, OSA on CPAP, CKD, TIA, traumatic subdural hemorrhage, cognitive impairment, BPH. Last night, patient presented to the ED with complaint of shortness of breath, lethargy, low urine output progressively worsening for the last 3 days.  Home health RN noted tachycardia to 150s and hence called EMS.  In the ED, patient was confused, agitated, A-fib with RVR with heart rate in 100s afebrile, breathing on room air Initial lab with WBC count 18.7, lactic acid 2.9 >2.3 BMP with sodium 133, potassium 5.5, BUN/creatinine 43/1.74 Urinalysis with clear yellow urine with negative leukocytes, negative nitrite, rare bacteria Blood culture sent Chest x-ray with patchy bilateral mid and lower lung airspace opacities -edema versus infection, mild cardiomegaly with vascular congestion  Patient was given IV Rocephin, azithromycin.  Also had to be given Haldol for agitation. O2 sat dropped down to 87% and hence started on 2 L Admitted to [facility]  Assessment and plan: Sepsis - POA Multifocal pneumonia, POA Patient with 3 days of shortness of breath, lethargy, Met sepsis criteria with tachycardia, leukocytosis, lactic acidosis Blood culture sent Started on IV Rocephin and azithromycin.  Since patient is also requiring Haldol, I will switch azithromycin to doxycycline to minimize effect on Qtc. Continue monitor for fever, trend On low-flow oxygen  at 2 L this  morning.  Wean down as tolerated Recent Labs  Lab 02/24/24 0134 02/24/24 0141 02/24/24 0426 02/24/24 0636 02/24/24 0648  WBC 18.7*  --   --  18.9*  --   LATICACIDVEN  --  2.9* 2.3*  --  2.7*   A-fib with RVR H/o paroxysmal A-fib PTA meds-not on any AV nodal blocking agent Not on anticoagulation due to history of subdural hematoma  Acute metabolic encephalopathy Was agitated in the ED and required a dose of Haldol Altered likely due to sepsis.  Given his age and presence of cerebrovascular risk factors, may have underlying propensity to vascular dementia.  Per ED notes, home health nurse mention history of cognitive impairment Mental status fluctuating.  Only oriented to place on my eval this morning.  Was agitated earlier. Continue Seroquel 25 mg nightly and Depakote 125 mg 3 times daily  AKI on CKD 2 Acute metabolic acidosis Creatinine at baseline 1.13 from June 2025.  Patient with creatinine elevated to 1.74, secondary to sepsis.  Bicarb low at 18 probably due to lactic acidosis as well. Monitor with IV hydration.  Currently on LR 75 mL/h  Recent Labs    10/06/23 1525 10/07/23 0607 10/07/23 1123 10/08/23 0518 10/09/23 0550 10/10/23 0521 02/24/24 0321  BUN 28*  --  26* 28* 26* 18 43*  CREATININE 1.43* 1.26* 1.24 1.37* 1.16 1.13 1.74*  CO2 22  --  22 24 23 26  18*   Hyperkalemia Potassium elevated to 5.5 in the ED.  Lokelma 1 dose was given. Recent Labs  Lab 02/24/24 0321  K 5.5*   Type 2 diabetes mellitus  A1c 8.2 in June 2025 PTA meds-metformin  Started SSI/Accu-Cheks Lab Results  Component Value Date   HGBA1C 8.2 (H) 10/07/2023   No results for input(s): GLUCAP in the last 168 hours.  H/o TIA, Hyperlipidemia H/o traumatic SDH PTA meds- aspirin  325 mg p.o., Zocor  20 mg nightly Continue Zocor .  Generalized weakness PT OT evaluation Fall precautions.  Mobility: Pending PT eval  PT Orders: Active   PT Follow up Rec: Skilled Nursing-Short Term Rehab  (<3 Hours/Day)02/24/2024 1100    Goals of care   Code Status: Limited: Do not attempt resuscitation (DNR) -DNR-LIMITED -Do Not Intubate/DNI .  I confirmed this with patient's son this afternoon    DVT prophylaxis:  enoxaparin  (LOVENOX ) injection 40 mg Start: 02/24/24 1000   Antimicrobials: IV Rocephin Fluid: LR 75 mL/h Consultants: None Family Communication: Discussed with patient's son [name] on the phone.  Status: Inpatient Level of care:  Telemetry   Patient is from: Home [facility] to continue in-hospital care: Ongoing workup and management Anticipated d/c to: Pending clinical course    Diet:  Diet Order             Diet Heart Room service appropriate? Yes; Fluid consistency: Thin  Diet effective now                   Scheduled Meds:  doxycycline  100 mg Oral Q12H   enoxaparin  (LOVENOX ) injection  40 mg Subcutaneous Q24H   insulin  aspart  0-5 Units Subcutaneous QHS   insulin  aspart  0-9 Units Subcutaneous TID WC   ipratropium-albuterol  3 mL Nebulization Q6H   QUEtiapine  25 mg Oral QHS   simvastatin   20 mg Oral QHS    PRN meds: acetaminophen , guaiFENesin-dextromethorphan, ipratropium-albuterol, melatonin, polyethylene glycol, prochlorperazine   Infusions:   cefTRIAXone (ROCEPHIN)  IV     lactated ringers  75 mL/hr at 02/24/24 0646   sodium chloride       Antimicrobials: Anti-infectives (From admission, onward)    Start     Dose/Rate Route Frequency Ordered Stop   02/24/24 2200  cefTRIAXone (ROCEPHIN) 2 g in sodium chloride  0.9 % 100 mL IVPB        2 g 200 mL/hr over 30 Minutes Intravenous Every 24 hours 02/24/24 0505     02/24/24 1800  azithromycin (ZITHROMAX) 500 mg in sodium chloride  0.9 % 250 mL IVPB  Status:  Discontinued        500 mg 250 mL/hr over 60 Minutes Intravenous Every 24 hours 02/24/24 0505 02/24/24 0753   02/24/24 1000  doxycycline (VIBRA-TABS) tablet 100 mg        100 mg Oral Every 12 hours 02/24/24 0753     02/24/24 0500   azithromycin (ZITHROMAX) 500 mg in sodium chloride  0.9 % 250 mL IVPB        500 mg 250 mL/hr over 60 Minutes Intravenous  Once 02/24/24 0445 02/24/24 0758   02/24/24 0200  cefTRIAXone (ROCEPHIN) 1 g in sodium chloride  0.9 % 100 mL IVPB        1 g 200 mL/hr over 30 Minutes Intravenous  Once 02/24/24 0154 02/24/24 0218       Objective: Vitals:   02/24/24 1400 02/24/24 1420  BP: (!) 117/106 (!) 89/57  Pulse: (!) 102 (!) 102  Resp:    Temp:    SpO2: 96% 96%   No intake or output data in the 24 hours ending 02/24/24 1457 Filed Weights   02/24/24 0042  Weight: 92.2 kg  Weight change:  Body mass index is 28.36 kg/m.   Physical Exam: General exam: Pleasant, elderly Caucasian male.  Not in distress at the time of my eval Skin: No rashes, lesions or ulcers. HEENT: Atraumatic, normocephalic, no obvious bleeding Lungs: Clear to auscultation bilaterally,  CVS: S1, S2, no murmur,   GI/Abd: Soft, nontender, nondistended, bowel sound present,   CNS: Somnolent, opens eyes to command.  Oriented to place.  Not agitated at the time of my eval Psychiatry: Sad affect Extremities: No pedal edema, no calf tenderness,   Data Review: I have personally reviewed the laboratory data and studies available.  F/u labs ordered Unresulted Labs (From admission, onward)     Start     Ordered   03/02/24 0500  Creatinine, serum  (enoxaparin  (LOVENOX )    CrCl >/= 30 ml/min)  Weekly,   R     Comments: while on enoxaparin  therapy    02/24/24 0502   02/25/24 0500  Comprehensive metabolic panel  Tomorrow morning,   R        02/24/24 0652   02/25/24 0500  CBC  Tomorrow morning,   R        02/24/24 0652   02/25/24 0500  Magnesium  Tomorrow morning,   R        02/24/24 0652   02/25/24 0500  Phosphorus  Tomorrow morning,   R        02/24/24 0652   02/24/24 0648  Lactic acid, plasma  (Lactic Acid)  STAT Now then every 3 hours,   R      02/24/24 0647   02/24/24 0508  Brain natriuretic peptide  Add-on,    AD        02/24/24 0507            Signed, Chapman Rota, MD [facility] 02/24/2024

## 2024-02-25 DIAGNOSIS — J189 Pneumonia, unspecified organism: Secondary | ICD-10-CM | POA: Diagnosis not present

## 2024-02-25 LAB — COMPREHENSIVE METABOLIC PANEL WITH GFR
ALT: 43 U/L (ref 0–44)
AST: 26 U/L (ref 15–41)
Albumin: 2 g/dL — ABNORMAL LOW (ref 3.5–5.0)
Alkaline Phosphatase: 65 U/L (ref 38–126)
Anion gap: 14 (ref 5–15)
BUN: 36 mg/dL — ABNORMAL HIGH (ref 8–23)
CO2: 20 mmol/L — ABNORMAL LOW (ref 22–32)
Calcium: 7.6 mg/dL — ABNORMAL LOW (ref 8.9–10.3)
Chloride: 101 mmol/L (ref 98–111)
Creatinine, Ser: 1.43 mg/dL — ABNORMAL HIGH (ref 0.61–1.24)
GFR, Estimated: 47 mL/min — ABNORMAL LOW (ref >60)
Glucose, Bld: 109 mg/dL — ABNORMAL HIGH (ref 70–99)
Potassium: 4.6 mmol/L (ref 3.5–5.1)
Sodium: 135 mmol/L (ref 135–145)
Total Bilirubin: 0.7 mg/dL (ref 0.0–1.2)
Total Protein: 5.2 g/dL — ABNORMAL LOW (ref 6.5–8.1)

## 2024-02-25 LAB — GLUCOSE, CAPILLARY
Glucose-Capillary: 116 mg/dL — ABNORMAL HIGH (ref 70–99)
Glucose-Capillary: 126 mg/dL — ABNORMAL HIGH (ref 70–99)
Glucose-Capillary: 160 mg/dL — ABNORMAL HIGH (ref 70–99)
Glucose-Capillary: 149 mg/dL — ABNORMAL HIGH (ref 70–99)

## 2024-02-25 LAB — CBC
HCT: 35.6 % — ABNORMAL LOW (ref 39.0–52.0)
Hemoglobin: 11.3 g/dL — ABNORMAL LOW (ref 13.0–17.0)
MCH: 27.1 pg (ref 26.0–34.0)
MCHC: 31.7 g/dL (ref 30.0–36.0)
MCV: 85.4 fL (ref 80.0–100.0)
Platelets: 290 K/uL (ref 150–400)
RBC: 4.17 MIL/uL — ABNORMAL LOW (ref 4.22–5.81)
RDW: 16.2 % — ABNORMAL HIGH (ref 11.5–15.5)
WBC: 13.2 K/uL — ABNORMAL HIGH (ref 4.0–10.5)
nRBC: 0.4 % — ABNORMAL HIGH (ref 0.0–0.2)

## 2024-02-25 LAB — PHOSPHORUS: Phosphorus: 3.9 mg/dL (ref 2.5–4.6)

## 2024-02-25 LAB — MAGNESIUM: Magnesium: 1.5 mg/dL — ABNORMAL LOW (ref 1.7–2.4)

## 2024-02-25 MED ORDER — MAGNESIUM SULFATE 2 GM/50ML IV SOLN
2.0000 g | Freq: Once | INTRAVENOUS | Status: AC
  Administered 2024-02-25: 2 g via INTRAVENOUS
  Filled 2024-02-25: qty 50

## 2024-02-25 MED ORDER — METOPROLOL TARTRATE 12.5 MG HALF TABLET
12.5000 mg | ORAL_TABLET | Freq: Two times a day (BID) | ORAL | Status: DC
  Administered 2024-02-25 – 2024-02-26 (×3): 12.5 mg via ORAL
  Filled 2024-02-25 (×3): qty 1

## 2024-02-25 NOTE — NC FL2 (Signed)
 [place]  MEDICAID FL2 LEVEL OF CARE FORM     IDENTIFICATION  Patient Name: [name] Birthdate: October 31, 1931 Sex: male Admission Date (Current Location): 02/24/2024  [facility] [place] Number:  Producer, Television/film/video and Address:  [facility] [place], [address], [place], [place] [address]      Provider Number: [id number]  Attending Physician Name and Address:  Arlice Reichert, MD  Relative Name and Phone Number:  Joseangel Nettleton (son) [phone]    Current Level of Care: Hospital Recommended Level of Care: Skilled Nursing Facility Prior Approval Number:    Date Approved/Denied:   PASRR Number: [id number]  Discharge Plan: SNF    Current Diagnoses: Patient Active Problem List   Diagnosis Date Noted   CAP (community acquired pneumonia) 02/24/2024   Fall 10/06/2023   Closed 2-part intertrochanteric fracture of left femur (HCC) 10/06/2023   Rhabdomyolysis 10/06/2023   AKI (acute kidney injury) 10/06/2023   BPH with urinary obstruction 03/05/2019   Excessive daytime sleepiness 01/26/2016   OSA (obstructive sleep apnea) 01/26/2016   Snoring 12/29/2015   Other cardiac arrhythmia 12/29/2015   Diabetes mellitus (HCC)    Hypertension    Hypercholesteremia    Left knee DJD    S/P total knee arthroplasty 05/13/2007    Orientation RESPIRATION BLADDER Height & Weight     Self  O2 (Nasal cannula 2 liters) Incontinent, External catheter (External Urinary Catheter) Weight: 203 lb 4.2 oz (92.2 kg) Height:  5' 10.98 (180.3 cm)  BEHAVIORAL SYMPTOMS/MOOD NEUROLOGICAL BOWEL NUTRITION STATUS        Diet (Please see discharge summary)  AMBULATORY STATUS COMMUNICATION OF NEEDS Skin   Extensive Assist Verbally Other (Comment) (Ecchymosis,arm,leg,Bil.,Erythema,toe,Bil.,Wound/Incision LDAs)                       Personal Care Assistance Level of Assistance  Bathing, Feeding, Dressing Bathing Assistance: Maximum assistance Feeding assistance: Limited  assistance Dressing Assistance: Maximum assistance     Functional Limitations Info  Hearing, Speech   Hearing Info: Adequate Speech Info: Adequate    SPECIAL CARE FACTORS FREQUENCY  PT (By licensed PT), OT (By licensed OT)     PT Frequency: 5x min weekly OT Frequency: 5x min weekly            Contractures Contractures Info: Not present    Additional Factors Info  Code Status, Allergies, Insulin  Sliding Scale, Psychotropic Code Status Info: DNR Allergies Info: NKA Psychotropic Info: QUEtiapine (SEROQUEL) tablet 25 mg daily at bedtime Insulin  Sliding Scale Info: insulin  aspart (novoLOG ) injection 0-5 Units daily at bedtime,insulin  aspart (novoLOG ) injection 0-9 Units 3 times daily with meals       Current Medications (02/25/2024):  This is the current hospital active medication list Current Facility-Administered Medications  Medication Dose Route Frequency Provider Last Rate Last Admin   acetaminophen  (TYLENOL ) tablet 500 mg  500 mg Oral Q6H PRN Hall, Carole N, DO   500 mg at 02/24/24 2255   cefTRIAXone (ROCEPHIN) 2 g in sodium chloride  0.9 % 100 mL IVPB  2 g Intravenous Q24H Shona Laurence N, DO 200 mL/hr at 02/24/24 2309 2 g at 02/24/24 2309   doxycycline (VIBRA-TABS) tablet 100 mg  100 mg Oral Q12H Dahal, Reichert, MD   100 mg at 02/25/24 0902   enoxaparin  (LOVENOX ) injection 40 mg  40 mg Subcutaneous Q24H Shona Laurence N, DO   40 mg at 02/25/24 0901   guaiFENesin-dextromethorphan (ROBITUSSIN DM) 100-10 MG/5ML syrup 5 mL  5 mL Oral Q4H PRN Hall, Carole N, DO       insulin  aspart (novoLOG ) injection 0-5 Units  0-5 Units Subcutaneous QHS Dahal, Binaya, MD       insulin  aspart (novoLOG ) injection 0-9 Units  0-9 Units Subcutaneous TID WC Dahal, Binaya, MD   1 Units at 02/25/24 1253   ipratropium-albuterol (DUONEB) 0.5-2.5 (3) MG/3ML nebulizer solution 3 mL  3 mL Nebulization Q2H PRN Shona Terry SAILOR, DO       lactated ringers  infusion   Intravenous Continuous Shona Terry SAILOR, DO 75  mL/hr at 02/25/24 9097 Infusion Verify at 02/25/24 0902   melatonin tablet 5 mg  5 mg Oral QHS PRN Shona Terry N, DO       metoprolol tartrate (LOPRESSOR) tablet 12.5 mg  12.5 mg Oral BID Dahal, Binaya, MD   12.5 mg at 02/25/24 1034   polyethylene glycol (MIRALAX / GLYCOLAX) packet 17 g  17 g Oral Daily PRN Shona Terry N, DO       prochlorperazine (COMPAZINE) injection 5 mg  5 mg Intravenous Q6H PRN Hall, Carole N, DO       QUEtiapine (SEROQUEL) tablet 25 mg  25 mg Oral QHS Dahal, Binaya, MD   25 mg at 02/24/24 2255   simvastatin  (ZOCOR ) tablet 20 mg  20 mg Oral QHS Dahal, Binaya, MD   20 mg at 02/24/24 2255     Discharge Medications: Please see discharge summary for a list of discharge medications.  Relevant Imaging Results:  Relevant Lab Results:   Additional Information [id number]-[id number]  Isaiah Public, LCSWA

## 2024-02-25 NOTE — TOC Initial Note (Signed)
 Transition of Care [facility]) - Initial/Assessment Note    Patient Details  Name: [name] MRN: [id number] Date of Birth: 1931-09-18  Transition of Care [facility]) CM/SW Contact:    Isaiah Public, LCSWA Phone Number: 02/25/2024, 2:31 PM  Clinical Narrative:                  CSW received consult for possible SNF placement at time of discharge. Due to patients current orientation  CSW spoke with patients son [name] regarding PT recommendation of SNF placement for patient at time of discharge. Patients son  expressed understanding of PT recommendation and is agreeable to SNF placement for patient at time of discharge. Patients son gave CSW permission to fax out initial referral for SNF placement. CSW discussed insurance authorization process. All questions answered. No further questions reported at this time. CSW to continue to follow and assist with discharge planning needs.   Expected Discharge Plan: Skilled Nursing Facility Barriers to Discharge: Continued Medical Work up   Patient Goals and CMS Choice Patient states their goals for this hospitalization and ongoing recovery are:: SNF CMS Medicare.gov Compare Post Acute Care list provided to:: Patient Choice offered to / list presented to : Patient      Expected Discharge Plan and Services In-house Referral: Clinical Social Work     Living arrangements for the past 2 months: Single Family Home                                      Prior Living Arrangements/Services Living arrangements for the past 2 months: Single Family Home   Patient language and need for interpreter reviewed:: Yes        Need for Family Participation in Patient Care: Yes (Comment) Care giver support system in place?: Yes (comment)   Criminal Activity/Legal Involvement Pertinent to Current Situation/Hospitalization: No - Comment as needed  Activities of Daily Living   ADL Screening (condition at time of admission) Does the patient have difficulty  concentrating, remembering, or making decisions?: Yes  Permission Sought/Granted Permission sought to share information with : Case Manager, Magazine Features Editor, Family Supports                Emotional Assessment       Orientation: : Oriented to Self Alcohol / Substance Use: Not Applicable Psych Involvement: No (comment)  Admission diagnosis:  Hyperkalemia [E87.5] CAP (community acquired pneumonia) [J18.9] AKI (acute kidney injury) [N17.9] Atrial fibrillation with RVR (HCC) [I48.91] Pneumonia due to infectious organism, unspecified laterality, unspecified part of lung [J18.9] Sepsis with acute organ dysfunction without septic shock, due to unspecified organism, unspecified organ dysfunction type (HCC) [A41.9, R65.20] Patient Active Problem List   Diagnosis Date Noted   CAP (community acquired pneumonia) 02/24/2024   Fall 10/06/2023   Closed 2-part intertrochanteric fracture of left femur (HCC) 10/06/2023   Rhabdomyolysis 10/06/2023   AKI (acute kidney injury) 10/06/2023   BPH with urinary obstruction 03/05/2019   Excessive daytime sleepiness 01/26/2016   OSA (obstructive sleep apnea) 01/26/2016   Snoring 12/29/2015   Other cardiac arrhythmia 12/29/2015   Diabetes mellitus (HCC)    Hypertension    Hypercholesteremia    Left knee DJD    S/P total knee arthroplasty 05/13/2007   PCP:  Janey Santos, MD Pharmacy:  No Pharmacies Listed    Social Drivers of Health (SDOH) Social History: SDOH Screenings   Food Insecurity: No Food Insecurity (10/08/2023)  Housing: Low Risk  (10/08/2023)  Transportation Needs: No Transportation Needs (10/08/2023)  Utilities: Not At Risk (10/08/2023)  Social Connections: Moderately Integrated (10/08/2023)  Tobacco Use: Medium Risk (02/24/2024)   SDOH Interventions:     Readmission Risk Interventions     No data to display

## 2024-02-25 NOTE — Progress Notes (Signed)
 PROGRESS NOTE  [name]  DOB: [birth year]  PCP: Avva, Ravisankar, MD FMW:[id number]  DOA: 02/24/2024  LOS: 1 day  Hospital Day: 2  Subjective: Patient was seen and examined this a.m. Propped up in bed.  Not in distress.  Alert, awake.  Oriented to place. Hard of hearing Family not at bedside. Afebrile, tachycardic to 110s this morning, blood pressure in 90s and 100s, breathing on 2 L Labs this morning with WBC count 13.2, creatinine 1.43, magnesium 1.5  Brief narrative: [name] is a 89 y.o. male with PMH significant for DM2, HTN, HLD, OSA on CPAP, CKD, TIA, traumatic subdural hemorrhage, cognitive impairment, BPH. Last night, patient presented to the ED with complaint of shortness of breath, lethargy, low urine output progressively worsening for the last 3 days.  Home health RN noted tachycardia to 150s and hence called EMS.  In the ED, patient was confused, agitated, A-fib with RVR with heart rate in 100s afebrile, breathing on room air Initial lab with WBC count 18.7, lactic acid 2.9 >2.3 BMP with sodium 133, potassium 5.5, BUN/creatinine 43/1.74 Urinalysis with clear yellow urine with negative leukocytes, negative nitrite, rare bacteria Blood culture sent Chest x-ray with patchy bilateral mid and lower lung airspace opacities -edema versus infection, mild cardiomegaly with vascular congestion  Patient was given IV Rocephin, azithromycin.  Also had to be given Haldol for agitation. O2 sat dropped down to 87% and hence started on 2 L Admitted to [facility]  Assessment and plan: Sepsis - POA Multifocal pneumonia, POA Patient with 3 days of shortness of breath, lethargy, Met sepsis criteria with tachycardia, leukocytosis, lactic acidosis Blood culture sent.  No growth so far Currently remains on IV Rocephin and doxycycline No fever.  WBC count improving.  Lactic acid level improving Repeat labs tomorrow On low-flow oxygen  at 2 L this morning.  Wean down as tolerated Recent  Labs  Lab 02/24/24 0134 02/24/24 0141 02/24/24 0426 02/24/24 0636 02/24/24 0648 02/24/24 1912 02/25/24 0511  WBC 18.7*  --   --  18.9*  --   --  13.2*  LATICACIDVEN  --  2.9* 2.3*  --  2.7* 2.4*  --    A-fib with RVR H/o paroxysmal A-fib PTA meds-not on any AV nodal blocking agent Because of persistently elevated heart rate over 100 bpm, I have started him on metoprolol tartrate 12.5 mg twice daily Not on anticoagulation due to history of subdural hematoma  Acute metabolic encephalopathy Was agitated in the ED and required a dose of Haldol Altered likely due to sepsis.  Given his age and presence of cerebrovascular risk factors, may have underlying propensity to vascular dementia.  Per ED notes, home health nurse mention history of cognitive impairment Mental status gradually improved.  Slow to respond but oriented to place and person.  Currently close to his baseline now. Continue Seroquel 25 mg nightly and Depakote 125 mg 3 times daily  AKI on CKD 2 Acute metabolic acidosis Creatinine at baseline 1.13 from June 2025.  Patient with creatinine elevated to 1.74, secondary to sepsis.  Bicarb low at 18 probably due to lactic acidosis as well. Monitor with IV hydration.  Currently on LR 75 mL/h  Recent Labs    10/06/23 1525 10/07/23 0607 10/07/23 1123 10/08/23 0518 10/09/23 0550 10/10/23 0521 02/24/24 0321 02/25/24 0511  BUN 28*  --  26* 28* 26* 18 43* 36*  CREATININE 1.43* 1.26* 1.24 1.37* 1.16 1.13 1.74* 1.43*  CO2 22  --  22 24 23  26 18* 20*   Hyperkalemia Potassium was initially elevated to 5.5 in the ED.  Lokelma 1 dose was given.  Potassium level improved to 4.6 today Recent Labs  Lab 02/24/24 0321 02/25/24 0511  K 5.5* 4.6  MG  --  1.5*  PHOS  --  3.9   Type 2 diabetes mellitus A1c 8.2 in June 2025 PTA meds-metformin  Started SSI/Accu-Cheks Recent Labs  Lab 02/24/24 1728 02/24/24 2033 02/25/24 0811 02/25/24 1157  GLUCAP 92 94 116* 126*    H/o TIA,  Hyperlipidemia H/o traumatic SDH PTA meds- aspirin  325 mg p.o., Zocor  20 mg nightly Continue Zocor .  Generalized weakness PT OT evaluation Fall precautions.  Mobility: PT recommended SNF  PT Orders: Active   PT Follow up Rec: Skilled Nursing-Short Term Rehab (<3 Hours/Day)02/24/2024 1100    Goals of care   Code Status: Limited: Do not attempt resuscitation (DNR) -DNR-LIMITED -Do Not Intubate/DNI .  I confirmed this with patient's son this afternoon    DVT prophylaxis:  enoxaparin  (LOVENOX ) injection 40 mg Start: 02/24/24 1000   Antimicrobials: IV Rocephin Fluid: LR 75 mL/h to continue for today Consultants: None Family Communication: Discussed with patient's son Mr. Knoxx Boeding on the phone yesterday.  Status: Inpatient Level of care:  Telemetry   Patient is from: Home Needs to continue in-hospital care: Ongoing workup and management Anticipated d/c to: Pending clinical course, pending PT eval    Diet:  Diet Order             Diet Heart Room service appropriate? Yes; Fluid consistency: Thin  Diet effective now                   Scheduled Meds:  doxycycline  100 mg Oral Q12H   enoxaparin  (LOVENOX ) injection  40 mg Subcutaneous Q24H   insulin  aspart  0-5 Units Subcutaneous QHS   insulin  aspart  0-9 Units Subcutaneous TID WC   metoprolol tartrate  12.5 mg Oral BID   QUEtiapine  25 mg Oral QHS   simvastatin   20 mg Oral QHS    PRN meds: acetaminophen , guaiFENesin-dextromethorphan, ipratropium-albuterol, melatonin, polyethylene glycol, prochlorperazine   Infusions:   cefTRIAXone (ROCEPHIN)  IV 2 g (02/24/24 2309)   lactated ringers  75 mL/hr at 02/25/24 0902    Antimicrobials: Anti-infectives (From admission, onward)    Start     Dose/Rate Route Frequency Ordered Stop   02/24/24 2200  cefTRIAXone (ROCEPHIN) 2 g in sodium chloride  0.9 % 100 mL IVPB        2 g 200 mL/hr over 30 Minutes Intravenous Every 24 hours 02/24/24 0505     02/24/24 1800   azithromycin (ZITHROMAX) 500 mg in sodium chloride  0.9 % 250 mL IVPB  Status:  Discontinued        500 mg 250 mL/hr over 60 Minutes Intravenous Every 24 hours 02/24/24 0505 02/24/24 0753   02/24/24 1000  doxycycline (VIBRA-TABS) tablet 100 mg        100 mg Oral Every 12 hours 02/24/24 0753     02/24/24 0500  azithromycin (ZITHROMAX) 500 mg in sodium chloride  0.9 % 250 mL IVPB        500 mg 250 mL/hr over 60 Minutes Intravenous  Once 02/24/24 0445 02/24/24 0758   02/24/24 0200  cefTRIAXone (ROCEPHIN) 1 g in sodium chloride  0.9 % 100 mL IVPB        1 g 200 mL/hr over 30 Minutes Intravenous  Once 02/24/24 0154 02/24/24 0218  Objective: Vitals:   02/25/24 1034 02/25/24 1102  BP: (!) 98/59 (!) 128/99  Pulse: (!) 126 (!) 121  Resp:  18  Temp:  (!) 97.5 F (36.4 C)  SpO2:  96%    Intake/Output Summary (Last 24 hours) at 02/25/2024 1410 Last data filed at 02/25/2024 0902 Gross per 24 hour  Intake 2124.76 ml  Output 600 ml  Net 1524.76 ml   Filed Weights   02/24/24 0042  Weight: 92.2 kg   Weight change:  Body mass index is 28.36 kg/m.   Physical Exam: General exam: Pleasant, elderly Caucasian male.  Not in distress at the time of my eval. Skin: No rashes, lesions or ulcers. HEENT: Atraumatic, normocephalic, no obvious bleeding Lungs: Clear to auscultation bilaterally,  CVS: S1, S2, no murmur,   GI/Abd: Soft, nontender, nondistended, bowel sound present,   CNS: Alert, awake, oriented to place and person.  Hard of hearing.  Not agitated at the time of my eval Psychiatry: Mood appropriate Extremities: No pedal edema, no calf tenderness,   Data Review: I have personally reviewed the laboratory data and studies available.  F/u labs ordered Unresulted Labs (From admission, onward)     Start     Ordered   03/02/24 0500  Creatinine, serum  (enoxaparin  (LOVENOX )    CrCl >/= 30 ml/min)  Weekly,   R     Comments: while on enoxaparin  therapy    02/24/24 0502   02/26/24  0500  CBC with Differential/Platelet  Tomorrow morning,   R       Question:  Specimen collection method  Answer:  Lab=Lab collect   02/25/24 0953   02/26/24 0500  Basic metabolic panel with GFR  Tomorrow morning,   R       Question:  Specimen collection method  Answer:  Lab=Lab collect   02/25/24 0953   02/26/24 0500  Lactic acid, plasma  (Lactic Acid)  Tomorrow morning,   R       Question:  Specimen collection method  Answer:  Lab=Lab collect   02/25/24 0953            Signed, Chapman Rota, MD [facility] 02/25/2024

## 2024-02-25 NOTE — Evaluation (Addendum)
 Occupational Therapy Evaluation Patient Details Name: [name] MRN: [id number] DOB: 20-Apr-1932 Today's Date: 02/25/2024   History of Present Illness   Pt is 89 year old presented to [facility] on  02/24/24 for SOB, lethargy, and tachycardia. Pt with PNA. Became agitated in ED. PMH - lt hip fx, arthritis, cardiac arrythmia, HTN, R side sciatica, DMII, bil knee surgeries, L shoulder surgery.     Clinical Impressions Pt presents with decline in function and safety with ADLs and ADL mobility with impaired strength, balance, endurance and cognition; no family/caregiver present to provide baseline cognition and PLOF info. Pt disoriented to time, place and situation. Pt pleasantly confused and reports he was Ind with ADLs/selfcare, mobility and lives alone. Pt currently requires min A to sit EOB with increased time and cuing, CGA for grooming/hygiene tasks, min A with UB ADLs, max-total A with LB ADLs, total A with toileitng (bed level). At rest pt's HR upper 90s/low 100s, 2 SATs 92-94%. Unable to attempt, STS and transfers pt HR increasing to low 140s and O2 SATs decreasing to low 80s seated at EOB for during ADLs tasks; pt c/o feeling SOB. Pt returned to supine in bed, instructed on deep breathing with HR back to upper 90s-low 100s and O2 SATs 88-92%. RN notified. OT will follow acutely to maximize level of function and safety    If plan is discharge home, recommend the following:   A lot of help with bathing/dressing/bathroom;Two people to help with walking and/or transfers;Direct supervision/assist for financial management;Assistance with cooking/housework;Assistance with feeding;Direct supervision/assist for medications management;Help with stairs or ramp for entrance     Functional Status Assessment   Patient has had a recent decline in their functional status and demonstrates the ability to make significant improvements in function in a reasonable and predictable amount of time.     Equipment  Recommendations   Other (comment) (defer)     Recommendations for Other Services         Precautions/Restrictions   Precautions Precautions: Fall;Other (comment) Precaution/Restrictions Comments: watch HR and O2 Restrictions Weight Bearing Restrictions Per Provider Order: No     Mobility Bed Mobility Overal bed mobility: Needs Assistance Bed Mobility: Supine to Sit, Sit to Supine     Supine to sit: Mod assist, HOB elevated Sit to supine: Mod assist   General bed mobility comments: assist and increased time required due to poor cognition and weakness    Transfers                   General transfer comment: unable to attempt, pt HR increasing to low 140s and O2 SATs decreasing to low 80s seated at EOB for ADL tasks      Balance Overall balance assessment: Needs assistance Sitting-balance support: No upper extremity supported, Feet supported Sitting balance-Leahy Scale: Fair   Postural control: Posterior lean Standing balance support: Bilateral upper extremity supported, During functional activity, Reliant on assistive device for balance Standing balance-Leahy Scale: Poor                             ADL either performed or assessed with clinical judgement   ADL Overall ADL's : Needs assistance/impaired Eating/Feeding: Set up;Supervision/ safety;Sitting   Grooming: Wash/dry hands;Wash/dry face;Contact guard assist;Sitting   Upper Body Bathing: Minimal assistance;Sitting   Lower Body Bathing: Maximal assistance   Upper Body Dressing : Minimal assistance;Sitting   Lower Body Dressing: Maximal assistance     Toilet Transfer  Details (indicate cue type and reason): unable to attempt, pt HR increasing to low 140s and O2 SATs decreasing to low 80s seated at EOB for ADL tasks Toileting- Clothing Manipulation and Hygiene: Total assistance;Bed level         General ADL Comments: pt c/o feeling SOB, session limited as HR increasing and O2  decreasing during sitting EOB ADL tasks     Vision Baseline Vision/History: 1 Wears glasses Ability to See in Adequate Light: 0 Adequate Patient Visual Report: No change from baseline       Perception         Praxis         Pertinent Vitals/Pain Pain Assessment Pain Assessment: No/denies pain Faces Pain Scale: No hurt     Extremity/Trunk Assessment Upper Extremity Assessment Upper Extremity Assessment: Generalized weakness;Right hand dominant   Lower Extremity Assessment Lower Extremity Assessment: Defer to PT evaluation       Communication Communication Communication: Impaired Factors Affecting Communication: Hearing impaired   Cognition Arousal: Alert Behavior During Therapy: WFL for tasks assessed/performed Cognition: No family/caregiver present to determine baseline             OT - Cognition Comments: pt confused stating that he lives across the road, has 12 kids then says he has 2 kids, unaware that he is in hospital                 Following commands: Impaired Following commands impaired: Follows one step commands inconsistently, Follows one step commands with increased time     Cueing  General Comments   Cueing Techniques: Verbal cues;Tactile cues;Visual cues      Exercises     Shoulder Instructions      Home Living Family/patient expects to be discharged to:: Private residence Living Arrangements: Alone Available Help at Discharge: Family;Available PRN/intermittently Type of Home: House Home Access: Stairs to enter [employer] of Steps: 2 Entrance Stairs-Rails: Right Home Layout: Two level;Able to live on main level with bedroom/bathroom Alternate Level Stairs-Number of Steps: flight Alternate Level Stairs-Rails: Right Bathroom Shower/Tub: Tub/shower unit;Walk-in shower   Bathroom Toilet: Handicapped height Bathroom Accessibility: Yes   Home Equipment: Agricultural Consultant (2 wheels);Cane - single point;Crutches;Grab  bars - tub/shower;BSC/3in1;Shower seat          Prior Functioning/Environment Prior Level of Function : Independent/Modified Independent             Mobility Comments: uses RW per PT note ADLs Comments: pt repors that he was Ind with ADLs/selfcare; uncertain of accuracy of info as pt confused and disoriented to place, time and situation    OT Problem List: Decreased strength;Decreased knowledge of precautions;Decreased activity tolerance;Decreased cognition;Decreased safety awareness;Impaired balance (sitting and/or standing)   OT Treatment/Interventions: Self-care/ADL training;Therapeutic exercise;Balance training;Therapeutic activities;DME and/or AE instruction;Patient/family education      OT Goals(Current goals can be found in the care plan section)   Acute Rehab OT Goals Patient Stated Goal: none stated OT Goal Formulation: With patient Time For Goal Achievement: 03/10/24 Potential to Achieve Goals: Good ADL Goals Pt Will Perform Grooming: with supervision;with set-up;sitting Pt Will Perform Upper Body Bathing: with contact guard assist;with supervision;sitting Pt Will Perform Lower Body Bathing: with mod assist;sitting/lateral leans Pt Will Perform Upper Body Dressing: with contact guard assist;with supervision;sitting Pt Will Transfer to Toilet: with mod assist;with min assist;stand pivot transfer;bedside commode   OT Frequency:  Min 2X/week    Co-evaluation              AM-PAC OT  6 Clicks Daily Activity     Outcome Measure Help from another person eating meals?: A Little Help from another person taking care of personal grooming?: A Little Help from another person toileting, which includes using toliet, bedpan, or urinal?: Total Help from another person bathing (including washing, rinsing, drying)?: A Lot Help from another person to put on and taking off regular upper body clothing?: A Little Help from another person to put on and taking off regular  lower body clothing?: Total 6 Click Score: 13   End of Session Nurse Communication: Mobility status;Other (comment) (HR and O2 SATs)  Activity Tolerance: Patient limited by fatigue;Other (comment) (SOB, increasing HR with minimal exertion) Patient left:    OT Visit Diagnosis: Other abnormalities of gait and mobility (R26.89);Muscle weakness (generalized) (M62.81)                Time: 8940-8868 OT Time Calculation (min): 32 min Charges:  OT General Charges $OT Visit: 1 Visit OT Evaluation $OT Eval Moderate Complexity: 1 Mod OT Treatments $Therapeutic Activity: 8-22 mins    Jacques Karna Loose 02/25/2024, 1:03 PM

## 2024-02-26 DIAGNOSIS — J189 Pneumonia, unspecified organism: Secondary | ICD-10-CM | POA: Diagnosis not present

## 2024-02-26 DIAGNOSIS — Z66 Do not resuscitate: Secondary | ICD-10-CM

## 2024-02-26 DIAGNOSIS — A419 Sepsis, unspecified organism: Principal | ICD-10-CM

## 2024-02-26 DIAGNOSIS — I4891 Unspecified atrial fibrillation: Secondary | ICD-10-CM

## 2024-02-26 DIAGNOSIS — E875 Hyperkalemia: Secondary | ICD-10-CM

## 2024-02-26 DIAGNOSIS — R6520 Severe sepsis without septic shock: Secondary | ICD-10-CM

## 2024-02-26 DIAGNOSIS — Z789 Other specified health status: Secondary | ICD-10-CM

## 2024-02-26 DIAGNOSIS — N179 Acute kidney failure, unspecified: Secondary | ICD-10-CM

## 2024-02-26 DIAGNOSIS — Z7189 Other specified counseling: Secondary | ICD-10-CM

## 2024-02-26 DIAGNOSIS — Z515 Encounter for palliative care: Secondary | ICD-10-CM

## 2024-02-26 LAB — BASIC METABOLIC PANEL WITH GFR
Anion gap: 12 (ref 5–15)
BUN: 31 mg/dL — ABNORMAL HIGH (ref 8–23)
CO2: 20 mmol/L — ABNORMAL LOW (ref 22–32)
Calcium: 7.9 mg/dL — ABNORMAL LOW (ref 8.9–10.3)
Chloride: 102 mmol/L (ref 98–111)
Creatinine, Ser: 1.24 mg/dL (ref 0.61–1.24)
GFR, Estimated: 55 mL/min — ABNORMAL LOW (ref >60)
Glucose, Bld: 132 mg/dL — ABNORMAL HIGH (ref 70–99)
Potassium: 4.6 mmol/L (ref 3.5–5.1)
Sodium: 134 mmol/L — ABNORMAL LOW (ref 135–145)

## 2024-02-26 LAB — CBC WITH DIFFERENTIAL/PLATELET
Abs Immature Granulocytes: 0.1 K/uL — ABNORMAL HIGH (ref 0.00–0.07)
Basophils Absolute: 0.1 K/uL (ref 0.0–0.1)
Basophils Relative: 1 %
Eosinophils Absolute: 0.2 K/uL (ref 0.0–0.5)
Eosinophils Relative: 2 %
HCT: 36.6 % — ABNORMAL LOW (ref 39.0–52.0)
Hemoglobin: 11.6 g/dL — ABNORMAL LOW (ref 13.0–17.0)
Immature Granulocytes: 1 %
Lymphocytes Relative: 11 %
Lymphs Abs: 1.4 K/uL (ref 0.7–4.0)
MCH: 27.4 pg (ref 26.0–34.0)
MCHC: 31.7 g/dL (ref 30.0–36.0)
MCV: 86.3 fL (ref 80.0–100.0)
Monocytes Absolute: 1.1 K/uL — ABNORMAL HIGH (ref 0.1–1.0)
Monocytes Relative: 9 %
Neutro Abs: 9.4 K/uL — ABNORMAL HIGH (ref 1.7–7.7)
Neutrophils Relative %: 76 %
Platelets: 304 K/uL (ref 150–400)
RBC: 4.24 MIL/uL (ref 4.22–5.81)
RDW: 16.9 % — ABNORMAL HIGH (ref 11.5–15.5)
WBC: 12.3 K/uL — ABNORMAL HIGH (ref 4.0–10.5)
nRBC: 0.4 % — ABNORMAL HIGH (ref 0.0–0.2)

## 2024-02-26 LAB — LACTIC ACID, PLASMA: Lactic Acid, Venous: 1.7 mmol/L (ref 0.5–1.9)

## 2024-02-26 LAB — GLUCOSE, CAPILLARY
Glucose-Capillary: 111 mg/dL — ABNORMAL HIGH (ref 70–99)
Glucose-Capillary: 134 mg/dL — ABNORMAL HIGH (ref 70–99)

## 2024-02-26 MED ORDER — GLYCOPYRROLATE 0.2 MG/ML IJ SOLN
0.2000 mg | INTRAMUSCULAR | Status: DC | PRN
Start: 2024-02-26 — End: 2024-02-27

## 2024-02-26 MED ORDER — ACETAMINOPHEN 325 MG PO TABS: 650.0000 mg | ORAL_TABLET | Freq: Four times a day (QID) | ORAL | Status: DC | PRN

## 2024-02-26 MED ORDER — GLYCOPYRROLATE 0.2 MG/ML IJ SOLN: 0.2000 mg | INTRAMUSCULAR | Status: DC | PRN

## 2024-02-26 MED ORDER — DIVALPROEX SODIUM 125 MG PO CSDR
125.0000 mg | DELAYED_RELEASE_CAPSULE | Freq: Three times a day (TID) | ORAL | Status: DC
  Filled 2024-02-26: qty 1

## 2024-02-26 MED ORDER — BIOTENE DRY MOUTH MT LIQD: 15.0000 mL | OROMUCOSAL | Status: DC | PRN

## 2024-02-26 MED ORDER — POLYVINYL ALCOHOL 1.4 % OP SOLN: 1.0000 [drp] | Freq: Four times a day (QID) | OPHTHALMIC | Status: DC | PRN

## 2024-02-26 MED ORDER — GLYCOPYRROLATE 1 MG PO TABS: 1.0000 mg | ORAL_TABLET | ORAL | Status: DC | PRN

## 2024-02-26 MED ORDER — LORAZEPAM 2 MG/ML PO CONC: 1.0000 mg | ORAL | Status: DC | PRN

## 2024-02-26 MED ORDER — MORPHINE SULFATE (PF) 2 MG/ML IV SOLN
1.0000 mg | INTRAVENOUS | Status: DC | PRN
  Administered 2024-02-26 – 2024-02-27 (×3): 2 mg via INTRAVENOUS
  Filled 2024-02-26 (×3): qty 1

## 2024-02-26 MED ORDER — ACETAMINOPHEN 650 MG RE SUPP
650.0000 mg | Freq: Four times a day (QID) | RECTAL | Status: DC | PRN
Start: 2024-02-26 — End: 2024-02-27

## 2024-02-26 MED ORDER — LORAZEPAM 2 MG/ML IJ SOLN
1.0000 mg | INTRAMUSCULAR | Status: DC | PRN
  Administered 2024-02-26 – 2024-02-27 (×5): 1 mg via INTRAVENOUS
  Filled 2024-02-26 (×5): qty 1

## 2024-02-26 MED ORDER — LORAZEPAM 1 MG PO TABS: 1.0000 mg | ORAL_TABLET | ORAL | Status: DC | PRN

## 2024-02-26 MED ORDER — MIDODRINE HCL 5 MG PO TABS: 5.0000 mg | ORAL_TABLET | ORAL | Status: DC | PRN

## 2024-02-26 MED ORDER — ASPIRIN 325 MG PO TABS
325.0000 mg | ORAL_TABLET | Freq: Every day | ORAL | Status: DC
  Administered 2024-02-26: 325 mg via ORAL
  Filled 2024-02-26: qty 1

## 2024-02-26 NOTE — Plan of Care (Signed)

## 2024-02-26 NOTE — Consult Note (Signed)
 Palliative Care Consult Note                                  Date: 02/26/2024   Patient Name: [name]  DOB: Dec 13, 1931  MRN: [id number]  Age / Sex: 89 y.o., male  PCP: Janey Santos, MD Referring Physician: Arlice Reichert, MD  Reason for Consultation: Establishing goals of care  Past Medical History:  Diagnosis Date   Allergic rhinitis    Anxiety    Arthritis    Benign localized prostatic hyperplasia with lower urinary tract symptoms (LUTS)    Chronic back pain    treated with epidural injections   Complication of anesthesia    delirium with infection post op back surgery 04-01-2008    H/O hiatal hernia    History of cardiac arrhythmia    event monitor 01-18-2016 in epic (per pt heart rate drops),  showed SR, SB, rare episodes intercalated PVCs with no evidence high grade ectopy or atrial fib   Hyperlipidemia    Hypertension    followed by pcp   Sciatica of right side    Type 2 diabetes mellitus (HCC)    followed by pcp  (03-04-2019  check's cbg 3 times per week,  fasting cbg 109-140)   Upper airway resistance syndrome    per cardiologist , dr burnard, note in [facility] 11-13-2018  dx from study done 2017 in epic    Urethral stricture     Subjective:   This NP Camellia Kays reviewed medical records, received report from team, assessed the patient and then meet at the patient's bedside to discuss diagnosis, prognosis, GOC, EOL wishes disposition and options.  Before meeting with the patient/family, I spent time reviewing the chart notes including admission H&P, ED notes, internal medicine note all from 02/24/2024; OT note from yesterday, internal medicine note from yesterday, TOC notes from yesterday, nursing note from today, TOC notes from today, hospitalist note from today. I also reviewed vital signs, nursing flowsheets, medication administrations record, labs, and imaging. Labs reviewed include CBC which shows progressive  improvement in leukocytosis from 18.9 on admission to 12.3 today at the setting of sepsis and multifocal pneumonia.  BMP shows improvement in creatinine from 0.74 on admission to 1.24 today in the setting of AKI with sepsis.  Lactic acid is improved from 2.9 on admission to normal/1.7 today in the setting of sepsis.  I met with the patient at bedside, no family was present.  I reached out to the patient's son [name] on the phone.   We meet to discuss diagnosis prognosis, GOC, EOL wishes, disposition and options. Concept of Palliative Care was introduced as specialized medical care for people and their families living with serious illness.  If focuses on providing relief from the symptoms and stress of a serious illness.  The goal is to improve quality of life for both the patient and the family. Values and goals of care important to patient and family were attempted to be elicited.  Created space and opportunity for patient  and family to explore thoughts and feelings regarding current medical situation   Natural trajectory and current clinical status were discussed. Questions and concerns addressed. Patient  encouraged to call with questions or concerns.    Patient/Family Understanding of Illness: The patient is unable to have meaningful communication.  His son understands that yesterday he was classified as an invalid and will never walk again.  He states that dementia has been coming and quickly, his heart is weaker and his blood pressure is low.  He has pneumonia.  Based on conversations he is having to his father multiple times in the past, including as recently as a couple weeks ago, he knows that he would not want to continue and to stay like this.  He is seeing progressive decline, worsening every 24 hours, and would want to be kept comfortable.  Spent time talking about further clinical details.  Patient Values: Quality of life over quantity of life  Baseline Status: The patient was  functionally independent and even driving until he fell October 05, 2023 and had to be admitted to the hospital for 5 days including surgery.  He went to [employer] and then discharged to long-term care for 24/7 care after that ([facility]  His son states that ever since his fall it has been a rapid decline since then.  He notes that in the past couple weeks he has not been able to recognize family including his own son.  He notes that his confusion is getting progressively worse and he fears that this is the being normal.  Today's Discussion: In addition to discussions described above we had extensive discussion on various topics.  We talked about his acute illness, his chronic illness, his rapid progression in confusion/dementia since his fall.  We talked about the nature of dementia that is progressive and irreversible.  He understands with acute illness, his father is likely having a rapid progression of his dementia.  He understands that while there is a possibility for some improvement, he has gotten progressively worse over the past several days in the hospital and he fears that this is a new normal.  He tells me of conversations that he is having to his father about how he would want to be cared for.  He states that his father told him do not let me lay in the hospital, just let me go peacefully if I ever get bad.  This conversation was 1 week ago.  The patient's son states that he knows that his father at this point has no further quality of life and has had a good and long life.  He would want to focus on comfort at this time.  We spent time talk about options moving forward and son states he does not think SNF/rehab is the right choice.  We talked about transition to comfort care. I explained comfort care as care where the patient would no longer receive aggressive medical interventions such as continuous vital signs, lab work, radiology testing, or medications not focused on  comfort, peace, and dignity. This includes stopping antibiotics and weaning oxygen  to room air, as these are generally not accepted as providing comfort but only prolonging the dying process artificially. All care would focus on how the patient is looking and feeling. This would include management of any symptoms that may cause discomfort, pain, shortness of breath/air hunger, increased work of breathing, cough, nausea, agitation/restlessness, anxiety, and/or secretions etc. Symptoms would be managed with medications and other non-pharmacological interventions such as spiritual support if requested, repositioning, music therapy, or therapeutic listening. Family verbalized understanding and agreement.   We talked about the option for engaging with hospice care as well. I described hospice as a service for patients who have a life expectancy of 6 months or less. The goal of hospice is the preservation of dignity and quality at the end phases of life.  Under hospice care, the focus changes from curative to symptom relief. I explained the three setting where hospice services can be provided including the home, at a living facility (such as LTC SNF, Assisted Living, etc), and a hospice facility. I explained that acceptance to hospice in any specific location is the final decision of the hospice medical director and bed availability, if applicable. They verbalized understanding.  At the end of this conversation the patient's son would like to transition to comfort care in the hospital.  He would like to engage with hospice and is elected [facility].  We talked about the different places that hospice services could be provided including at the home, at a long-term care services in place, or inpatient hospice with further qualifying criteria.  Son prefers admission to inpatient hospice at [facility] is his first choice.  His next choice would [facility] with  hospice services in place.  I provided the palliative medicine contact information and encouraged him to call with any questions or concerns while his father is admitted.  I shared that I would continue to check on his father daily while he is admitted on comfort care. I provided emotional and general support through therapeutic listening, empathy, sharing of stories, and other techniques. I answered all questions and addressed all concerns to the best of my ability.  Goals: DNR-comfort, transition to comfort care, engage with hospice for inpatient hospice versus hospice in place at long-term care.  ROS very limited due to mental status Review of Systems  Constitutional:        Admits pain in his side and back    Objective:   Primary Diagnoses: Present on Admission:  CAP (community acquired pneumonia)   Vital Signs:  BP 91/63 (BP Location: Left Arm)   Pulse 75   Temp 98.6 F (37 C) (Axillary)   Resp 18   Ht 5' 10.98 (1.803 m)   Wt 92.2 kg   SpO2 94%   BMI 28.36 kg/m   Physical Exam Vitals and nursing note reviewed.  Constitutional:      General: He is not in acute distress.    Appearance: He is ill-appearing.  HENT:     Head: Normocephalic and atraumatic.  Cardiovascular:     Rate and Rhythm: Normal rate.  Pulmonary:     Effort: Pulmonary effort is normal. No respiratory distress.  Abdominal:     General: Abdomen is flat.     Palpations: Abdomen is soft.  Skin:    General: Skin is warm and dry.  Neurological:     Mental Status: He is disoriented and confused.  Psychiatric:     Comments: Seems agitated, fidgety and pulling at lines     Palliative Assessment/Data: 20-30%   Advanced Care Planning:   Existing Vynca/ACP Documentation: None  Primary Decision Maker: NEXT OF KIN  Pertinent diagnosis: Multifocal pneumonia, sepsis, A-fib with RVR, acute metabolic encephalopathy, AKI on CKD  The patient and/or family consented to a voluntary Advance Care  Planning Conversation over the phone. Individuals present for the conversation: The patient's son [name]; Camellia Kays, NP  Summary of the conversation: We discussed his father's chronic illness history, recent decline, acute illness, current status, prognosis, CODE STATUS, options moving forward from full and aggressive care through comfort care and multiple points between.  Outcome of the conversations and/or documents completed: Changed to DNR-comfort, transition to comfort care, engage with [facility] for inpatient hospice versus hospice in  place at LTC.  I spent 20 minutes providing separately identifiable ACP services with the patient and/or surrogate decision maker in a voluntary, in-person conversation discussing the patient's wishes and goals as detailed in the above note.  Assessment & Plan:   HPI/Patient Profile: 89 y.o. male  with past medical history of DM2, HTN, HLD, OSA on CPAP, CKD, TIA, traumatic subdural hemorrhage, cognitive impairment, BPH.  He presented with complaints of shortness of breath, lethargy, low urine output, tachycardia at LTC.  He was admitted on 02/24/2024 with multifocal pneumonia, sepsis, acute metabolic encephalopathy, AKI on CKD, acute metabolic acidosis hyperkalemia/hypomagnesemia, A-fib with RVR, and others.    Palliative medicine was consulted for GOC conversations.  SUMMARY OF RECOMMENDATIONS   Changed to DNR-comfort Transition to comfort care See symptom management orders below [facility] consult for hospice referral Palliative medicine will continue to follow  Symptom Management:  Tylenol  650 mg PR every 6 hours as needed mild pain (1-3) or fever Biotene oral solution 15 mL topical as needed dry mouth Artificial tears 1 drop OU 4 times daily as needed dry eyes Robinul  0.2 mg IV every 4 hours as needed excessive secretions Ativan  1 mg IV every 4 hours as needed anxiety Morphine  1 to 4 mg IV every 15 minutes.  Severe pain (7-10),  signs/symptoms of distress Compazine 5 mg IV every 6 hours as needed nausea/vomiting Continue metoprolol 12.5 mg p.o. twice daily for tachycardia  Code Status: DNR - Comfort  Prognosis:  < 2 weeks  Discharge Planning:  IP hospice vs. LTC w/ hospice in place   Discussed with: Patient, family, medical team, nursing team    Thank you for allowing us  to participate in the care of OLLY SHINER PMT will continue to support holistically.  Billing based on MDM: High  Problems Addressed: One acute or chronic illness or injury that poses a threat to life or bodily function  Amount and/or Complexity of Data: Category 1:Review of prior external note(s) from each unique source, Review of the result(s) of each unique test, and Assessment requiring an independent historian(s) and Category 3:Discussion of management or test interpretation with external physician/other qualified health care professional/appropriate source (not separately reported)  Risks: Decision not to resuscitate or to de-escalate care because of poor prognosis  Detailed review of medical records (labs, imaging, vital signs), medically appropriate exam, discussed with treatment team, counseling and education to patient, family, & staff, documenting clinical information, medication management, coordination of care  Signed by: Camellia Kays, NP Palliative Medicine Team  Team Phone # [phone] (Nights/Weekends)  02/26/2024, 2:27 PM

## 2024-02-26 NOTE — Progress Notes (Signed)
 PROGRESS NOTE  [name]  DOB: 03-18-1932  PCP: Avva, Ravisankar, MD FMW:[id number]  DOA: 02/24/2024  LOS: 2 days  Hospital Day: 3  Subjective: Patient was seen and examined this a.m. Propped up in bed.  Not distressed a little more somnolent today.  Mumbles and unable to have a good conversation for me Afebrile, heart rate 90s, blood pressure in 90s and low 100s, breathing on 3 to 4 L oxygen  Labs this morning with WC count 12.3 lactic acid normal, BUN/creatinine 31/1.4  Brief narrative: [name] is a 89 y.o. male with PMH significant for DM2, HTN, HLD, OSA on CPAP, CKD, TIA, traumatic subdural hemorrhage, cognitive impairment, BPH. Last night, patient presented to the ED with complaint of shortness of breath, lethargy, low urine output progressively worsening for the last 3 days.  Home health RN noted tachycardia to 150s and hence called EMS.  In the ED, patient was confused, agitated, A-fib with RVR with heart rate in 100s afebrile, breathing on room air Initial lab with WBC count 18.7, lactic acid 2.9 >2.3 BMP with sodium 133, potassium 5.5, BUN/creatinine 43/1.74 Urinalysis with clear yellow urine with negative leukocytes, negative nitrite, rare bacteria Blood culture sent Chest x-ray with patchy bilateral mid and lower lung airspace opacities -edema versus infection, mild cardiomegaly with vascular congestion  Patient was given IV Rocephin, azithromycin.  Also had to be given Haldol for agitation. O2 sat dropped down to 87% and hence started on 2 L Admitted to [facility]  Assessment and plan: Sepsis - POA Multifocal pneumonia, POA Patient with 3 days of shortness of breath, lethargy, Met sepsis criteria with tachycardia, leukocytosis, lactic acidosis Blood culture sent.  No growth so far. Currently remains on IV Rocephin and doxycycline No fever.  WBC count improving.  Lactic acid level normalized this morning On low-flow oxygen  at 2 L this morning.  Wean down as  tolerated Recent Labs  Lab 02/24/24 0134 02/24/24 0141 02/24/24 0426 02/24/24 0636 02/24/24 0648 02/24/24 1912 02/25/24 0511 02/26/24 0418  WBC 18.7*  --   --  18.9*  --   --  13.2* 12.3*  LATICACIDVEN  --  2.9* 2.3*  --  2.7* 2.4*  --  1.7   Acute metabolic encephalopathy Was agitated in the ED and required a dose of Haldol Altered at presentation likely due to sepsis.  Given his age and presence of cerebrovascular risk factors, may have underlying propensity to vascular dementia.   Yesterday, mental status was much better.  This morning, patient is somnolent, only mumbles PTA meds- Seroquel 25 mg nightly, Depakote 125 mg 3 times daily today Seroquel remains continued.  Depakote on hold.  AKI on CKD 3A Acute metabolic acidosis Creatinine at baseline 1.13 from June 2025.  Patient with creatinine elevated to 1.74, secondary to sepsis.  Bicarb low at 18 probably due to lactic acidosis as well. With IV hydration, creatinine and bicarb level have improved.  Stop IV fluid today  Recent Labs    10/06/23 1525 10/07/23 0607 10/07/23 1123 10/08/23 0518 10/09/23 0550 10/10/23 0521 02/24/24 0321 02/25/24 0511 02/26/24 0418  BUN 28*  --  26* 28* 26* 18 43* 36* 31*  CREATININE 1.43* 1.26* 1.24 1.37* 1.16 1.13 1.74* 1.43* 1.24  CO2 22  --  22 24 23 26  18* 20* 20*   Hyperkalemia/hypomagnesemia Potassium was initially elevated, normalized with Lokelma  Magnesium supplementation was given on 11/3.   Recent Labs  Lab 02/24/24 0321 02/25/24 0511 02/26/24 0418  K 5.5*  4.6 4.6  MG  --  1.5*  --   PHOS  --  3.9  --    A-fib with RVR H/o paroxysmal A-fib PTA meds-not on any AV nodal blocking agent Because of persistently elevated heart rate over 100 bpm, I started him on metoprolol tartrate 12.5 mg twice daily on 11/3.  Heart rate improved since then. Not on anticoagulation due to history of subdural hematoma.  Seems to be an aspirin  325 mg daily  H/o hypotension It seems PTA,  patient was on midodrine 5 mg PRN.  Since he has been started on metoprolol for heart rate control, need to watch blood pressure fluctuation.  Resume midodrine as needed  Type 2 diabetes mellitus A1c 8.2 in June 2025 PTA meds-metformin , on hold currently Continue SSI/Accu-Cheks Recent Labs  Lab 02/25/24 1157 02/25/24 1557 02/25/24 2132 02/26/24 0814 02/26/24 1137  GLUCAP 126* 160* 149* 111* 134*    H/o TIA, Hyperlipidemia H/o traumatic SDH PTA meds- aspirin  325 mg p.o., Zocor  20 mg nightly Continue both.  Generalized weakness PT OT evaluation Fall precautions.  Mobility: PT recommended SNF  PT Orders: Active   PT Follow up Rec: Skilled Nursing-Short Term Rehab (<3 Hours/Day)02/24/2024 1100    Goals of care   Code Status: Limited: Do not attempt resuscitation (DNR) -DNR-LIMITED -Do Not Intubate/DNI .   Family requested palliative care consultation   DVT prophylaxis:  enoxaparin  (LOVENOX ) injection 40 mg Start: 02/24/24 1000   Antimicrobials: IV Rocephin Fluid: Stopped IV fluid Consultants: None Family Communication: Discussed with patient's son Mr. Thurman Sarver on the phone yesterday.  Status: Inpatient Level of care:  Telemetry   Patient is from: Home Needs to continue in-hospital care: Ongoing workup and management Anticipated d/c to: Pending clinical course, pending PT eval    Diet:  Diet Order             Diet Heart Room service appropriate? Yes; Fluid consistency: Thin  Diet effective now                   Scheduled Meds:  aspirin   325 mg Oral Daily   doxycycline  100 mg Oral Q12H   enoxaparin  (LOVENOX ) injection  40 mg Subcutaneous Q24H   insulin  aspart  0-5 Units Subcutaneous QHS   insulin  aspart  0-9 Units Subcutaneous TID WC   metoprolol tartrate  12.5 mg Oral BID   QUEtiapine  25 mg Oral QHS   simvastatin   20 mg Oral QHS    PRN meds: acetaminophen , guaiFENesin-dextromethorphan, ipratropium-albuterol, melatonin, midodrine,  polyethylene glycol, prochlorperazine   Infusions:   cefTRIAXone (ROCEPHIN)  IV Stopped (02/25/24 2104)    Antimicrobials: Anti-infectives (From admission, onward)    Start     Dose/Rate Route Frequency Ordered Stop   02/24/24 2200  cefTRIAXone (ROCEPHIN) 2 g in sodium chloride  0.9 % 100 mL IVPB        2 g 200 mL/hr over 30 Minutes Intravenous Every 24 hours 02/24/24 0505     02/24/24 1800  azithromycin (ZITHROMAX) 500 mg in sodium chloride  0.9 % 250 mL IVPB  Status:  Discontinued        500 mg 250 mL/hr over 60 Minutes Intravenous Every 24 hours 02/24/24 0505 02/24/24 0753   02/24/24 1000  doxycycline (VIBRA-TABS) tablet 100 mg        100 mg Oral Every 12 hours 02/24/24 0753     02/24/24 0500  azithromycin (ZITHROMAX) 500 mg in sodium chloride  0.9 % 250 mL IVPB  500 mg 250 mL/hr over 60 Minutes Intravenous  Once 02/24/24 0445 02/24/24 0758   02/24/24 0200  cefTRIAXone (ROCEPHIN) 1 g in sodium chloride  0.9 % 100 mL IVPB        1 g 200 mL/hr over 30 Minutes Intravenous  Once 02/24/24 0154 02/24/24 0218       Objective: Vitals:   02/26/24 1154 02/26/24 1358  BP: 95/66 91/63  Pulse: 81 75  Resp: 16 18  Temp: (!) 97.5 F (36.4 C) 98.6 F (37 C)  SpO2: 94% 94%    Intake/Output Summary (Last 24 hours) at 02/26/2024 1402 Last data filed at 02/26/2024 1156 Gross per 24 hour  Intake 1512.98 ml  Output 1025 ml  Net 487.98 ml   Filed Weights   02/24/24 0042  Weight: 92.2 kg   Weight change:  Body mass index is 28.36 kg/m.   Physical Exam: General exam: Pleasant, elderly Caucasian male.  Not in distress at the time of my eval. Skin: No rashes, lesions or ulcers. HEENT: Atraumatic, normocephalic, no obvious bleeding Lungs: Clear to auscultation bilaterally,  CVS: S1, S2, no murmur,   GI/Abd: Soft, nontender, nondistended, bowel sound present,   CNS: Somnolent, opens eyes on command.  Mumbles  psychiatry: Sad affect Extremities: No pedal edema, no calf  tenderness,   Data Review: I have personally reviewed the laboratory data and studies available.  F/u labs ordered Unresulted Labs (From admission, onward)     Start     Ordered   03/02/24 0500  Creatinine, serum  (enoxaparin  (LOVENOX )    CrCl >/= 30 ml/min)  Weekly,   R     Comments: while on enoxaparin  therapy    02/24/24 0502            Signed, Chapman Rota, MD [facility] 02/26/2024

## 2024-02-26 NOTE — Plan of Care (Signed)
  Problem: Education: Goal: Ability to describe self-care measures that may prevent or decrease complications (Diabetes Survival Skills Education) will improve Outcome: Not Applicable Goal: Individualized Educational Video(s) Outcome: Not Applicable   Problem: Coping: Goal: Ability to adjust to condition or change in health will improve Outcome: Not Applicable  Patient is comfort care

## 2024-02-26 NOTE — TOC Progression Note (Addendum)
 Transition of Care [facility]) - Progression Note    Patient Details  Name: [name] MRN: [id number] Date of Birth: 06-Jan-1932  [facility] CM/SW Contact  Isaiah Public, LCSWA Phone Number: 02/26/2024, 12:44 PM  Clinical Narrative:     CSW followed up with patients son [name] to discuss patients SNF bed offers. Patients son request for CSW to follow back up with him once he is able to speak with palliative today. All questions answered. No further questions reported at this time. CSW will continue to follow.  Update- CSW received consult from Palliative NP for residential hospice referral for patient. CSW spoke with patients son [name] who gave CSW permission to make referral to [facility] for [facility] place for patient. CSW made referral to [facility] with [facility]. CSW will continue to follow.  Expected Discharge Plan: Skilled Nursing Facility Barriers to Discharge: Continued Medical Work up               Expected Discharge Plan and Services In-house Referral: Clinical Social Work     Living arrangements for the past 2 months: Single Family Home                                       Social Drivers of Health (SDOH) Interventions SDOH Screenings   Food Insecurity: No Food Insecurity (10/08/2023)  Housing: Low Risk  (10/08/2023)  Transportation Needs: No Transportation Needs (10/08/2023)  Utilities: Not At Risk (10/08/2023)  Social Connections: Moderately Integrated (10/08/2023)  Tobacco Use: Medium Risk (02/24/2024)    Readmission Risk Interventions     No data to display

## 2024-02-27 DIAGNOSIS — J189 Pneumonia, unspecified organism: Secondary | ICD-10-CM | POA: Diagnosis not present

## 2024-02-27 MED ORDER — GLYCOPYRROLATE 0.2 MG/ML IJ SOLN: 0.2000 mg | INTRAMUSCULAR | Status: DC | PRN

## 2024-02-27 MED ORDER — POLYETHYLENE GLYCOL 3350 17 G PO PACK: 17.0000 g | PACK | Freq: Every day | ORAL | Status: DC | PRN

## 2024-02-27 MED ORDER — BIOTENE DRY MOUTH MT LIQD: 15.0000 mL | OROMUCOSAL | Status: DC | PRN

## 2024-02-27 MED ORDER — PROCHLORPERAZINE EDISYLATE 10 MG/2ML IJ SOLN: 5.0000 mg | Freq: Four times a day (QID) | INTRAMUSCULAR | Status: DC | PRN

## 2024-02-27 MED ORDER — MORPHINE SULFATE (PF) 2 MG/ML IV SOLN: 1.0000 mg | INTRAVENOUS | Status: DC | PRN

## 2024-02-27 MED ORDER — LORAZEPAM 2 MG/ML IJ SOLN: 1.0000 mg | INTRAMUSCULAR | Status: DC | PRN

## 2024-02-27 MED ORDER — GLYCOPYRROLATE 1 MG PO TABS: 1.0000 mg | ORAL_TABLET | ORAL | Status: DC | PRN

## 2024-02-27 MED ORDER — LORAZEPAM 2 MG/ML PO CONC: 1.0000 mg | ORAL | Status: DC | PRN

## 2024-02-27 MED ORDER — LORAZEPAM 1 MG PO TABS: 1.0000 mg | ORAL_TABLET | ORAL | Status: DC | PRN

## 2024-02-27 MED ORDER — POLYVINYL ALCOHOL 1.4 % OP SOLN: 1.0000 [drp] | Freq: Four times a day (QID) | OPHTHALMIC | Status: DC | PRN

## 2024-02-27 NOTE — Progress Notes (Signed)
 [facility] Liaison Note  Received a request from [facility] for family interest in [facility].  Eligibility confirmed. Met and spoke with family to confirm interest and explain services.   Family is agreeable to transfer this afternoon. ICM aware.   RN please call report to [phone] prior to patient leaving the unit. Please send signed and completed DNR with patient at discharge.  Please call with any questions or concerns.  Thank you for the opportunity to participate in this patient's care.  Inocente Jacobs, BSN, RN [employer] [phone]

## 2024-02-27 NOTE — TOC Progression Note (Addendum)
 Transition of Care [facility]) - Progression Note    Patient Details  Name: DARRELL HAUK MRN: [id number] Date of Birth: 1931/07/12  Transition of Care [facility]) CM/SW Contact  Isaiah Public, LCSWA Phone Number: 02/27/2024, 10:48 AM  Clinical Narrative:     CSW made referral yesterday to [facility] with [facility] for [facility] for patient. CSW followed up with Melissa and Inocente with [facility]. Inocente with [facility] informed CSW that she plans on assessing patient shortly, and that she has spoken to patients son. CSW will continue to follow.  Update- Inocente with [facility] informed CSW that patients eligibility was confirmed for [facility] place. Inocente informed medical team that facility has a bed today for patient. Inocente informed CSW she will follow up once paperwork is completed by patients son, so that we can prepare patient for transfer. CSW will continue to follow.    Expected Discharge Plan: Skilled Nursing Facility Barriers to Discharge: Continued Medical Work up               Expected Discharge Plan and Services In-house Referral: Clinical Social Work     Living arrangements for the past 2 months: Single Family Home                                       Social Drivers of Health (SDOH) Interventions SDOH Screenings   Food Insecurity: No Food Insecurity (10/08/2023)  Housing: Low Risk  (10/08/2023)  Transportation Needs: No Transportation Needs (10/08/2023)  Utilities: Not At Risk (10/08/2023)  Social Connections: Moderately Integrated (10/08/2023)  Tobacco Use: Medium Risk (02/24/2024)    Readmission Risk Interventions     No data to display

## 2024-02-27 NOTE — Progress Notes (Signed)
 Daily Progress Note   Date: 02/27/2024   Patient Name: [name]  DOB: [birth year]  MRN: [id number]  Age / Sex: 89 y.o., male  Attending Physician: Arlice Reichert, MD Primary Care Physician: Janey Santos, MD Admit Date: 02/24/2024 Length of Stay: 3 days  Reason for Follow-up: Establishing goals of care, Inpatient hospice referral, Non pain symptom management, Pain control, Psychosocial/spiritual support, and Terminal Care  Past Medical History:  Diagnosis Date   Allergic rhinitis    Anxiety    Arthritis    Benign localized prostatic hyperplasia with lower urinary tract symptoms (LUTS)    Chronic back pain    treated with epidural injections   Complication of anesthesia    delirium with infection post op back surgery 04-01-2008    H/O hiatal hernia    History of cardiac arrhythmia    event monitor 01-18-2016 in epic (per pt heart rate drops),  showed SR, SB, rare episodes intercalated PVCs with no evidence high grade ectopy or atrial fib   Hyperlipidemia    Hypertension    followed by pcp   Sciatica of right side    Type 2 diabetes mellitus (HCC)    followed by pcp  (03-04-2019  check's cbg 3 times per week,  fasting cbg 109-140)   Upper airway resistance syndrome    per cardiologist , dr burnard, note in [facility] 11-13-2018  dx from study done 2017 in epic    Urethral stricture     Subjective:   Subjective: Chart Reviewed. Updates received. Patient Assessed. Created space and opportunity for patient  and family to explore thoughts and feelings regarding current medical situation.  Today's Discussion: Today before meeting with the patient/family, I reviewed the chart notes including nursing history, TOC from today. I also reviewed vital signs, nursing flowsheets, and medication administrations record. No labs due to comfort care status.  Vital signs today include temperature 97.6, heart rate 50, respiratory rate 16, blood pressure 83/55, satting 92% on 2 L/min nasal  cannula.  Comfort medications administered include Ativan  at 8:51 PM and 10:41 PM yesterday as well as 4:35 AM at 11:04 AM today.  Also required morphine  at 11:09 PM yesterday at 9:58 AM today.  Will plan to continue parenteral controlled substances due to need and anticipated need.  Today saw the patient at bedside, 2 family members were present.  Introduced himself as a museum/gallery conservator and her husband.  Noted that hospice is already been in to evaluate the patient and are working on approval.  They tell me that the nurse just gave the patient a dose of medication because he was quite anxious which seemed to have helped him.  At this time he appears comfortable, sleeping/snoring with respirations even and unlabored and no objective signs of distress such as grimacing, furrowing of the brow.  I encouraged family to speak up to the nurse if any objective signs of discomfort or distress.  After seeing the patient I spoke with the bedside nurse who indicates that hospice seemed to indicate they may be able to except the patient today.  Later in today I received a message from the patient's daughter-in-law [name] son).  I reached out we had a conversation about the relationship between [name] and the patient's stepdaughter.  At this time [name] is requesting that all communication go through him or his wife Holley and not be shared with other family members.  Per their request I reached out to the medical team at hospice to notify them  of this request.  I provided emotional and general support through therapeutic listening, empathy, sharing of stories, and other techniques. I answered all questions and addressed all concerns to the best of my ability.  Review of Systems  Unable to perform ROS   Objective:   Primary Diagnoses: Present on Admission:  CAP (community acquired pneumonia)   Vital Signs:  BP (!) 83/55 (BP Location: Left Arm)   Pulse (!) 50   Temp 97.6 F (36.4 C) (Axillary)   Resp  18   Ht 5' 10.98 (1.803 m)   Wt 92.2 kg   SpO2 92%   BMI 28.36 kg/m   Physical Exam Vitals and nursing note reviewed.  Constitutional:      General: He is sleeping. He is not in acute distress.    Appearance: He is ill-appearing. He is not toxic-appearing.     Comments: No objective signs of distress or discomfort  HENT:     Head: Normocephalic and atraumatic.  Pulmonary:     Effort: Pulmonary effort is normal. No respiratory distress.     Comments: Respirations even and unlabored Abdominal:     General: Abdomen is flat.     Palliative Assessment/Data: 10%   Existing Vynca/ACP Documentation: None  Assessment & Plan:   HPI/Patient Profile:  89 y.o. male  with past medical history of DM2, HTN, HLD, OSA on CPAP, CKD, TIA, traumatic subdural hemorrhage, cognitive impairment, BPH.  He presented with complaints of shortness of breath, lethargy, low urine output, tachycardia at LTC.  He was admitted on 02/24/2024 with multifocal pneumonia, sepsis, acute metabolic encephalopathy, AKI on CKD, acute metabolic acidosis hyperkalemia/hypomagnesemia, A-fib with RVR, and others.     Palliative medicine was consulted for GOC conversations.  SUMMARY OF RECOMMENDATIONS   DNR-comfort Continue comfort care See symptom management orders below DO NOT SHARE INFORMATION WITH FAMILY OTHER THAN THE PATIENT'S SON [name] AND HIS WIFE [name] Palliative medicine will continue to follow  Symptom Management:  Tylenol  650 mg PR every 6 hours as needed mild pain (1-3) or fever Biotene oral solution 15 mL topical as needed dry mouth Artificial tears 1 drop OU 4 times daily as needed dry eyes Robinul  0.2 mg IV every 4 hours as needed excessive secretions Ativan  1 mg IV every 4 hours as needed anxiety Morphine  1 to 4 mg IV every 15 minutes.  Severe pain (7-10), signs/symptoms of distress Compazine 5 mg IV every 6 hours as needed nausea/vomiting Continue metoprolol 12.5 mg p.o. twice daily for  tachycardia  Code Status: DNR - Comfort  Prognosis: Hours - Days  Discharge Planning: Hospice facility  Discussed with: Patient's family, medical team, nursing team, [facility] team, hospice liaison  Thank you for allowing us  to participate in the care of JOSHUWA VECCHIO PMT will continue to support holistically.  Billing based on MDM: High  Problems Addressed: One acute or chronic illness or injury that poses a threat to life or bodily function  Risks: Parenteral controlled substances  Detailed review of medical records (labs, imaging, vital signs), medically appropriate exam, discussed with treatment team, counseling and education to patient, family, & staff, documenting clinical information, medication management, coordination of care  Camellia Kays, NP Palliative Medicine Team  Team Phone # [phone] (Nights/Weekends)  12/21/2020, 8:17 AM

## 2024-02-27 NOTE — TOC Transition Note (Signed)
 Transition of Care [facility] - Discharge Note   Patient Details  Name: [name] MRN: [id number] Date of Birth: May 07, 1931  Transition [facility] Care [facility]) CM/SW Contact:  Isaiah Public, LCSWA Phone Number: 02/27/2024, 2:02 PM   Clinical Narrative:     Patient will DC to: [facility]  Anticipated DC date: 02/27/2024  Family notified: [name]  Transport by: [facility]  ?  Per MD patient ready for DC to [facility] . RN, patient, patient's family Inocente with [facility], and facility notified of DC. RN given number for report [phone]. DC packet on chart. DNR signed by MD attached to patients DC packet.Ambulance transport requested for patient.  CSW signing off.   Final next level of care: Hospice Medical Facility [facility]) Barriers to Discharge: No Barriers Identified   Patient Goals and CMS Choice Patient states their goals for this hospitalization and ongoing recovery are:: SNF CMS Medicare.gov Compare Post Acute Care list provided to:: Patient Choice offered to / list presented to : Adult Children Quinten Son)      Discharge Placement              Patient chooses bed at:  [facility]) Patient to be transferred to facility by: [facility] Name of family member notified: [name] Patient and family notified of of transfer: 02/27/24  Discharge Plan and Services Additional resources added to the After Visit Summary for   In-house Referral: Clinical Social Work                                   Social Drivers of Health (SDOH) Interventions SDOH Screenings   Food Insecurity: No Food Insecurity (10/08/2023)  Housing: Low Risk  (10/08/2023)  Transportation Needs: No Transportation Needs (10/08/2023)  Utilities: Not At Risk (10/08/2023)  Social Connections: Moderately Integrated (10/08/2023)  Tobacco Use: Medium Risk (02/24/2024)     Readmission Risk Interventions     No data to display

## 2024-02-27 NOTE — Discharge Summary (Signed)
 Physician Discharge Summary  [name] FMW:[id number] DOB: April 26, 1931 DOA: 02/24/2024  PCP: Avva, Ravisankar, MD  Admit date: 02/24/2024 Discharge date: 02/27/2024  Admitted from: Home Discharge disposition: Hospice   Subjective: Patient was seen and examined this a.m. Lying on bed.  On low-flow oxygen .  Somnolent.  Comfortable.  No family at bedside Seen by palliative care yesterday and underwent discussion with family.  Per decision by family, patient was switched to DNR/comfort care with a plan of hospice care initiation.  Afebrile, heart rate in 50s, blood pressure in 80s this morning  Brief narrative: [name] is a 89 y.o. male with PMH significant for DM2, HTN, HLD, OSA on CPAP, CKD, TIA, traumatic subdural hemorrhage, cognitive impairment, BPH. Last night, patient presented to the ED with complaint of shortness of breath, lethargy, low urine output progressively worsening for the last 3 days.  Home health RN noted tachycardia to 150s and hence called EMS.  In the ED, patient was confused, agitated, A-fib with RVR with heart rate in 100s afebrile, breathing on room air Initial lab with WBC count 18.7, lactic acid 2.9 >2.3 BMP with sodium 133, potassium 5.5, BUN/creatinine 43/1.74 Urinalysis with clear yellow urine with negative leukocytes, negative nitrite, rare bacteria Blood culture sent Chest x-ray with patchy bilateral mid and lower lung airspace opacities -edema versus infection, mild cardiomegaly with vascular congestion  Patient was given IV Rocephin, azithromycin.  Also had to be given Haldol for agitation. O2 sat dropped down to 87% and hence started on 2 L Admitted to [facility]  Hospital course: Comfort care status Patient was initially started on treatment for sepsis secondary to multifocal pneumonia, metabolic encephalopathy, AKI. As per family's request, palliative care was involved on 11/4.  Per decision by family with palliative care, patient was then  switched to DNR/comfort care. Plan of hospice at discharge. Comfort care order set with as needed meds in As of this morning, patient is comfortably somnolent.  Not in distress. Case management aware  Other medical issues currently not being actively managed Multifocal pneumonia Acute metabolic encephalopathy AKI on CKD 3A Acute metabolic acidosis paroxysmal A-fib Hypotension Type 2 diabetes mellitus H/o TIA, Hyperlipidemia H/o traumatic SDH Generalized weakness   Goals of care   Code Status: Do not attempt resuscitation (DNR) - Comfort care.     Consultants: Palliative care Family Communication: Discussed with patient's son Mr. Sebastiano Luecke on the phone .   Diet:  Diet Order             Diet general           Diet Heart Room service appropriate? Yes; Fluid consistency: Thin  Diet effective now                   Nutritional status:  Body mass index is 28.36 kg/m.       Wounds:  -    Discharge Medications:   Allergies as of 02/27/2024   No Known Allergies      Medication List     STOP taking these medications    aspirin  325 MG tablet Commonly known as: Bayer Aspirin    ciprofloxacin 500 MG tablet Commonly known as: CIPRO   divalproex 125 MG capsule Commonly known as: DEPAKOTE SPRINKLE   docusate sodium  100 MG capsule Commonly known as: COLACE   metFORMIN  500 MG tablet Commonly known as: GLUCOPHAGE    midodrine 5 MG tablet Commonly known as: PROAMATINE   Myrbetriq  50 MG Tb24 tablet Generic drug: mirabegron   ER   simvastatin  20 MG tablet Commonly known as: ZOCOR        TAKE these medications    acetaminophen  500 MG tablet Commonly known as: TYLENOL  Take 500 mg by mouth 2 (two) times daily as needed for moderate pain (pain score 4-6), fever or headache.   antiseptic oral rinse Liqd Apply 15 mLs topically as needed for dry mouth.   artificial tears ophthalmic solution Place 1 drop into both eyes 4 (four) times daily as needed  for dry eyes.   glycopyrrolate  1 MG tablet Commonly known as: ROBINUL  Take 1 tablet (1 mg total) by mouth every 4 (four) hours as needed (excessive secretions).   glycopyrrolate  0.2 MG/ML injection Commonly known as: ROBINUL  Inject 1 mL (0.2 mg total) into the skin every 4 (four) hours as needed (excessive secretions).   glycopyrrolate  0.2 MG/ML injection Commonly known as: ROBINUL  Inject 1 mL (0.2 mg total) into the vein every 4 (four) hours as needed (excessive secretions).   HYDROcodone -acetaminophen  5-325 MG tablet Commonly known as: NORCO/VICODIN Take 1 tablet by mouth every 6 (six) hours as needed (for post op pain).   LORazepam  1 MG tablet Commonly known as: ATIVAN  Take 1 tablet (1 mg total) by mouth every 4 (four) hours as needed for anxiety.   LORazepam  2 MG/ML concentrated solution Commonly known as: ATIVAN  Place 0.5 mLs (1 mg total) under the tongue every 4 (four) hours as needed for anxiety.   LORazepam  2 MG/ML injection Commonly known as: ATIVAN  Inject 0.5 mLs (1 mg total) into the vein every 4 (four) hours as needed for anxiety.   morphine  (PF) 2 MG/ML injection Inject 0.5-2 mLs (1-4 mg total) into the vein every 15 (fifteen) minutes as needed (To alleviate signs and symptoms of distress).   polyethylene glycol 17 g packet Commonly known as: MIRALAX / GLYCOLAX Take 17 g by mouth daily as needed for mild constipation.   prochlorperazine 10 MG/2ML injection Commonly known as: COMPAZINE Inject 1 mL (5 mg total) into the vein every 6 (six) hours as needed.   QUEtiapine 25 MG tablet Commonly known as: SEROQUEL Take 25-50 mg by mouth See admin instructions. Take 25 mg by mouth in the morning and 50 mg by mouth at bedtime.         Follow ups:    Follow-up Information     Avva, Ravisankar, MD Follow up.   Specialty: Internal Medicine Contact information: [address] [place] [address] [phone]                 Discharge  Instructions:   Discharge Instructions     Activity as tolerated - No restrictions   Complete by: As directed    Call MD for:   Complete by: As directed    Please get in touch with hospice MD/nurse for any symptom control.   Diet general   Complete by: As directed    If patient is alert and awake enough to eat, can allow luxury feeding.   Discharge instructions   Complete by: As directed    Medicines intended for comfort and pain management prescribed. Rest of the care per hospice policy.       Discharge Exam:   Vitals:   02/26/24 1358 02/26/24 1500 02/26/24 2000 02/27/24 1209  BP: 91/63 94/62 (!) 83/55 111/75  Pulse: 75  (!) 50 86  Resp: 18  18 18   Temp: 98.6 F (37 C) 98.3 F (36.8 C) 97.6 F (36.4 C) (!) 96.6 F (  35.9 C)  TempSrc: Axillary Axillary Axillary Axillary  SpO2: 94%  92% 92%  Weight:      Height:        Body mass index is 28.36 kg/m.  General exam: Pleasant, elderly Caucasian male.  Not in distress at the time of my eval. Because of comfort care status, I did not do a detailed exam   The results of significant diagnostics from this hospitalization (including imaging, microbiology, ancillary and laboratory) are listed below for reference.    Procedures and Diagnostic Studies:   DG Chest Port 1 View Result Date: 02/24/2024 EXAM: 1 VIEW XRAY OF THE CHEST 02/24/2024 01:05:22 AM COMPARISON: 10/06/2023 CLINICAL HISTORY: Questionable sepsis - evaluate for abnormality FINDINGS: LUNGS AND PLEURA: Patchy bilateral mid and lower lung airspace opacities. This could reflect edema or infection. No pleural effusion. No pneumothorax. HEART AND MEDIASTINUM: Mild cardiomegaly, vascular congestion. BONES AND SOFT TISSUES: No acute osseous abnormality. IMPRESSION: 1. Patchy bilateral mid and lower lung airspace opacities, possibly representing edema or infection. 2. Mild cardiomegaly with vascular congestion. Electronically signed by: Franky Crease MD 02/24/2024 01:13 AM EDT  RP Workstation: [id number]     Labs:   Basic Metabolic Panel: Recent Labs  Lab 02/24/24 0321 02/25/24 0511 02/26/24 0418  NA 133* 135 134*  K 5.5* 4.6 4.6  CL 98 101 102  CO2 18* 20* 20*  GLUCOSE 105* 109* 132*  BUN 43* 36* 31*  CREATININE 1.74* 1.43* 1.24  CALCIUM 7.3* 7.6* 7.9*  MG  --  1.5*  --   PHOS  --  3.9  --    GFR Estimated Creatinine Clearance: 46 mL/min (by C-G formula based on SCr of 1.24 mg/dL). Liver Function Tests: Recent Labs  Lab 02/24/24 0321 02/25/24 0511  AST 40 26  ALT 56* 43  ALKPHOS 69 65  BILITOT 0.6 0.7  PROT 5.3* 5.2*  ALBUMIN 2.1* 2.0*   No results for input(s): LIPASE, AMYLASE in the last 168 hours. No results for input(s): AMMONIA in the last 168 hours. Coagulation profile Recent Labs  Lab 02/24/24 0134  INR 1.2    CBC: Recent Labs  Lab 02/24/24 0134 02/24/24 0636 02/25/24 0511 02/26/24 0418  WBC 18.7* 18.9* 13.2* 12.3*  NEUTROABS 15.9*  --   --  9.4*  HGB 14.3 13.4 11.3* 11.6*  HCT 45.6 42.3 35.6* 36.6*  MCV 87.2 86.5 85.4 86.3  PLT 301 324 290 304   Cardiac Enzymes: No results for input(s): CKTOTAL, CKMB, CKMBINDEX, TROPONINI in the last 168 hours. BNP: Invalid input(s): POCBNP CBG: Recent Labs  Lab 02/25/24 1157 02/25/24 1557 02/25/24 2132 02/26/24 0814 02/26/24 1137  GLUCAP 126* 160* 149* 111* 134*   D-Dimer No results for input(s): DDIMER in the last 72 hours. Hgb A1c No results for input(s): HGBA1C in the last 72 hours. Lipid Profile No results for input(s): CHOL, HDL, LDLCALC, TRIG, CHOLHDL, LDLDIRECT in the last 72 hours. Thyroid  function studies No results for input(s): TSH, T4TOTAL, T3FREE, THYROIDAB in the last 72 hours.  Invalid input(s): FREET3 Anemia work up No results for input(s): VITAMINB12, FOLATE, FERRITIN, TIBC, IRON, RETICCTPCT in the last 72 hours. Microbiology Recent Results (from the past 240 hours)  Blood Culture (routine  x 2)     Status: None (Preliminary result)   Collection Time: 02/24/24 12:45 AM   Specimen: BLOOD RIGHT HAND  Result Value Ref Range Status   Specimen Description BLOOD RIGHT HAND  Final   Special Requests   Final    BOTTLES  DRAWN AEROBIC AND ANAEROBIC Blood Culture results may not be optimal due to an inadequate volume of blood received in culture bottles   Culture   Final    NO GROWTH 3 DAYS Performed at [facility], [address] [place], [place] [address]    Report Status PENDING  Incomplete  Resp panel by RT-PCR (RSV, Flu A&B, Covid) Anterior Nasal Swab     Status: None   Collection Time: 02/24/24 12:46 AM   Specimen: Anterior Nasal Swab  Result Value Ref Range Status   SARS Coronavirus 2 by RT PCR NEGATIVE NEGATIVE Final   Influenza A by PCR NEGATIVE NEGATIVE Final   Influenza B by PCR NEGATIVE NEGATIVE Final    Comment: (NOTE) The Xpert Xpress SARS-CoV-2/FLU/RSV plus assay is intended as an aid in the diagnosis of influenza from Nasopharyngeal swab specimens and should not be used as a sole basis for treatment. Nasal washings and aspirates are unacceptable for Xpert Xpress SARS-CoV-2/FLU/RSV testing.  Fact Sheet for Patients: bloggercourse.com  Fact Sheet for Healthcare Providers: seriousbroker.it  This test is not yet approved or cleared by the [place]  FDA and has been authorized for detection and/or diagnosis of SARS-CoV-2 by FDA under an Emergency Use Authorization (EUA). This EUA will remain in effect (meaning this test can be used) for the duration of the COVID-19 declaration under Section 564(b)(1) of the Act, 21 U.S.C. section 360bbb-3(b)(1), unless the authorization is terminated or revoked.     Resp Syncytial Virus by PCR NEGATIVE NEGATIVE Final    Comment: (NOTE) Fact Sheet for Patients: bloggercourse.com  Fact Sheet for Healthcare  Providers: seriousbroker.it  This test is not yet approved or cleared by the [place]  FDA and has been authorized for detection and/or diagnosis of SARS-CoV-2 by FDA under an Emergency Use Authorization (EUA). This EUA will remain in effect (meaning this test can be used) for the duration of the COVID-19 declaration under Section 564(b)(1) of the Act, 21 U.S.C. section 360bbb-3(b)(1), unless the authorization is terminated or revoked.  Performed at [facility], [address], [place], [place] [address]   Blood Culture (routine x 2)     Status: None (Preliminary result)   Collection Time: 02/24/24 12:50 AM   Specimen: BLOOD RIGHT FOREARM  Result Value Ref Range Status   Specimen Description BLOOD RIGHT FOREARM  Final   Special Requests   Final    BOTTLES DRAWN AEROBIC AND ANAEROBIC Blood Culture results may not be optimal due to an inadequate volume of blood received in culture bottles   Culture   Final    NO GROWTH 3 DAYS Performed at [facility], [address], [place], [place] [address]    Report Status PENDING  Incomplete    Time coordinating discharge: 45 minutes  Signed: Aggie Douse  [facility] 02/27/2024, 1:34 PM

## 2024-02-29 LAB — CULTURE, BLOOD (ROUTINE X 2)
Culture: NO GROWTH
Culture: NO GROWTH

## 2024-03-24 DEATH — deceased
